# Patient Record
Sex: Male | Born: 1955 | Race: White | Hispanic: No | Smoking: Current every day smoker
Health system: Southern US, Community
[De-identification: ages and names within clinical notes are randomized; demographics above are authoritative.]

## PROBLEM LIST (undated history)

## (undated) DIAGNOSIS — E119 Type 2 diabetes mellitus without complications: Secondary | ICD-10-CM

## (undated) DIAGNOSIS — E7800 Pure hypercholesterolemia, unspecified: Secondary | ICD-10-CM

## (undated) DIAGNOSIS — F419 Anxiety disorder, unspecified: Secondary | ICD-10-CM

## (undated) DIAGNOSIS — I10 Essential (primary) hypertension: Secondary | ICD-10-CM

## (undated) DIAGNOSIS — M1990 Unspecified osteoarthritis, unspecified site: Secondary | ICD-10-CM

## (undated) DIAGNOSIS — I714 Abdominal aortic aneurysm, without rupture, unspecified: Secondary | ICD-10-CM

## (undated) DIAGNOSIS — N289 Disorder of kidney and ureter, unspecified: Secondary | ICD-10-CM

## (undated) DIAGNOSIS — J449 Chronic obstructive pulmonary disease, unspecified: Secondary | ICD-10-CM

## (undated) DIAGNOSIS — N200 Calculus of kidney: Secondary | ICD-10-CM

## (undated) DIAGNOSIS — Z8489 Family history of other specified conditions: Secondary | ICD-10-CM

## (undated) HISTORY — DX: Type 2 diabetes mellitus without complications: E11.9

## (undated) HISTORY — DX: Abdominal aortic aneurysm, without rupture, unspecified: I71.40

## (undated) HISTORY — PX: OTHER SURGICAL HISTORY: SHX169

---

## 2001-03-17 ENCOUNTER — Emergency Department (HOSPITAL_COMMUNITY): Admission: EM | Admit: 2001-03-17 | Discharge: 2001-03-17 | Payer: Self-pay

## 2001-08-16 ENCOUNTER — Emergency Department (HOSPITAL_COMMUNITY): Admission: EM | Admit: 2001-08-16 | Discharge: 2001-08-16 | Payer: Self-pay | Admitting: Emergency Medicine

## 2002-09-11 ENCOUNTER — Encounter: Payer: Self-pay | Admitting: Emergency Medicine

## 2002-09-11 ENCOUNTER — Emergency Department (HOSPITAL_COMMUNITY): Admission: EM | Admit: 2002-09-11 | Discharge: 2002-09-11 | Payer: Self-pay | Admitting: Emergency Medicine

## 2002-10-08 ENCOUNTER — Emergency Department (HOSPITAL_COMMUNITY): Admission: EM | Admit: 2002-10-08 | Discharge: 2002-10-08 | Payer: Self-pay | Admitting: Emergency Medicine

## 2002-10-29 ENCOUNTER — Emergency Department (HOSPITAL_COMMUNITY): Admission: EM | Admit: 2002-10-29 | Discharge: 2002-10-29 | Payer: Self-pay | Admitting: *Deleted

## 2002-10-29 ENCOUNTER — Encounter: Payer: Self-pay | Admitting: Emergency Medicine

## 2003-08-24 ENCOUNTER — Inpatient Hospital Stay (HOSPITAL_COMMUNITY): Admission: EM | Admit: 2003-08-24 | Discharge: 2003-09-01 | Payer: Self-pay | Admitting: Psychiatry

## 2004-03-05 ENCOUNTER — Emergency Department (HOSPITAL_COMMUNITY): Admission: EM | Admit: 2004-03-05 | Discharge: 2004-03-05 | Payer: Self-pay | Admitting: *Deleted

## 2004-08-31 ENCOUNTER — Emergency Department (HOSPITAL_COMMUNITY): Admission: EM | Admit: 2004-08-31 | Discharge: 2004-08-31 | Payer: Self-pay | Admitting: *Deleted

## 2005-01-08 ENCOUNTER — Emergency Department (HOSPITAL_COMMUNITY): Admission: EM | Admit: 2005-01-08 | Discharge: 2005-01-08 | Payer: Self-pay | Admitting: Emergency Medicine

## 2005-11-06 ENCOUNTER — Emergency Department (HOSPITAL_COMMUNITY): Admission: EM | Admit: 2005-11-06 | Discharge: 2005-11-07 | Payer: Self-pay | Admitting: Emergency Medicine

## 2006-09-22 ENCOUNTER — Emergency Department (HOSPITAL_COMMUNITY): Admission: EM | Admit: 2006-09-22 | Discharge: 2006-09-22 | Payer: Self-pay | Admitting: Emergency Medicine

## 2006-09-23 ENCOUNTER — Emergency Department (HOSPITAL_COMMUNITY): Admission: EM | Admit: 2006-09-23 | Discharge: 2006-09-23 | Payer: Self-pay | Admitting: Emergency Medicine

## 2006-10-04 ENCOUNTER — Emergency Department (HOSPITAL_COMMUNITY): Admission: EM | Admit: 2006-10-04 | Discharge: 2006-10-04 | Payer: Self-pay | Admitting: Emergency Medicine

## 2006-12-04 ENCOUNTER — Emergency Department (HOSPITAL_COMMUNITY): Admission: EM | Admit: 2006-12-04 | Discharge: 2006-12-04 | Payer: Self-pay | Admitting: Emergency Medicine

## 2007-07-10 ENCOUNTER — Emergency Department (HOSPITAL_COMMUNITY): Admission: EM | Admit: 2007-07-10 | Discharge: 2007-07-10 | Payer: Self-pay | Admitting: Emergency Medicine

## 2007-10-09 ENCOUNTER — Ambulatory Visit: Payer: Self-pay | Admitting: Cardiology

## 2007-10-09 ENCOUNTER — Inpatient Hospital Stay (HOSPITAL_COMMUNITY): Admission: EM | Admit: 2007-10-09 | Discharge: 2007-10-11 | Payer: Self-pay | Admitting: Family Medicine

## 2007-10-10 ENCOUNTER — Encounter (INDEPENDENT_AMBULATORY_CARE_PROVIDER_SITE_OTHER): Payer: Self-pay | Admitting: Emergency Medicine

## 2007-10-21 ENCOUNTER — Ambulatory Visit: Payer: Self-pay | Admitting: Family Medicine

## 2007-12-09 ENCOUNTER — Ambulatory Visit: Payer: Self-pay | Admitting: Internal Medicine

## 2007-12-09 ENCOUNTER — Ambulatory Visit: Payer: Self-pay | Admitting: Family Medicine

## 2007-12-09 LAB — CONVERTED CEMR LAB
AST: 13 units/L (ref 0–37)
Alkaline Phosphatase: 51 units/L (ref 39–117)
BUN: 15 mg/dL (ref 6–23)
Basophils Relative: 1 % (ref 0–1)
Creatinine, Ser: 0.89 mg/dL (ref 0.40–1.50)
Eosinophils Absolute: 0.5 10*3/uL (ref 0.0–0.7)
Eosinophils Relative: 5 % (ref 0–5)
HCT: 46.2 % (ref 39.0–52.0)
HDL: 41 mg/dL (ref >39)
Hemoglobin: 15.3 g/dL (ref 13.0–17.0)
LDL Cholesterol: 110 mg/dL — ABNORMAL HIGH (ref 0–99)
MCHC: 33.1 g/dL (ref 30.0–36.0)
MCV: 92 fL (ref 78.0–100.0)
Monocytes Absolute: 0.9 10*3/uL (ref 0.1–1.0)
Monocytes Relative: 11 % (ref 3–12)
RBC: 5.02 M/uL (ref 4.22–5.81)
TSH: 1.682 microintl units/mL (ref 0.350–4.50)
Total CHOL/HDL Ratio: 4.6
Triglycerides: 189 mg/dL — ABNORMAL HIGH (ref <150)

## 2008-04-13 ENCOUNTER — Emergency Department (HOSPITAL_COMMUNITY): Admission: EM | Admit: 2008-04-13 | Discharge: 2008-04-13 | Payer: Self-pay | Admitting: Emergency Medicine

## 2008-08-07 ENCOUNTER — Emergency Department (HOSPITAL_COMMUNITY): Admission: EM | Admit: 2008-08-07 | Discharge: 2008-08-07 | Payer: Self-pay | Admitting: Emergency Medicine

## 2008-08-08 ENCOUNTER — Emergency Department (HOSPITAL_COMMUNITY): Admission: EM | Admit: 2008-08-08 | Discharge: 2008-08-08 | Payer: Self-pay | Admitting: Emergency Medicine

## 2009-12-16 ENCOUNTER — Emergency Department (HOSPITAL_COMMUNITY): Admission: EM | Admit: 2009-12-16 | Discharge: 2009-12-16 | Payer: Self-pay | Admitting: Emergency Medicine

## 2009-12-20 ENCOUNTER — Emergency Department (HOSPITAL_COMMUNITY): Admission: EM | Admit: 2009-12-20 | Discharge: 2009-12-20 | Payer: Self-pay | Admitting: Emergency Medicine

## 2009-12-28 ENCOUNTER — Emergency Department (HOSPITAL_COMMUNITY): Admission: EM | Admit: 2009-12-28 | Discharge: 2009-12-28 | Payer: Self-pay | Admitting: Emergency Medicine

## 2010-07-22 LAB — POCT I-STAT, CHEM 8
BUN: 11 mg/dL (ref 6–23)
Calcium, Ion: 1.19 mmol/L (ref 1.12–1.32)
Chloride: 105 mEq/L (ref 96–112)
Creatinine, Ser: 0.8 mg/dL (ref 0.4–1.5)
Sodium: 137 mEq/L (ref 135–145)
TCO2: 26 mmol/L (ref 0–100)

## 2010-08-27 ENCOUNTER — Emergency Department (HOSPITAL_COMMUNITY)
Admission: EM | Admit: 2010-08-27 | Discharge: 2010-08-27 | Disposition: A | Discharge status: Home / Self Care | Payer: Self-pay | Attending: Emergency Medicine | Admitting: Emergency Medicine

## 2010-08-27 ENCOUNTER — Emergency Department (HOSPITAL_COMMUNITY): Payer: Self-pay

## 2010-08-27 DIAGNOSIS — N201 Calculus of ureter: Secondary | ICD-10-CM | POA: Insufficient documentation

## 2010-08-27 DIAGNOSIS — R109 Unspecified abdominal pain: Secondary | ICD-10-CM | POA: Insufficient documentation

## 2010-08-27 DIAGNOSIS — I10 Essential (primary) hypertension: Secondary | ICD-10-CM | POA: Insufficient documentation

## 2010-08-27 DIAGNOSIS — E7800 Pure hypercholesterolemia, unspecified: Secondary | ICD-10-CM | POA: Insufficient documentation

## 2010-08-27 DIAGNOSIS — J449 Chronic obstructive pulmonary disease, unspecified: Secondary | ICD-10-CM | POA: Insufficient documentation

## 2010-08-27 DIAGNOSIS — J4489 Other specified chronic obstructive pulmonary disease: Secondary | ICD-10-CM | POA: Insufficient documentation

## 2010-08-27 DIAGNOSIS — Z87442 Personal history of urinary calculi: Secondary | ICD-10-CM | POA: Insufficient documentation

## 2010-08-27 LAB — URINALYSIS, ROUTINE W REFLEX MICROSCOPIC
Glucose, UA: NEGATIVE mg/dL
Leukocytes, UA: NEGATIVE
Protein, ur: NEGATIVE mg/dL
pH: 5.5 (ref 5.0–8.0)

## 2010-08-27 LAB — URINE MICROSCOPIC-ADD ON

## 2010-08-30 ENCOUNTER — Emergency Department (HOSPITAL_COMMUNITY): Payer: Self-pay

## 2010-08-30 ENCOUNTER — Observation Stay (HOSPITAL_COMMUNITY)
Admission: EM | Admit: 2010-08-30 | Discharge: 2010-08-31 | Disposition: A | Discharge status: Home / Self Care | Payer: Self-pay | Attending: Urology | Admitting: Urology

## 2010-08-30 DIAGNOSIS — N201 Calculus of ureter: Principal | ICD-10-CM | POA: Insufficient documentation

## 2010-08-30 DIAGNOSIS — K5900 Constipation, unspecified: Secondary | ICD-10-CM | POA: Insufficient documentation

## 2010-08-30 DIAGNOSIS — I517 Cardiomegaly: Secondary | ICD-10-CM | POA: Insufficient documentation

## 2010-08-30 DIAGNOSIS — R110 Nausea: Secondary | ICD-10-CM | POA: Insufficient documentation

## 2010-08-30 DIAGNOSIS — E7800 Pure hypercholesterolemia, unspecified: Secondary | ICD-10-CM | POA: Insufficient documentation

## 2010-08-30 DIAGNOSIS — N1330 Unspecified hydronephrosis: Secondary | ICD-10-CM | POA: Insufficient documentation

## 2010-08-30 DIAGNOSIS — Z79899 Other long term (current) drug therapy: Secondary | ICD-10-CM | POA: Insufficient documentation

## 2010-08-30 DIAGNOSIS — R109 Unspecified abdominal pain: Secondary | ICD-10-CM | POA: Insufficient documentation

## 2010-08-30 DIAGNOSIS — N135 Crossing vessel and stricture of ureter without hydronephrosis: Secondary | ICD-10-CM | POA: Insufficient documentation

## 2010-08-30 DIAGNOSIS — I10 Essential (primary) hypertension: Secondary | ICD-10-CM | POA: Insufficient documentation

## 2010-08-30 LAB — DIFFERENTIAL
Basophils Absolute: 0.1 10*3/uL (ref 0.0–0.1)
Basophils Relative: 0 % (ref 0–1)
Lymphocytes Relative: 27 % (ref 12–46)
Monocytes Absolute: 2 10*3/uL — ABNORMAL HIGH (ref 0.1–1.0)
Neutro Abs: 7.8 10*3/uL — ABNORMAL HIGH (ref 1.7–7.7)
Neutrophils Relative %: 57 % (ref 43–77)

## 2010-08-30 LAB — SURGICAL PCR SCREEN: MRSA, PCR: NEGATIVE

## 2010-08-30 LAB — GLUCOSE, CAPILLARY: Glucose-Capillary: 96 mg/dL (ref 70–99)

## 2010-08-30 LAB — BASIC METABOLIC PANEL
CO2: 24 mEq/L (ref 19–32)
Calcium: 9.6 mg/dL (ref 8.4–10.5)
Chloride: 98 mEq/L (ref 96–112)
GFR calc Af Amer: 57 mL/min — ABNORMAL LOW (ref >60)
Glucose, Bld: 104 mg/dL — ABNORMAL HIGH (ref 70–99)
Potassium: 4 mEq/L (ref 3.5–5.1)
Sodium: 134 mEq/L — ABNORMAL LOW (ref 135–145)

## 2010-08-30 LAB — CBC
HCT: 42.7 % (ref 39.0–52.0)
Hemoglobin: 14.9 g/dL (ref 13.0–17.0)
RBC: 4.85 MIL/uL (ref 4.22–5.81)
WBC: 13.9 10*3/uL — ABNORMAL HIGH (ref 4.0–10.5)

## 2010-08-31 LAB — BASIC METABOLIC PANEL
BUN: 22 mg/dL (ref 6–23)
CO2: 27 mEq/L (ref 19–32)
Calcium: 9 mg/dL (ref 8.4–10.5)
Chloride: 104 mEq/L (ref 96–112)
Creatinine, Ser: 1.58 mg/dL — ABNORMAL HIGH (ref 0.4–1.5)
GFR calc Af Amer: 55 mL/min — ABNORMAL LOW (ref >60)
GFR calc non Af Amer: 46 mL/min — ABNORMAL LOW (ref >60)
Glucose, Bld: 110 mg/dL — ABNORMAL HIGH (ref 70–99)
Potassium: 4.6 mEq/L (ref 3.5–5.1)
Sodium: 138 mEq/L (ref 135–145)

## 2010-08-31 LAB — HEMOGLOBIN AND HEMATOCRIT, BLOOD
HCT: 40.1 % (ref 39.0–52.0)
Hemoglobin: 13.7 g/dL (ref 13.0–17.0)

## 2010-09-06 NOTE — Op Note (Signed)
NAME:  Dennis Dominguez, Dennis Dominguez             ACCOUNT NO.:  [id number]  MEDICAL RECORD NO.:  [id number]           PATIENT TYPE:  O  LOCATION:  1401                         FACILITY:  [facility]  PHYSICIAN:  Bertram Millard. Greysen Swanton, M.D.DATE OF BIRTH:  08/08/1955  DATE OF PROCEDURE:  08/30/2010 DATE OF DISCHARGE:                              OPERATIVE REPORT   PREOPERATIVE DIAGNOSIS:  Proximal left ureteral stone, 8 mm in size.  POSTOPERATIVE DIAGNOSES:  Proximal left ureteral stone, 8 mm in size.  PRINCIPAL PROCEDURES:  Cysto, left retrograde ureteropyelogram, attempted left ureteroscopy, double-J stent placed (6 x 26 contour without string).  SURGEON:  Bertram Millard. Elois Averitt, M.D.  ANESTHESIA:  General.  COMPLICATIONS:  None.  BRIEF HISTORY:  A 55 year old male who presented to the emergency room with recurring pain in his left flank.  CT of his abdomen revealed a proximal left ureteral stone, 8 mm in size with mild hydro.  As thepatient has recurrent pain, it was recommended that he undergo treatment for this.  He was admitted to the hospital for pain management, he subsequently presents at this time for his attempted stone extraction. Risks and complications have been discussed with the patient.  DESCRIPTION OF PROCEDURE:  The patient was identified in the holding area and received preoperative IV Cipro on the floor.  He was taken to the operating room where general anesthetic was administered.  He was placed in the dorsal lithotomy position.  Genitalia and perineum were prepped and draped.  Time-out was then performed.  22-French panendoscope was then passed through the urethra, which was normal.  Prostate was not obstructive.  Bladder was inspected circumferentially.  No tumors, trabeculations, or foreign bodies were seen.  The left ureter was cannulated with a 6-French open-ended catheter and retrograde performed.  Retrograde revealed a normal ureter except for a filling defect in  the proximal ureter at the level of L3 or L4.  There was mild hydronephrosis proximal to this.  A guidewire was then placed by this.  I attempted to pass ureteroscope after the cystoscope was removed.  I was unable to enter the ureter.  I then tried dilating the ureter with inner core of an access sheath, 12-French in size.  I was unable to get the access sheath past the ureteral orifice.  I tried multiple times and using fluoroscopic guidance, it was evident that I would not be able to dilate the ureter.  At this point, I then stop the attempts at dilating and then placed a 6-French x 26 cm contour stent.  I thought at this point it would be worthwhile either performing lithotripsy down the road or performing repeat ureteroscopy once the ureter has been dilated.  Good curls were seen proximally and distally on the stent when the guidewire was removed.  The bladder was drained.  The procedure terminated.  The patient tolerated procedure well.  He was awakened and taken to PACU in stable condition.     Bertram Millard. Retta Diones, M.D.     SMD/MEDQ  D:  08/30/2010  T:  08/31/2010  Job:  [id number]  Electronically Signed by Marcine Matar M.D. on 09/06/2010 12:00:34  PM

## 2010-09-06 NOTE — Discharge Summary (Signed)
  NAME:  Dennis Dominguez, Dennis Dominguez             ACCOUNT NO.:  [id number]  MEDICAL RECORD NO.:  [id number]           PATIENT TYPE:  O  LOCATION:  1401                         FACILITY:  [facility]  PHYSICIAN:  Bertram Millard. Treasure Ingrum, M.D.DATE OF BIRTH:  [birth year]  DATE OF ADMISSION:  08/30/2010 DATE OF DISCHARGE:  08/31/2010                              DISCHARGE SUMMARY   PRINCIPAL PROCEDURE:  August 30, 2010, cysto, attempted left ureteroscopy, double-J stent placement on the left.  OTHER DIAGNOSES:  Hypercholesterolemia, possible history of hypertension.  BRIEF HISTORY:  This is a 55 year old male who was admitted through the emergency room due to significant recurrent pain from a kidney stone on the left.  He has an 8-mm proximal left ureteral stone.  He was unable to be treated as an outpatient with pain medicine.  He was admitted for pain management and possible stone management.  For the admission data, please see the dictated admission note.  He did have an EKG which showed normal sinus rhythm.  Chest x-ray revealed stable mild cardiomegaly with stable chronic bronchitis and/or asthma.  HOSPITAL COURSE:  The patient was admitted to my service.  He, on the day of admission, underwent cystoscopy, left retrograde ureteropyelogram, attempted left ureteroscopy.  He had a significant narrowing of the left distal ureter which precluded passage of a scope or ureteral dilatation.  I then placed a 6-French double-J stent.  It was felt that the patient will need followup ureteroscopy after the stent passively dilates his ureter.  He did well postoperatively and was discharged on postoperative day #1.  At that time, he was discharged on Vicodin 1 p.o. q.4 h p.r.n. pain, Ditropan 1 p.o. q.8 h p.r.n. urinary frequency, and Cipro 250 mg 1 p.o. b.i.d. for 3 days.  It was recommended that he call my office within the next week for followup and to schedule his procedure.     Bertram Millard. Retta Diones,  M.D.     SMD/MEDQ  D:  08/31/2010  T:  08/31/2010  Job:  [id number]  Electronically Signed by Marcine Matar M.D. on 09/06/2010 12:00:40 PM

## 2010-09-06 NOTE — Consult Note (Signed)
NAME:  Dennis Dominguez, Dennis Dominguez             ACCOUNT NO.:  [id number]  MEDICAL RECORD NO.:  [id number]           PATIENT TYPE:  O  LOCATION:  1401                         FACILITY:  [facility]  PHYSICIAN:  Bertram Millard. Azariah Bonura, M.D.DATE OF BIRTH:  12-Aug-1955  DATE OF CONSULTATION:  08/30/2010 DATE OF DISCHARGE:                                CONSULTATION   REASON FOR CONSULTATION:  Left flank pain and left abdominal pain secondary to obstructing left mid ureteral 8-mm calculus.  HISTORY OF PRESENT ILLNESS:  This is a 55 year old gentleman who began having pain approximately 4 days ago.  His pain was unrelenting therefore he presented to the Emergency Room for pain control.  He was discharged home with pain medications at that time.  He was diagnosed with an obstructing 8-mm left mid ureteral calculus.  His pain waxed and waned over the course of the next 4 days.  This pain began increasing in intensity this a.m. without control with his narcotic pain medications. It was also accompanied with nausea and fever and chills.  He denies any vomiting or diarrhea.  He does complain of occasional urinary frequency and occasional nocturia.  He denies any complaints of dysuria or hematuria.  He denies any complaints of chest pain or shortness of breath.  He does complain of constipation secondary to his narcotic pain medications.  PAST MEDICAL HISTORY: 1. Hypercholesterolemia. 2. Hypertension. 3. Nephrolithiasis.  PAST SURGICAL HISTORY:  There is none.  MEDICATIONS: 1. Zocor 80 mg once daily. 2. Percocet 5/325 as needed for pain.  ALLERGIES:  PENICILLIN with reaction of swelling and hives.  FAMILY HISTORY:  He denies any family history of kidney cancer, bladder cancer or prostate cancer.  He does have a family history of nephrolithiasis with his father and hypercholesterolemia with his father.  SOCIAL HISTORY:  He lives alone in [place].  He denies any alcohol use.  He does use tobacco and  occasional cannabis.  His last use of cannabis was approximately 3 days ago.  REVIEW OF SYSTEMS:  As stated per HPI.  Positive complaints of left flank pain and left abdominal pain, occasional fever and chills, nausea and urinary frequency as well as constipation.  He denies any vomiting, diarrhea, chest pain or shortness of breath.  He denies any dysuria or hematuria.  PHYSICAL EXAMINATION:  VITAL SIGNS:  Temperature 98.2, pulse 90, respirations 70, blood pressure 119/84. CONSTITUTIONAL:  He is a well-developed, well-nourished obese white male, in no acute distress although somewhat anxious. HEENT:  Normocephalic, atraumatic.  Oropharynx is clear. CARDIOVASCULAR:  Regular rate and rhythm. LUNGS:  Clear to auscultation. ABDOMEN:  Mild left lower and upper quadrant tenderness with left CVA tenderness.  Positive bowel sounds x4. EXTREMITIES:  Nontender.  No atrophy NEUROLOGIC:  His remote and recent memory is intact. SKIN:  Warm, dry and intact.  RADIOLOGY:  A CT of abdomen and pelvis from August 30, 2010 showed: 1. 8-mm obstructing calculus in mid left ureter at the L4-L5 disk     space. 2. Mild left hydroureteronephrosis. 3. Mild perinephric stranding.  LABORATORY DATA:  Sodium is 134, potassium 4.0, chloride 98, CO2 of 24, BUN  13, creatinine 1.54, glucose is 104.  WBC is 13.9, hemoglobin is 14.9, hematocrit 42.7, platelets 383,000.  Urinalysis from August 27, 2010, specific gravity is 1.015, pH 5.5, blood small amount.  The rest of the dipstick negative and urine micro from August 27, 2010 shows WBCs of 0-2.  IMPRESSION AND PLAN: 1. Hydroureteronephrosis secondary to obstructing left mid ureteral 8     mm calculus. 2. Mild perinephric stranding.  I will plan to admit patient for     surgical procedure this afternoon for Dr. Marcine Matar     consisting of cystoscopy, left retrograde pyelography with     placement of left double-J stent.  We will keep him overnight for      intravenous hydration and antibiotic therapy.  Will possibly be     discharged home in a.m. with good outcome of procedure today.  We     will at that time have him follow up for definitive stone treatment     on an outpatient basis.     Delia Chimes, NP   ______________________________ Bertram Millard. Joncarlo Friberg, M.D.    MA/MEDQ  D:  08/30/2010  T:  08/30/2010  Job:  [id number]  Electronically Signed by Delia Chimes NP on 09/01/2010 11:12:21 AM Electronically Signed by Marcine Matar M.D. on 09/06/2010 11:59:42 AM

## 2010-09-12 ENCOUNTER — Other Ambulatory Visit: Payer: Self-pay | Admitting: Urology

## 2010-09-12 ENCOUNTER — Encounter (HOSPITAL_COMMUNITY): Payer: Self-pay | Attending: Urology

## 2010-09-12 DIAGNOSIS — K219 Gastro-esophageal reflux disease without esophagitis: Secondary | ICD-10-CM | POA: Insufficient documentation

## 2010-09-12 DIAGNOSIS — I10 Essential (primary) hypertension: Secondary | ICD-10-CM | POA: Insufficient documentation

## 2010-09-12 DIAGNOSIS — G4733 Obstructive sleep apnea (adult) (pediatric): Secondary | ICD-10-CM | POA: Insufficient documentation

## 2010-09-12 DIAGNOSIS — N201 Calculus of ureter: Secondary | ICD-10-CM | POA: Insufficient documentation

## 2010-09-12 DIAGNOSIS — Z01812 Encounter for preprocedural laboratory examination: Secondary | ICD-10-CM | POA: Insufficient documentation

## 2010-09-12 DIAGNOSIS — Z79899 Other long term (current) drug therapy: Secondary | ICD-10-CM | POA: Insufficient documentation

## 2010-09-12 LAB — CBC
Hemoglobin: 14.6 g/dL (ref 13.0–17.0)
MCH: 30.6 pg (ref 26.0–34.0)
MCHC: 34.8 g/dL (ref 30.0–36.0)
Platelets: 468 10*3/uL — ABNORMAL HIGH (ref 150–400)
RBC: 4.77 MIL/uL (ref 4.22–5.81)

## 2010-09-12 LAB — BASIC METABOLIC PANEL
BUN: 15 mg/dL (ref 6–23)
CO2: 23 mEq/L (ref 19–32)
GFR calc Af Amer: >60 mL/min (ref >60)
GFR calc non Af Amer: >60 mL/min (ref >60)
Glucose, Bld: 84 mg/dL (ref 70–99)
Sodium: 135 mEq/L (ref 135–145)

## 2010-09-16 ENCOUNTER — Ambulatory Visit (HOSPITAL_COMMUNITY)
Admission: RE | Admit: 2010-09-16 | Discharge: 2010-09-17 | Disposition: A | Discharge status: Home / Self Care | Payer: Self-pay | Source: Ambulatory Visit | Attending: Urology | Admitting: Urology

## 2010-09-16 DIAGNOSIS — I10 Essential (primary) hypertension: Secondary | ICD-10-CM | POA: Insufficient documentation

## 2010-09-16 DIAGNOSIS — G4733 Obstructive sleep apnea (adult) (pediatric): Secondary | ICD-10-CM | POA: Insufficient documentation

## 2010-09-16 DIAGNOSIS — N201 Calculus of ureter: Secondary | ICD-10-CM | POA: Insufficient documentation

## 2010-09-16 DIAGNOSIS — Z79899 Other long term (current) drug therapy: Secondary | ICD-10-CM | POA: Insufficient documentation

## 2010-09-16 DIAGNOSIS — F17200 Nicotine dependence, unspecified, uncomplicated: Secondary | ICD-10-CM | POA: Insufficient documentation

## 2010-09-16 DIAGNOSIS — E7800 Pure hypercholesterolemia, unspecified: Secondary | ICD-10-CM | POA: Insufficient documentation

## 2010-09-16 DIAGNOSIS — K219 Gastro-esophageal reflux disease without esophagitis: Secondary | ICD-10-CM | POA: Insufficient documentation

## 2010-09-20 NOTE — Discharge Summary (Signed)
NAME:  Dennis Dominguez, Dennis Dominguez NO.:  [id number]   MEDICAL RECORD NO.:  [id number]          PATIENT TYPE:  INP   LOCATION:  4704                         FACILITY:  [facility]   PHYSICIAN:  Michaelyn Barter, M.D. DATE OF BIRTH:  1955-07-22   DATE OF ADMISSION:  10/09/2007  DATE OF DISCHARGE:  10/11/2007                               DISCHARGE SUMMARY   FINAL DIAGNOSES:  1. Chest pain.  2. Hyperlipidemia.  3. Hypertension.   CONSULTATIONS:  Cardiology with [place].   PROCEDURES:  1. Two-D echocardiogram completed October 11, 2007.  2. Two view chest x-ray completed October 09, 2007.   HISTORY OF PRESENT ILLNESS:  Mr. Bury is a 55 year old gentleman who  indicated that over the last 3 months he had been having heaviness over  the left side of his chest.  He also complained of some centrally  located sharp chest pain.  He indicated that when he has the heavy  sensation over the left side of his chest it is occasionally accompanied  by left forearm discomfort.  He also has some occasional shortness of  breath.  Over the past 3 months his chest pressure has been feeling as  though it is progressing.  He has become progressively more short of  breath with ambulation particularly when walking up stairs or an  incline.   PAST MEDICAL HISTORY:  For past medical history please see that dictated  by Dr. Michaelyn Barter.   HOSPITAL COURSE:  1. Chest pain.  A chest x-ray was completed on June 3.  It revealed      mild cardiomegaly.  Chronic lung changes, and nrdro active disease.      The patient's EKG revealed sinus tachycardia with only questionable      changes in V1.  A repeat EKG completed June 3rd showed only      questionable findings.  Because of the patient's history on      presentation [facility] was consulted.  Dr. Charlies Constable      saw the patient.  A 2-D echo was completed on October 10, 2007, the      overall left ventricular systolic function was normal.  Left   ventricular EF was estimated to range between 55 and 60%.  No      diagnostic evidence of left ventricular regional wall motion      abnormalities were seen.  [facility] evaluated the patient      and their final impression was that no further cardiac workup      needed to take place.  The patient has not complained of any chest      pain or shortness of breath throughout his hospital course.  2. Hyperlipidemia.  A fasting lipid profile was completed on June 4.      The patient's cholesterol was noted to be 174.  His triglycerides      were 322.  His LDL was 86.  Zocor was started.  3. Homelessness.  Social work has been consulted with regards to this.      The patient has indicated that he does have a  facility or shelter      to return to once he is discharged from the hospital.  The decision      has been made to discharge the patient from the hospital.   DISCHARGE MEDICATIONS:  1. His medications at the time of discharge will consist of aspirin 81      mg daily.  2. Zocor 20 mg daily.  3. Albuterol inhaler 2 puffs q.6 h p.r.n.  4. Ambien 5 mg p.o. daily at bedtime p.r.n.      Michaelyn Barter, M.D.  Electronically Signed     OR/MEDQ  D:  10/11/2007  T:  10/11/2007  Job:  [id number]

## 2010-09-20 NOTE — H&P (Signed)
NAME:  Dennis Dominguez NO.:  [id number]   MEDICAL RECORD NO.:  [id number]          PATIENT TYPE:  INP   LOCATION:  1832                         FACILITY:  [facility]   PHYSICIAN:  Michaelyn Barter, M.D. DATE OF BIRTH:  March 04, 1956   DATE OF ADMISSION:  10/09/2007  DATE OF DISCHARGE:                              HISTORY & PHYSICAL   The patient's primary care doctor is unassigned.   CHIEF COMPLAINT:  Chest pain.   HISTORY OF PRESENT ILLNESS:  Mr. Dennis Dominguez is a 55 year old homeless  gentleman who indicates that for at least 3 months he has been having  heaviness over the left side of his chest.  He also complains of some  centrally located sharp chest pain.  He indicated that when he has the  heavy sensation over the left side of his chest occasionally it is  accompanied by left forearm discomfort.  He also has some occasional  shortness of breath.  He states that over the past 3 months his chest  pressure has been feeling as though it is getting worse.  He also  indicates that he has become progressively more short of breath with  ambulation, particularly when walking up stairs or up an incline over  the past 3 months.  There are no triggers.  Again he complains in  addition to having left chest pressure centrally located sharp pains  which he states have reached up to a 6/10 in intensity.  He denies there  being any triggers for the pressure or the pain.  There are no  alleviating factors identified.  The  pressure and the pain can occur  off and on every day.  There has been no diaphoresis, but he has had  some nausea.  No fevers or chills.   PAST MEDICAL HISTORY:  1. Depression.  2. History of suicidal ideation.  3. An admission into [facility] April 2005.   SURGICAL HISTORY:  None.   ALLERGIES:  PENICILLIN CAUSED THE PATIENT TO HAVE GENERALIZED SWELLING  AS WELL AS SHORTNESS OF BREATH.   HOME MEDICATIONS:  None.   SOCIAL HISTORY:  1. Cigarettes.   The patient started smoking at the age of 74.  He      smoked up to 2 packs cigarettes per day.  He indicates that over      the past year he has been able to decrease the number of cigarettes      smoked to 1 pack per day.  2. Alcohol.  The patient admits to having a history of alcohol abuse      that occurred greater than 2 years ago.  3. Crack cocaine.  The patient openly admits to having used crack      cocaine in the past.  He states that the last time he used crack      cocaine was back in 1999.  4. Acid.  The patient openly admits to using acid back in the 1970s.  5. Marijuana.  The patient states that he has occasionally smoked      marijuana.  He also openly admits to being homeless  currently.   FAMILY HISTORY:  Father had angina and renal failure.  Mother had a CVA,  heart disease.  Paternal grandfather had an MI.   REVIEW OF SYSTEMS:  As per HPI.   PHYSICAL EXAMINATION:  GENERAL:  The patient is awake.  He is  cooperative.  He is in no obvious respiratory distress.  VITAL SIGNS:  Temperature 97.7, blood pressure 150/93, heart rate 80,  respirations 24, O2 sat 97%.  HEENT:  Normocephalic, atraumatic.  Anicteric.  Extraocular movements  are intact.  Oral mucosa is pink.  No thrush, no exudates.  NECK:  No JVD, no lymphadenopathy, no thyromegaly.  Supple.  CARDIAC:  S1 and S2 are present.  Regular rate and rhythm.  No murmurs,  gallops or rubs auscultated.  No parasternal heave.  PMI is difficult to  appreciate.  ABDOMEN:  Soft, nontender, nondistended.  No masses palpated.  Bowel  sounds are present although somewhat hypoactive.  EXTREMITIES:  Trace bilateral leg edema.  NEUROLOGIC:  The patient is alert and oriented x3.  MUSCULOSKELETAL:  There is 5/5 upper and lower extremity strength.   LABORATORIES:  Cardiac markers, CK-MB, POC 1.2, troponin I, POC less  than 0.05.  White blood cell count was 9.2, hemoglobin 14.5, hematocrit  42.4, platelet count 329.  Sodium 138,  potassium 4.4, chloride 105,  glucose 94, BUN 14, creatinine 0.9.  A chest x-ray shows mild  cardiomegaly, chronic lung changes.  No active disease.  EKG was  completed.  It revealed normal sinus rhythm.  No pathologic Q waves, no  ST segment abnormalities.   ASSESSMENT/PLAN:  1. Chest pain.  The etiology of this is cardiac versus noncardiac.      Will admit the patient into the hospital in order to rule him out      for myocardial infarction.  Will cycle his troponin I and CK-MB x3      q.8 h apart.  Will provide the patient with p.r.n. morphine,      oxygen, nitroglycerin and an aspirin.  In light of mild      cardiomegaly being present on chest x-ray, we will order a 2-D      echocardiogram to assess his overall EF.  2. Nicotine addiction.  We will provide the patient with a nicotine      patch.  3. Homelessness.  We will ask social work to help with regards to      discharge planning.  4. Gastrointestinal prophylaxis.  We will provide Protonix.  5. Deep venous thrombosis prophylaxis.  We will provide Lovenox.      Michaelyn Barter, M.D.  Electronically Signed     OR/MEDQ  D:  10/09/2007  T:  10/09/2007  Job:  [id number]

## 2010-09-20 NOTE — Consult Note (Signed)
NAME:  Dennis Dominguez, Dennis Dominguez             ACCOUNT NO.:  [id number]   MEDICAL RECORD NO.:  [id number]          PATIENT TYPE:  INP   LOCATION:  4704                         FACILITY:  [facility]   PHYSICIAN:  Everardo Beals. Juanda Chance, MD, FACCDATE OF BIRTH:  03/09/1956   DATE OF CONSULTATION:  DATE OF DISCHARGE:                                 CONSULTATION   SUMMARY OF HISTORY:  Mr. Begue is a 55 year old white male who was  admitted through [facility] emergency room by [facility] secondary  to chest discomfort and hypertension.  [facility] nurse stating that he needed medications for his  heart and breathing problems; however, he was referred to the emergency  room for evaluation for his blood pressure.  His blood pressure at the  clinic where he was at was unknown; however, at the time of admission to  the emergency room his blood pressure was 150/93.  We were asked to see  the patient secondary to chest discomfort.   Mr. Brander describes a 81-month history of intermittent chest  heaviness.  He states that the last first time it has been zero has been  today.  Otherwise, he had some type of chest heaviness for the preceding  3 months.  He states when it is light it is a 4 on a scale of 0-10.  With it is a little heavier, it is 6-7 on a 0-10.  He cannot elaborate  on specific alleviating aggravating factors except for the information  that it has been there for 3 months.  He also describes a sharp shooting  pain from the right side of his chest to the left sided that just last  seconds.  He states that this might occur 10-15 times a week and occurs  at any time, rest, exertion, and nocturnally.  He cannot describe any  alleviating or aggravating factors.  He also admits to at least a year  and half of dyspnea on exertion and feels that this been worse over the  last 2-3 months.  He also describes at least a 20-pound weight gain over  the last couple of  months.   PAST MEDICAL HISTORY:  Allergy to PENICILLIN.   Medications prior to admission include aspirin 325 daily and Benadryl  p.r.n.   [facility]  secondary to depression with EtOH and passive suicidal ideation.  The  patient does describe a stress Myoview in the 90s by a cardiologist in  [place] and was told that it was okay.  He denies any surgeries.  He  denies any diagnoses of diabetes, myocardial infarction, CVA,  hypertension, COPD, bleeding dyscrasias, thyroid dysfunction, or kidney  problems; however, he has not been to a regular physician in quite a  while.   SOCIAL HISTORY:  The patient resides in [place]; however, he is  homeless and occasionally he will stay with friends during rainstorms.  He does not have any children.  He used to be employed in [employer], but has not worked for  1-1/2 years.  He has been divorced  since 1984 and does not have any children.  He continues to smoke and is  down from 2 packs a day to 1 pack per day and has been smoking for 35  years.  He states that he has not had any alcohol in 1 year.  He has not  used any cocaine since 99 or acid since the 70s.  It is not clear how  often he uses marijuana.  He denies any specific diet.  In fact, he  states that he eats at [employer] quite at bit.  He denies exercise.   Family history is notable for the death of his mother at the age of 41  with a history of CVA and hypertension.  His father committed suicide at  the age of 73 and had an alcohol problem.  He has 1 brother who resides  in [place] of unknown health.   REVIEW OF SYSTEMS:  In addition to the above is notable for occasional  headache, nasal congestion, and reading glasses.  He states that he only  has 4 teeth left.  He has chronic orthopnea and PND and wheezing.  He  states that he does snore.  He has never been evaluated for sleep apnea.  He also describes  nocturia of generalized weakness and fatigue,  depression, and he states a long time ago he had some bright red blood  on the tissue paper secondary to hemorrhage and describes constipation.  All other systems are unremarkable.   PHYSICAL EXAM:  GENERAL:  Well-nourished, well-developed pleasant obese  white male in no apparent distress.  VITAL SIGNS:  Admission weight was 121.8 kg.  Temperature is 97.4, blood  pressure is now 133/93, pulse 71, respirations 17, and 97% sat on room  air.  Telemetry shows normal sinus rhythm without ectopy.  HEENT:  Unremarkable except for poor dentition.  NECK:  Supple without thyromegaly, adenopathy, JVD, or carotid bruits.  CHEST:  Symmetrical excursion.  Lung sounds were diminished, but I did  not appreciate any rhonchi, rales, or wheezing.  He does have prolonged  expiration with pseudo-wheeze.  HEART:  PMI is not displaced.  Regular rate and rhythm with normal S1  and S2.  I do not appreciate any murmurs, rubs, clicks, or gallops.  All  pulses are symmetrical and intact without abdominal bruits.  SKIN:  Integument appears to be intact.  ABDOMEN:  Obese umbilical hernia which is easily reducible.  Bowel  sounds present without organomegaly, masses, or tenderness.  EXTREMITIES:  Negative cyanosis, clubbing, or edema.  MUSCULOSKELETAL AND NEURO:  Unremarkable.   Chest x-ray shows mild cardiomegaly, chronic lung changes, no active  disease.  An EKG in the ER shows normal sinus rhythm, nonspecific ST-T  wave changes.  No essential change from June 2007.  H&H is 14.5 and  42.4.  Normal indices, platelets 329,000, and WBCs 9.2.  An iSTAT showed  a sodium of 138, potassium 4.4, BUN 14, creatinine 0.9, glucose 94.  CK-  MBs relative indexes have been within normal limits x3.  His total  cholesterol is 174, triglycerides 322, HDL 24, and LDL 86.   IMPRESSION:  1. Prolonged and atypical chest discomfort.  He has 2 different types      of chest discomfort  neither which found by history cardiac.      Enzymes and EKGs have been negative for acute ischemia.  2. Untreated hypertension.  3. Tobacco use.  4. 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  5. Multiple social issues.  6. Hyperlipidemia.   DISPOSITION:  Dr. Juanda Chance reviewed the patient's history, spoke with and  examined the patient, and agrees with the above.  Currently, an  echocardiogram and urine drug screen are pending.  Dr. Juanda Chance agrees  that his discomfort is very atypical and not likely cardiac in etiology.  If his echocardiogram is unremarkable, we would not pursue further  workup; however, it shows wall motion abnormalities or a decreased EF.  We will consider further evaluation and we will re-evaluate.  Given his  multiple social issues, treatment for his hypertension and  hyperlipidemia will need to be closely followed he right.  Tobacco  cessation was also advised.      Joellyn Rued, PA-C      Bruce R. Juanda Chance, MD, [facility]  Electronically Signed    EW/MEDQ  D:  10/10/2007  T:  10/11/2007  Job:  [id number]   cc:   [facility]

## 2010-09-23 NOTE — Discharge Summary (Signed)
NAME:  Dennis Dominguez, Dennis Dominguez                       ACCOUNT NO.:  [id number]   MEDICAL RECORD NO.:  [id number]                   PATIENT TYPE:  IPS   LOCATION:  0505                                 [facility]ACILITY[facility]  [facility]   PHYSICIAN:  Jeanice Lim, M.D.              DATE OF BIRTH:  Jul 07, 1955   DATE OF ADMISSION:  08/24/2003  DATE OF DISCHARGE:  09/01/2003                                 DISCHARGE SUMMARY   IDENTIFYING DATA:  This is a 55 year old divorced Caucasian male,  voluntarily admitted with history of depression and alcohol abuse, drinking  heavily the last 2 weeks, reporting what's the point?  Now describing  passive suicidal ideation, sleeping too much.  Girlfriend had left him after  8 months.  Long history of alcohol dependency.  First [facility] admission.   ADMISSION MEDICATIONS:  None.   ALLERGIES:  PENICILLIN.   PHYSICAL EXAMINATION:  Within normal limits, neurologically nonfocal.   ROUTINE ADMISSION LABS:  Within normal limits.   MENTAL STATUS EXAM:  Alert middle-aged male, cooperative, fair eye contact.  Speech clear, mood depressed, affect teary eyed, thought process goal  directed, thought content negative for dangerous ideation or psychotic  symptoms.  Judgment and insight were fair to poor.   ADMISSION DIAGNOSES:   AXIS I:  1. Depressive disorder not otherwise specified.  2. Rule out alcohol-induced mood disorder.  3. Alcohol dependence.   AXIS II:  Deferred.   AXIS III:  Back pain and nicotine dependence.   AXIS IV:  Moderate stressors, including housing problems, other psychosocial  issues.   AXIS V:  35/60.   HOSPITAL COURSE:  The patient was admitted and ordered routine p.r.n.  medications, underwent further monitoring, and was encouraged to participate  in individual, group and milieu therapy.  The patient wanted a 28 day  program, was explained the options including the cost of [facility].  The  patient wanted to go outside of  [place], complained of panic attacks  initially and significant withdrawal symptoms as well as fleeting suicidal  ideation which gradually resolved as he was stabilized on medications.  He  was detoxed without complications.  Seroquel was optimized to restore sleep  and his mood was treated.  The patient reported improving, responding to  clinical interventions.  Condition at discharge was significantly improved.  He was much less anxious.  No acute withdrawal symptoms.  Mood was mostly  euthymic and affect brighter and the patient reported motivation to remain  sober and compliant with the aftercare plan.  He was given medication  education.   DISCHARGE MEDICATIONS:  1. Cymbalta 30 mg q.a.m.  2. Claritin 10 mg q.a.m.  3. Trazodone 100 mg 2 q.h.s. p.r.n. insomnia.  4. Neurontin 300 mg q.i.d.  5. Seroquel 100 mg q.i.d.   DISPOSITION:  The patient is to follow up with [facility].   DISCHARGE DIAGNOSES:   AXIS  I:  1. Depressive disorder not otherwise specified.  2. Rule out alcohol-induced mood disorder.  3. Alcohol dependence.   AXIS II:  Deferred.   AXIS III:  Back pain and nicotine dependence.   AXIS IV:  Moderate stressors, including housing problems, other psychosocial  issues.   AXIS V:  Global assessment of function on discharge was 55-60.                                               Jeanice Lim, M.D.    JEM/MEDQ  D:  09/23/2003  T:  09/24/2003  Job:  [id number]

## 2010-09-23 NOTE — H&P (Signed)
NAME:  Dennis Dominguez, Dennis Dominguez                       ACCOUNT NO.:  [id number]   MEDICAL RECORD NO.:  [id number]                   PATIENT TYPE:  IPS   LOCATION:  0505                                 [facility]ILITY[facility]  [facility]   PHYSICIAN:  Geoffery Lyons, M.D.                   DATE OF BIRTH:  1955-06-13   DATE OF ADMISSION:  08/24/2003  DATE OF DISCHARGE:                         PSYCHIATRIC ADMISSION ASSESSMENT   IDENTIFYING INFORMATION:  This is a 56 year old divorced white male  voluntarily admitted on August 24, 2003.   HISTORY OF PRESENT ILLNESS:  The patient presents with a history of  depression and alcohol abuse.  Has been drinking heavily for the last two  weeks and feeling very depressed, thinking what's the point.  He has been  sleeping too much.  He has been drinking while he gets up and then he goes  back to sleep.  He states he is almost 55 years old and what have I done in  my life.  He states his girlfriend left him after eight months and feels he  can no longer trust anybody.  He denies any suicidal or homicidal ideation  or psychosis and reports a long history of alcohol use.   PAST PSYCHIATRIC HISTORY:  First admission to [facility].  Is  sponsored by [facility] for four days.  His longest  history of sobriety has been one year, that was in 1984.  States he has been  detoxed a few times and has no current outpatient treatment.   SOCIAL HISTORY:  This is a 55 year old divorced white male, divorced since  62.  No children.  He considers himself old.  He is not working and states  his girlfriend of eight months took off with someone else.   FAMILY HISTORY:  States his father also was a drinker.   ALCOHOL/DRUG HISTORY:  The patient smokes.  He has been drinking 6-7 drinks  of vodka every day, 12 beers daily, drinks in the morning.  Denies any  current drug use.   PRIMARY CARE PHYSICIAN:  None.   MEDICAL PROBLEMS:  Back problems.   MEDICATIONS:   None.   ALLERGIES:  PENICILLIN.   PHYSICAL EXAMINATION:  GENERAL:  This is a middle-aged male, anxious,  somewhat unkempt.  NECK:  Negative lymphadenopathy.  LUNGS:  He has some expiratory wheezing and rhonchi that clears with cough.  HEART:  Regular rate and rhythm.  ABDOMEN:  Soft, nontender.  NEUROLOGIC:  Nonfocal.  No tremors were noted.  VITAL SIGNS:  Stable.  Temperature 97.3, heart rate 81, respirations 22,  blood pressure 114/72.   LABORATORY DATA:  CBC within normal limits.  SGPT 50.  TSH 2.156.  Urinalysis was negative.   MENTAL STATUS EXAM:  Alert, middle-aged male.  Cooperative.  Fair eye  contact.  Speech is clear.  The patient feels depressed and hopeless.  Affect is teary-eyed.  Thought processes  are coherent.  There is no evidence  of psychosis.  Cognitive function intact.  Memory is fair.  Judgment is  fair.  Insight is fair.  Poor impulse control.   DIAGNOSES:   AXIS I:  1. Depressive disorder not otherwise specified.  2. Alcohol dependence.   AXIS II:  Deferred.   AXIS III:  1. Back pain.  2. Tobacco abuse.   AXIS IV:  Problems with housing, primary support group, other psychosocial  problems.   AXIS V:  Current 35; past year 62.   PLAN:  Admission for alcohol dependence and passive suicidal thoughts.  Contract for safety.  Stabilize mood and thinking.  Will detox safely.  Will  encourage fluids.  Will initiate an antidepressant.  Risks and benefits were  discussed.  Tobacco cessation was discussed.  Relapse prevention to be done  while patient is here.  The patient is to attend all groups.  [place] is  to look at potential rehab and housing situation.   TENTATIVE LENGTH OF STAY:  Four days.     Landry Corporal, N.P.                       Geoffery Lyons, M.D.    JO/MEDQ  D:  08/26/2003  T:  08/26/2003  Job:  [id number]

## 2010-09-28 ENCOUNTER — Emergency Department (HOSPITAL_COMMUNITY)
Admission: EM | Admit: 2010-09-28 | Discharge: 2010-09-28 | Disposition: A | Discharge status: Home / Self Care | Payer: Self-pay | Attending: Emergency Medicine | Admitting: Emergency Medicine

## 2010-09-28 ENCOUNTER — Emergency Department (HOSPITAL_COMMUNITY): Payer: Self-pay

## 2010-09-28 DIAGNOSIS — E7800 Pure hypercholesterolemia, unspecified: Secondary | ICD-10-CM | POA: Insufficient documentation

## 2010-09-28 DIAGNOSIS — N200 Calculus of kidney: Secondary | ICD-10-CM | POA: Insufficient documentation

## 2010-09-28 DIAGNOSIS — M549 Dorsalgia, unspecified: Secondary | ICD-10-CM | POA: Insufficient documentation

## 2010-09-28 DIAGNOSIS — I10 Essential (primary) hypertension: Secondary | ICD-10-CM | POA: Insufficient documentation

## 2010-09-28 DIAGNOSIS — R109 Unspecified abdominal pain: Secondary | ICD-10-CM | POA: Insufficient documentation

## 2010-09-28 LAB — URINALYSIS, ROUTINE W REFLEX MICROSCOPIC
Glucose, UA: NEGATIVE mg/dL
Hgb urine dipstick: NEGATIVE
Protein, ur: NEGATIVE mg/dL
Specific Gravity, Urine: 1.023 (ref 1.005–1.030)
pH: 5.5 (ref 5.0–8.0)

## 2010-10-05 NOTE — Op Note (Signed)
NAME:  Dennis Dominguez, Dennis Dominguez NO.:  [id number]  MEDICAL RECORD NO.:  [id number]           PATIENT TYPE:  O  LOCATION:  DAYL                         FAC[facility]  [facility]  PHYSICIAN:  Bertram Millard. Duc Crocket, M.D.DATE OF BIRTH:  1956-04-23  DATE OF PROCEDURE:  09/16/2010 DATE OF DISCHARGE:                              OPERATIVE REPORT   PREOPERATIVE DIAGNOSIS:  8-mm left mid ureteral stone.  POSTOPERATIVE DIAGNOSIS:  8-mm left mid ureteral stone.  PRINCIPAL PROCEDURE:  Cystoscopy, left double-J stent extraction, left ureteroscopy with holmium laser and extraction of left ureteral stone, double-J stent placement (24 cm x 6-French with string).  SURGEON:  Bertram Millard. Desi Carby, M.D.  ANESTHESIA:  General with LMA.  COMPLICATIONS:  None.  SPECIMENS:  None.  BRIEF HISTORY:  55 year old male who I attempted stone extraction 2 to 3 weeks ago on an urgent basis due to pain.  Unfortunately, I was unable to access the left ureter, and the stent was placed.  The patient presents at this time for repeat procedure, stent removal, ureteroscopy and holmium laser and extraction of stone.  Risks and complications have been discussed with this patient who understands these and desires to proceed.  DESCRIPTION OF PROCEDURE:  The patient was identified in the holding area, the surgical site was marked properly, he received preoperative IV antibiotics and was taken to the operating room where general anesthesia was administered.  He was placed in the dorsal lithotomy position. Genitalia and perineum were prepped and draped.  Time-out was then performed.  Procedure then commenced.  22-French panendoscope was advanced directly through his urethra, and the left ureteral stent was identified and removed.  I then placed a guidewire up through the ureter with a good curl seen in the renal pelvis fluoroscopically.  With the help of a navigating guidewire, I was able to place 6 short  ureteroscope, rigid in nature, up to the stone.  The stone was identified, and backstop was placed proximal to the stone to prevent migration of the renal pelvis. At this point, I used a 365 micron laser fiber to fragment the stone and multiple fragments which were then removed with the basket and placed in the bladder.  No further stones were seen.  I then placed, using a cystoscope, a 24 cm x 6-French contour catheter.  String was left on the end.  Good curls were seen proximally and distally following removal of the guidewire.  I left the stones in the bladder for the patient to void these at a later time.  The patient tolerated the procedure well.  He was awakened and taken to PACU in stable condition.  I will leave him in the hospital overnight due to the lack of transportation.  He will be discharged in the morning on 5 days of Bactrim DS 1 p.o. b.i.d. as well as Percocet.  He will be followed up in my office.  I left instructions for the patient to remove his stent by pulling on the string in approximately 3 days.Bertram Millard. Toshiko Kemler, M.D.     SMD/MEDQ  D:  09/16/2010  T:  09/16/2010  Job:  [id number]  Electronically Signed by Marcine Matar M.D. on 10/05/2010 12:50:15 PM

## 2011-01-30 LAB — URINALYSIS, ROUTINE W REFLEX MICROSCOPIC
Glucose, UA: NEGATIVE
Ketones, ur: NEGATIVE
Protein, ur: NEGATIVE

## 2011-01-30 LAB — DIFFERENTIAL
Lymphocytes Relative: 38
Lymphs Abs: 3.6
Monocytes Absolute: 1.2 — ABNORMAL HIGH
Monocytes Relative: 12
Neutro Abs: 4.3

## 2011-01-30 LAB — CBC
HCT: 44.6
MCV: 87
Platelets: 369
RDW: 13.8

## 2011-01-30 LAB — COMPREHENSIVE METABOLIC PANEL
Albumin: 4
BUN: 10
Calcium: 9.6
Creatinine, Ser: 0.9
Potassium: 4
Total Protein: 7.7

## 2011-01-30 LAB — OCCULT BLOOD X 1 CARD TO LAB, STOOL: Fecal Occult Bld: NEGATIVE

## 2011-02-02 LAB — CBC
HCT: 42.4
Hemoglobin: 14.5
MCHC: 34.2
RDW: 13.5

## 2011-02-02 LAB — RAPID URINE DRUG SCREEN, HOSP PERFORMED
Amphetamines: NOT DETECTED
Barbiturates: NOT DETECTED
Benzodiazepines: NOT DETECTED
Cocaine: NOT DETECTED
Opiates: NOT DETECTED

## 2011-02-02 LAB — POCT I-STAT, CHEM 8
BUN: 14
Calcium, Ion: 1.18
Chloride: 105
Potassium: 4.4

## 2011-02-02 LAB — CK TOTAL AND CKMB (NOT AT ARMC)
CK, MB: 1.1
Relative Index: INVALID

## 2011-02-02 LAB — DIFFERENTIAL
Basophils Absolute: 0
Basophils Relative: 0
Eosinophils Relative: 5
Lymphocytes Relative: 37
Monocytes Absolute: 1.3 — ABNORMAL HIGH
Monocytes Relative: 14 — ABNORMAL HIGH

## 2011-02-02 LAB — LIPID PANEL
Cholesterol: 174
HDL: 24 — ABNORMAL LOW
LDL Cholesterol: 86
Triglycerides: 322 — ABNORMAL HIGH

## 2011-02-02 LAB — CARDIAC PANEL(CRET KIN+CKTOT+MB+TROPI)
CK, MB: 1
CK, MB: 1.2
CK, MB: 1.2
Total CK: 57
Troponin I: 0.02
Troponin I: 0.03

## 2011-02-02 LAB — TROPONIN I: Troponin I: 0.02

## 2011-02-02 LAB — POCT CARDIAC MARKERS: Troponin i, poc: <0.05

## 2011-06-05 ENCOUNTER — Emergency Department (HOSPITAL_COMMUNITY)
Admission: EM | Admit: 2011-06-05 | Discharge: 2011-06-05 | Disposition: A | Discharge status: Home / Self Care | Payer: Self-pay | Attending: Emergency Medicine | Admitting: Emergency Medicine

## 2011-06-05 ENCOUNTER — Encounter (HOSPITAL_COMMUNITY): Payer: Self-pay | Admitting: *Deleted

## 2011-06-05 DIAGNOSIS — L02419 Cutaneous abscess of limb, unspecified: Secondary | ICD-10-CM

## 2011-06-05 DIAGNOSIS — IMO0002 Reserved for concepts with insufficient information to code with codable children: Secondary | ICD-10-CM | POA: Insufficient documentation

## 2011-06-05 DIAGNOSIS — F17200 Nicotine dependence, unspecified, uncomplicated: Secondary | ICD-10-CM | POA: Insufficient documentation

## 2011-06-05 MED ORDER — HYDROCODONE-ACETAMINOPHEN 5-325 MG PO TABS: 2.0000 | ORAL_TABLET | ORAL | Status: AC | PRN

## 2011-06-05 MED ORDER — LIDOCAINE-EPINEPHRINE 2 %-1:100000 IJ SOLN
20.0000 mL | Freq: Once | INTRAMUSCULAR | Status: AC
  Administered 2011-06-05: 20 mL via INTRADERMAL

## 2011-06-05 NOTE — ED Provider Notes (Signed)
Medical screening examination/treatment/procedure(s) were performed by non-physician practitioner and as supervising physician I was immediately available for consultation/collaboration.  Doug Sou, MD 06/05/11 1406

## 2011-06-05 NOTE — ED Notes (Signed)
Pt states he started to have an knot under his right arm.pt states it is pain to extend his arm. Knot noted to be redden but not draining

## 2011-06-05 NOTE — ED Provider Notes (Signed)
History     CSN: [id number]  Arrival date & time 06/05/11  1025   First MD Initiated Contact with Patient 06/05/11 1049      No chief complaint on file.   (Consider location/radiation/quality/duration/timing/severity/associated sxs/prior treatment) HPI   56 year old gentleman presented to the ED with chief complaints of abscess. Patient states he noticed a knot to his right axillary region. The knot has increased in size the past week. Area is red and tender to the touch. He denies any recent trauma, insect bite, or similar knot elsewhere. He denies fever, neck pain, chest pain, shortness of breath, arm pain, numbness or weakness. Patient does recall having a similar abscess 10 years ago that requires I&D. Past Medical History  Diagnosis Date  . Asthma     History reviewed. No pertinent past surgical history.  No family history on file.  History  Substance Use Topics  . Smoking status: Current Everyday Smoker  . Smokeless tobacco: Not on file  . Alcohol Use: No      Review of Systems  All other systems reviewed and are negative.    Allergies  Penicillins  Home Medications  No current outpatient prescriptions on file.  BP 124/92  Pulse 99  Temp(Src) 98.3 F (36.8 C) (Oral)  Resp 20  Ht 5\' 9"  (1.753 m)  Wt 252 lb (114.306 kg)  BMI 37.21 kg/m2  SpO2 96%  Physical Exam  Nursing note and vitals reviewed. Constitutional:       Awake, alert, nontoxic appearance  HENT:  Head: Atraumatic.  Eyes: Right eye exhibits no discharge. Left eye exhibits no discharge.  Neck: Neck supple.  Pulmonary/Chest: Effort normal. He exhibits no tenderness.  Abdominal: There is no tenderness. There is no rebound.  Musculoskeletal: He exhibits no tenderness.       Baseline ROM, no obvious new focal weakness  Neurological:       Mental status and motor strength appears baseline for patient and situation  Skin: No rash noted.     Psychiatric: He has a normal mood and affect.      ED Course  Procedures (including critical care time)  Labs Reviewed - No data to display No results found.   No diagnosis found.  INCISION AND DRAINAGE Performed by: Fayrene Helper Consent: Verbal consent obtained. Risks and benefits: risks, benefits and alternatives were discussed Type: abscess  Body area: R axillary region  Anesthesia: local infiltration  Local anesthetic: lidocaine 2% w epinephrine  Anesthetic total: 5 ml  Complexity: complex Blunt dissection to break up loculations  Drainage: purulent  Drainage amount: large  Packing material: 1/4 in iodoform gauze  Patient tolerance: Patient tolerated the procedure well with no immediate complications.    MDM  Abscess with successful I&D.  Smoking cessation discussed.  Care instruction given.  No need for abx at this time as cellulitis was not impressive.  Pt voice understanding.          Fayrene Helper, PA-C 06/05/11 1123

## 2011-11-05 IMAGING — CT CT ABD-PELV W/O CM
1 series · 16 of 32 positions shown, 20 images · non-contrast
Comparison: 08/30/2010

CLINICAL DATA: Left flank pain with history of kidney stones.

CT ABDOMEN AND PELVIS WITHOUT CONTRAST
TECHNIQUE: Multidetector CT imaging of the abdomen and pelvis was
performed following the standard protocol without intravenous
contrast.

[Series 4: lung windows · axial · 0.85mm/px · z∈[+773,+1193]mm · 16 of 94 slices shown, 20 images]
[im 7/94  soft-tissue]
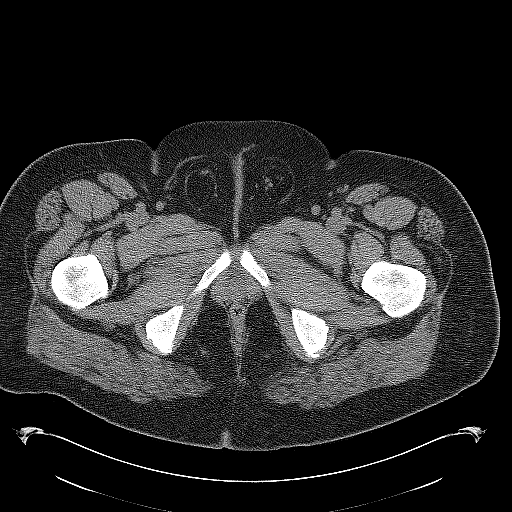
[im 7/94  bone]
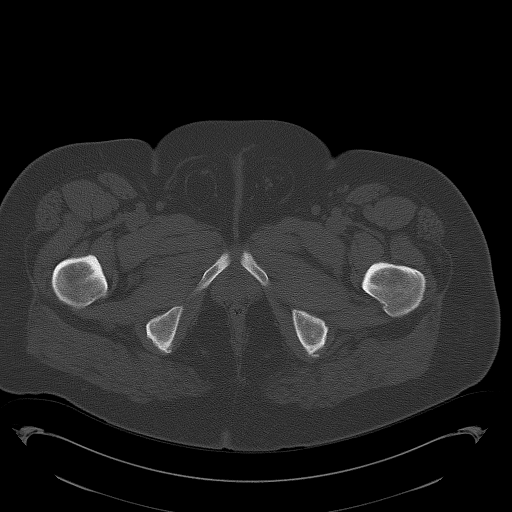
[im 13/94  soft-tissue]
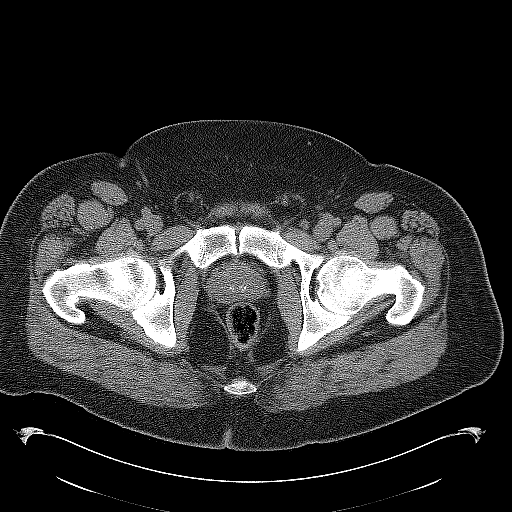
[im 19/94  soft-tissue]
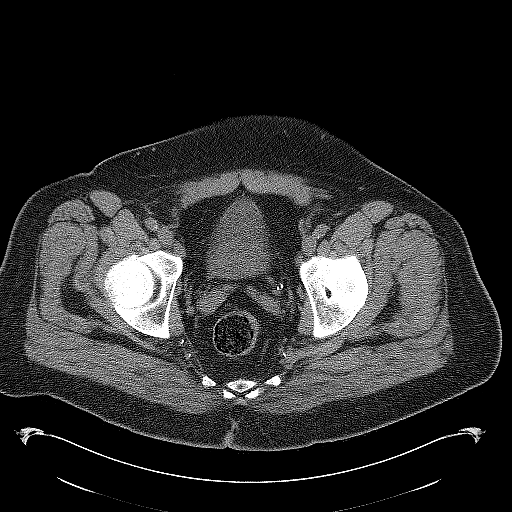
[im 25/94  soft-tissue]
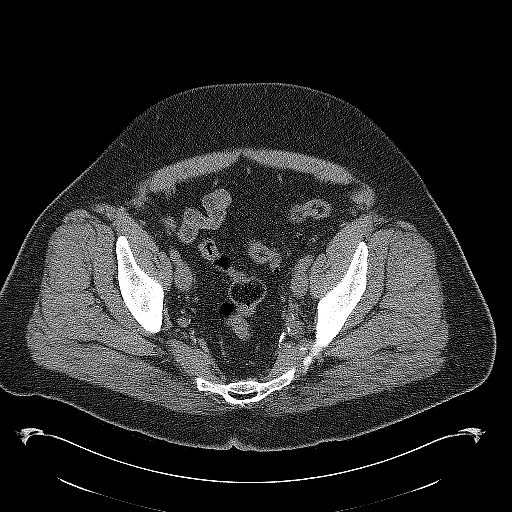
[im 31/94  soft-tissue]
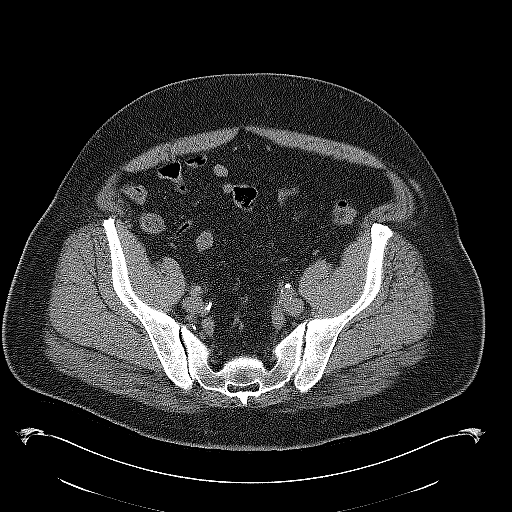
[im 37/94  soft-tissue]
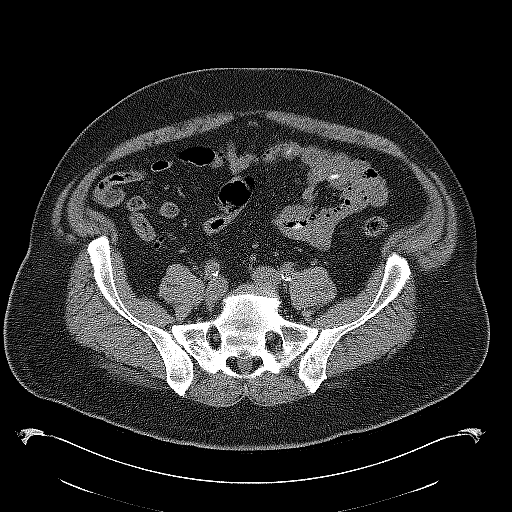
[im 43/94  soft-tissue]
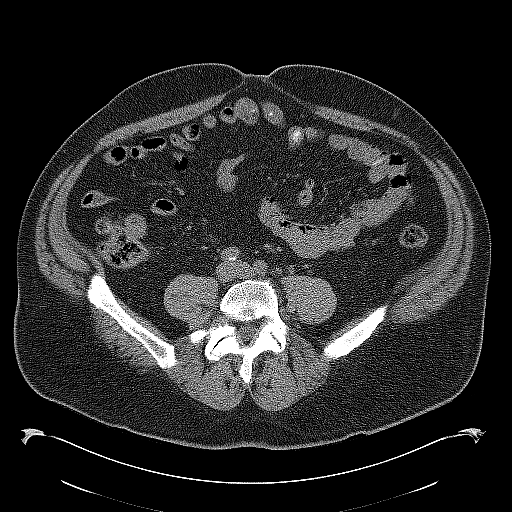
[im 52/94  soft-tissue]
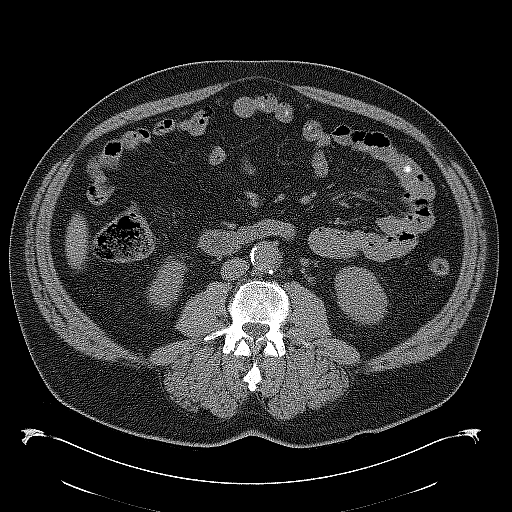
[im 58/94  soft-tissue]
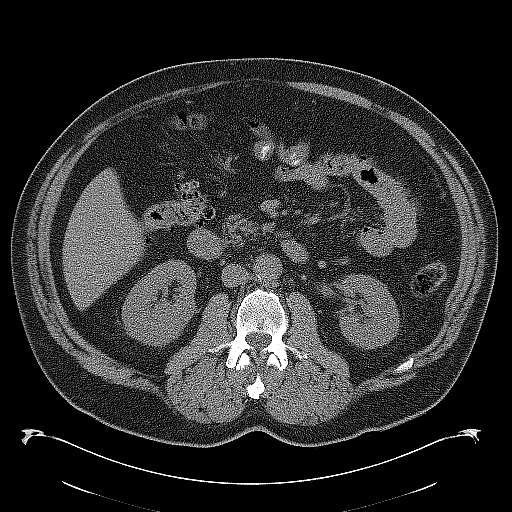
[im 58/94  bone]
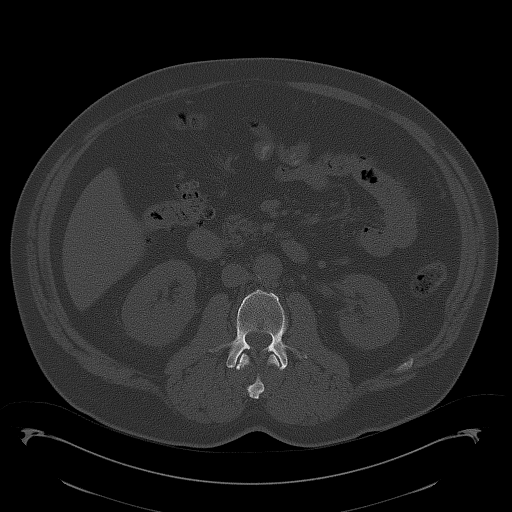
[im 64/94  soft-tissue]
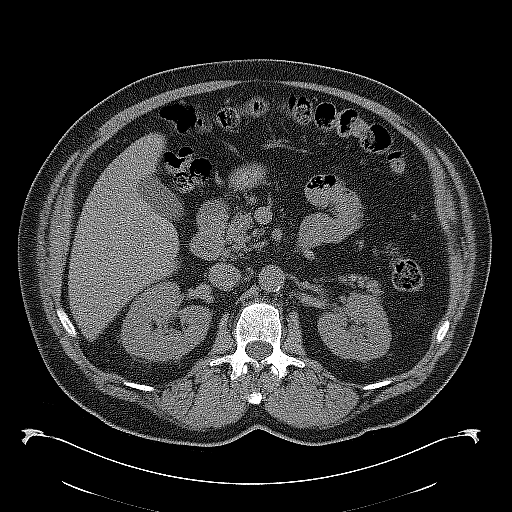
[im 70/94  soft-tissue]
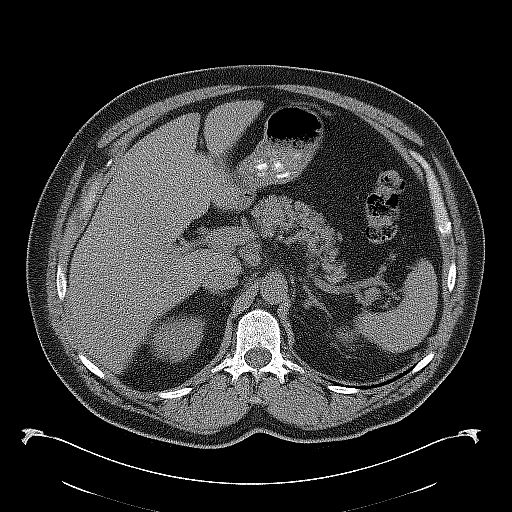
[im 76/94  soft-tissue]
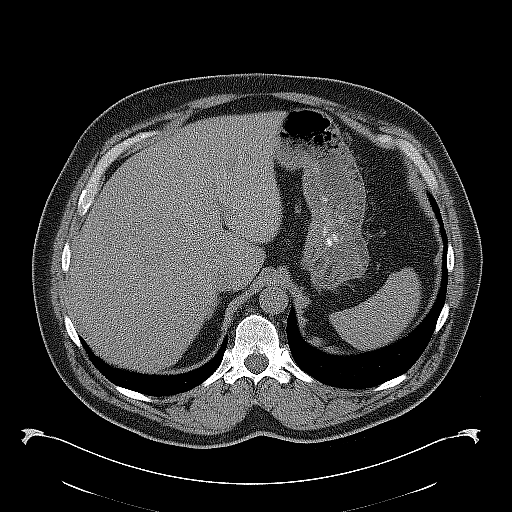
[im 82/94  soft-tissue]
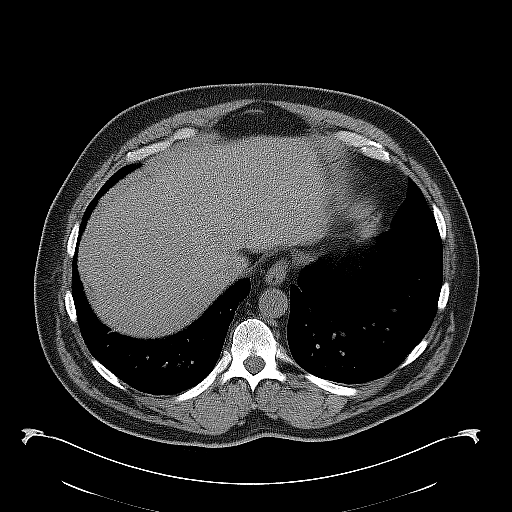
[im 82/94  lung]
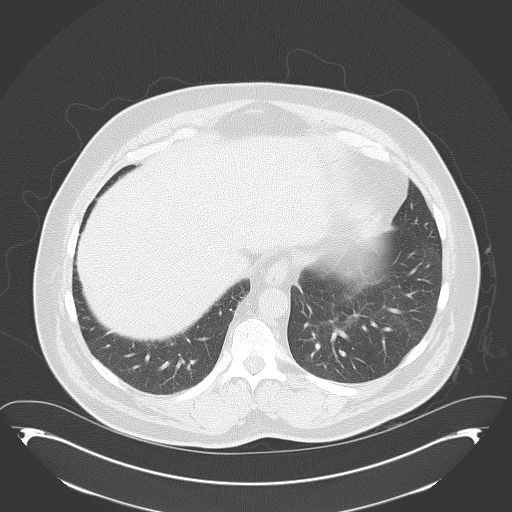
[im 85/94  lung]
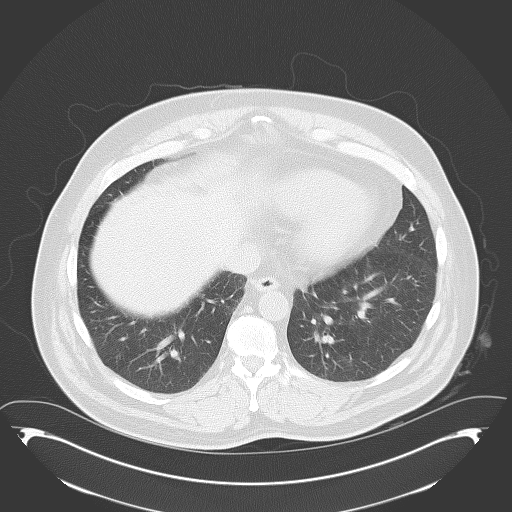
[im 88/94  soft-tissue]
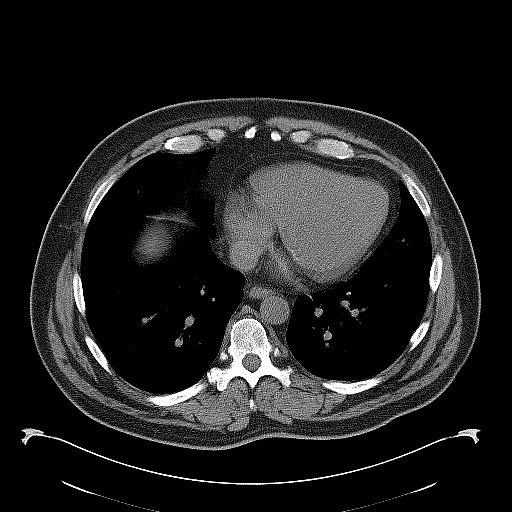
[im 88/94  lung]
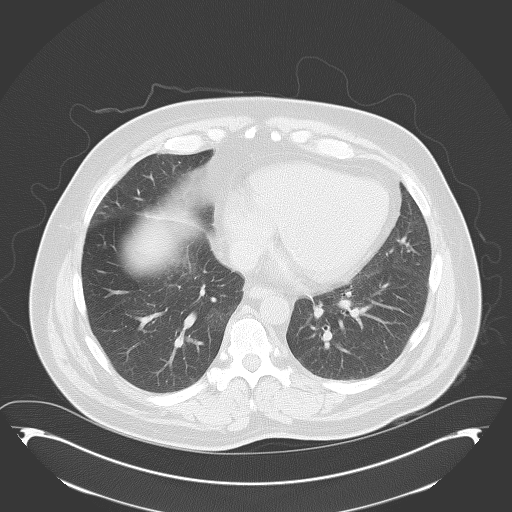
[im 91/94  lung]
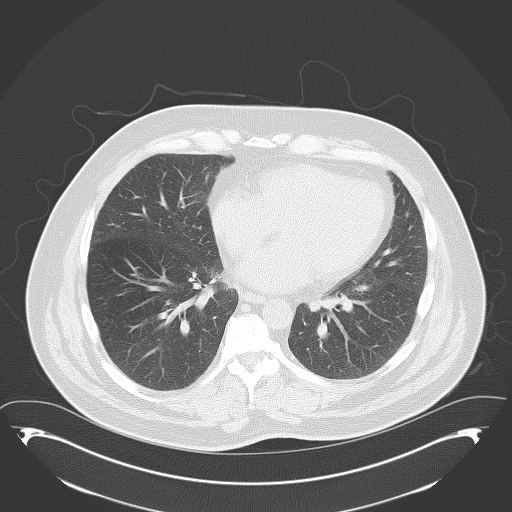

[16 of 32 positions shown; findings below may reference images not displayed]

FINDINGS: Lung bases show scattered small pulmonary nodules which
are unchanged from 07/10/2007, and are therefore considered benign.
Heart size normal.  No pericardial or pleural effusion.

Liver, gallbladder and adrenal glands are unremarkable.  Stones are
seen in both kidneys.  Ureters are decompressed bilaterally.  There
may be very mild left periureteric stranding.  Spleen, pancreas,
stomach, bowel and bladder are unremarkable.  No pathologically
enlarged lymph nodes.  Atherosclerotic calcification of the
arterial vasculature.  Retroaortic left renal vein.  No worrisome
lytic or sclerotic lesions.
IMPRESSION: Bilateral nephrolithiasis.  Ureters are decompressed.  Mild left
periureteric stranding can be seen with recent stone passage.

## 2012-04-29 ENCOUNTER — Emergency Department (HOSPITAL_COMMUNITY)
Admission: EM | Admit: 2012-04-29 | Discharge: 2012-04-29 | Disposition: A | Discharge status: Home / Self Care | Payer: Self-pay | Attending: Emergency Medicine | Admitting: Emergency Medicine

## 2012-04-29 ENCOUNTER — Encounter (HOSPITAL_COMMUNITY): Payer: Self-pay | Admitting: *Deleted

## 2012-04-29 ENCOUNTER — Emergency Department (HOSPITAL_COMMUNITY): Payer: Self-pay

## 2012-04-29 DIAGNOSIS — M719 Bursopathy, unspecified: Secondary | ICD-10-CM | POA: Insufficient documentation

## 2012-04-29 DIAGNOSIS — M259 Joint disorder, unspecified: Secondary | ICD-10-CM | POA: Insufficient documentation

## 2012-04-29 DIAGNOSIS — I10 Essential (primary) hypertension: Secondary | ICD-10-CM | POA: Insufficient documentation

## 2012-04-29 DIAGNOSIS — E7800 Pure hypercholesterolemia, unspecified: Secondary | ICD-10-CM | POA: Insufficient documentation

## 2012-04-29 DIAGNOSIS — G8929 Other chronic pain: Secondary | ICD-10-CM | POA: Insufficient documentation

## 2012-04-29 DIAGNOSIS — M67919 Unspecified disorder of synovium and tendon, unspecified shoulder: Secondary | ICD-10-CM | POA: Insufficient documentation

## 2012-04-29 DIAGNOSIS — Z79899 Other long term (current) drug therapy: Secondary | ICD-10-CM | POA: Insufficient documentation

## 2012-04-29 DIAGNOSIS — J45909 Unspecified asthma, uncomplicated: Secondary | ICD-10-CM | POA: Insufficient documentation

## 2012-04-29 DIAGNOSIS — F17200 Nicotine dependence, unspecified, uncomplicated: Secondary | ICD-10-CM | POA: Insufficient documentation

## 2012-04-29 HISTORY — DX: Pure hypercholesterolemia, unspecified: E78.00

## 2012-04-29 HISTORY — DX: Essential (primary) hypertension: I10

## 2012-04-29 MED ORDER — OXYCODONE-ACETAMINOPHEN 5-325 MG PO TABS
1.0000 | ORAL_TABLET | Freq: Once | ORAL | Status: AC
  Administered 2012-04-29: 1 via ORAL
  Filled 2012-04-29: qty 1

## 2012-04-29 MED ORDER — IBUPROFEN 800 MG PO TABS
800.0000 mg | ORAL_TABLET | Freq: Once | ORAL | Status: DC
  Filled 2012-04-29: qty 1

## 2012-04-29 MED ORDER — OXYCODONE-ACETAMINOPHEN 5-325 MG PO TABS: 2.0000 | ORAL_TABLET | ORAL | Status: DC | PRN

## 2012-04-29 MED ORDER — MELOXICAM 15 MG PO TABS: 15.0000 mg | ORAL_TABLET | Freq: Every day | ORAL | Status: DC

## 2012-04-29 NOTE — ED Provider Notes (Signed)
History     CSN: [id number]  Arrival date & time 04/29/12  1046   First MD Initiated Contact with Patient 04/29/12 1224      Chief Complaint  Patient presents with  . Shoulder Pain  . Arm Pain    (Consider location/radiation/quality/duration/timing/severity/associated sxs/prior treatment) Patient is a 56 y.o. male presenting with shoulder pain. The history is provided by the patient. No language interpreter was used.  Shoulder Pain This is a chronic problem. The current episode started 1 to 4 weeks ago. The problem occurs daily. The problem has been gradually worsening. Associated symptoms include arthralgias. Pertinent negatives include no fever, joint swelling, nausea, neck pain, vomiting or weakness. The symptoms are aggravated by bending. Treatments tried: goodys and aleve. The treatment provided moderate relief.  56 yo male c/ochronic L shoulder pain x 2 weeks worsening x 2 days.  Painful with passive ROM.  Able to touch R shoulder.  Unable to put L hand behind his back.  Pain is 8/10 presently.  No swelling, erythema or weakness to shoulder or extremity.    Past Medical History  Diagnosis Date  . Asthma   . High cholesterol   . Hypertension     History reviewed. No pertinent past surgical history.  History reviewed. No pertinent family history.  History  Substance Use Topics  . Smoking status: Current Every Day Smoker    Types: Cigarettes  . Smokeless tobacco: Not on file  . Alcohol Use: No      Review of Systems  Constitutional: Negative.  Negative for fever.  HENT: Negative.  Negative for neck pain.   Eyes: Negative.   Respiratory: Negative.   Cardiovascular: Negative.   Gastrointestinal: Negative.  Negative for nausea and vomiting.  Musculoskeletal: Positive for arthralgias. Negative for joint swelling.       L shoulder pain  Neurological: Negative.  Negative for weakness.  Psychiatric/Behavioral: Negative.   All other systems reviewed and are  negative.    Allergies  Penicillins  Home Medications   Current Outpatient Rx  Name  Route  Sig  Dispense  Refill  . ALBUTEROL SULFATE HFA 108 (90 BASE) MCG/ACT IN AERS   Inhalation   Inhale 2 puffs into the lungs every 6 (six) hours as needed. For shortness of breath and wheezing         . OMEGA-3 FATTY ACIDS 1000 MG PO CAPS   Oral   Take 1 g by mouth every morning.         Marland Kitchen LISINOPRIL 20 MG PO TABS   Oral   Take 20 mg by mouth every morning.         Marland Kitchen RANITIDINE HCL 75 MG PO TABS   Oral   Take 150 mg by mouth 2 (two) times daily as needed. For indigestion         . SIMVASTATIN 80 MG PO TABS   Oral   Take 80 mg by mouth at bedtime.           BP 131/88  Pulse 86  Temp 98.2 F (36.8 C) (Oral)  Resp 20  SpO2 98%  Physical Exam  Nursing note and vitals reviewed. Constitutional: He is oriented to person, place, and time. He appears well-developed and well-nourished.  HENT:  Head: Normocephalic.  Eyes: Conjunctivae normal and EOM are normal. Pupils are equal, round, and reactive to light.  Neck: Normal range of motion. Neck supple.  Cardiovascular: Normal rate.   Pulmonary/Chest: Effort normal.  Abdominal: Soft.  Musculoskeletal: Normal range of motion. He exhibits tenderness. He exhibits no edema.       L shoulder tenderness to the bursa.  Limited ROM due to pain.  No swelling or erythema. Skin cool to touch.  + CMS below the L shoulder.   Neurological: He is alert and oriented to person, place, and time. No cranial nerve deficit.  Skin: Skin is warm and dry.  Psychiatric: He has a normal mood and affect.    ED Course  Procedures (including critical care time)  Labs Reviewed - No data to display No results found.   No diagnosis found.    MDM  Chronic L shoulder pain with degenerative changes on x-ray to the cervical spine facet joints and AC joint.  Follow up with orthopedics.  rx for Mobic or ibuprofen for pain/ice.  Patient did not want a  sling.  A few percocet for pain.  Patient is ready for discharge.         Remi Haggard, NP 04/30/12 [phone]

## 2012-04-29 NOTE — ED Notes (Signed)
Pt c/o left shoulder and arm pain x's 2 weeks. Also reports some chest discomfort. Reports sharp shooting pain radiating to left chest with movement of arm.

## 2012-04-30 NOTE — ED Provider Notes (Signed)
Medical screening examination/treatment/procedure(s) were performed by non-physician practitioner and as supervising physician I was immediately available for consultation/collaboration.   Benny Lennert, MD 04/30/12 1414

## 2012-07-03 ENCOUNTER — Encounter (HOSPITAL_COMMUNITY): Payer: Self-pay | Admitting: Emergency Medicine

## 2012-07-03 ENCOUNTER — Emergency Department (HOSPITAL_COMMUNITY): Payer: Non-veteran care

## 2012-07-03 ENCOUNTER — Emergency Department (HOSPITAL_COMMUNITY)
Admission: EM | Admit: 2012-07-03 | Discharge: 2012-07-03 | Disposition: A | Discharge status: Home / Self Care | Payer: Non-veteran care | Attending: Emergency Medicine | Admitting: Emergency Medicine

## 2012-07-03 ENCOUNTER — Other Ambulatory Visit (HOSPITAL_COMMUNITY): Payer: Self-pay

## 2012-07-03 DIAGNOSIS — F17200 Nicotine dependence, unspecified, uncomplicated: Secondary | ICD-10-CM | POA: Insufficient documentation

## 2012-07-03 DIAGNOSIS — K5792 Diverticulitis of intestine, part unspecified, without perforation or abscess without bleeding: Secondary | ICD-10-CM

## 2012-07-03 DIAGNOSIS — K5732 Diverticulitis of large intestine without perforation or abscess without bleeding: Secondary | ICD-10-CM | POA: Insufficient documentation

## 2012-07-03 DIAGNOSIS — J449 Chronic obstructive pulmonary disease, unspecified: Secondary | ICD-10-CM | POA: Insufficient documentation

## 2012-07-03 DIAGNOSIS — E785 Hyperlipidemia, unspecified: Secondary | ICD-10-CM | POA: Insufficient documentation

## 2012-07-03 DIAGNOSIS — I10 Essential (primary) hypertension: Secondary | ICD-10-CM | POA: Insufficient documentation

## 2012-07-03 DIAGNOSIS — Z79899 Other long term (current) drug therapy: Secondary | ICD-10-CM | POA: Insufficient documentation

## 2012-07-03 DIAGNOSIS — J4489 Other specified chronic obstructive pulmonary disease: Secondary | ICD-10-CM | POA: Insufficient documentation

## 2012-07-03 HISTORY — DX: Chronic obstructive pulmonary disease, unspecified: J44.9

## 2012-07-03 LAB — URINALYSIS, ROUTINE W REFLEX MICROSCOPIC
Glucose, UA: NEGATIVE mg/dL
Leukocytes, UA: NEGATIVE
Nitrite: NEGATIVE
Specific Gravity, Urine: 1.02 (ref 1.005–1.030)
pH: 5.5 (ref 5.0–8.0)

## 2012-07-03 LAB — CBC WITH DIFFERENTIAL/PLATELET
Basophils Absolute: 0 10*3/uL (ref 0.0–0.1)
Eosinophils Absolute: 0.4 10*3/uL (ref 0.0–0.7)
Lymphs Abs: 4.3 10*3/uL — ABNORMAL HIGH (ref 0.7–4.0)
MCHC: 28.2 g/dL — ABNORMAL LOW (ref 30.0–36.0)
MCV: 110.2 fL — ABNORMAL HIGH (ref 78.0–100.0)
Monocytes Relative: 11 % (ref 3–12)
Platelets: 386 10*3/uL (ref 150–400)
RDW: 13.5 % (ref 11.5–15.5)
WBC: 19.4 10*3/uL — ABNORMAL HIGH (ref 4.0–10.5)

## 2012-07-03 LAB — COMPREHENSIVE METABOLIC PANEL
AST: 20 U/L (ref 0–37)
Albumin: 4.1 g/dL (ref 3.5–5.2)
BUN: 13 mg/dL (ref 6–23)
Calcium: 10.8 mg/dL — ABNORMAL HIGH (ref 8.4–10.5)
Chloride: 99 mEq/L (ref 96–112)
Creatinine, Ser: 0.86 mg/dL (ref 0.50–1.35)
Total Bilirubin: 0.5 mg/dL (ref 0.3–1.2)
Total Protein: 8.5 g/dL — ABNORMAL HIGH (ref 6.0–8.3)

## 2012-07-03 LAB — LIPASE, BLOOD: Lipase: 79 U/L — ABNORMAL HIGH (ref 11–59)

## 2012-07-03 MED ORDER — TRAMADOL HCL 50 MG PO TABS: 50.0000 mg | ORAL_TABLET | Freq: Four times a day (QID) | ORAL | Status: DC | PRN

## 2012-07-03 MED ORDER — PROMETHAZINE HCL 25 MG PO TABS: 25.0000 mg | ORAL_TABLET | Freq: Four times a day (QID) | ORAL | Status: DC | PRN

## 2012-07-03 MED ORDER — ONDANSETRON HCL 4 MG/2ML IJ SOLN
4.0000 mg | Freq: Once | INTRAMUSCULAR | Status: AC
  Administered 2012-07-03: 4 mg via INTRAVENOUS
  Filled 2012-07-03: qty 2

## 2012-07-03 MED ORDER — METRONIDAZOLE 500 MG PO TABS: ORAL_TABLET | ORAL | Status: DC

## 2012-07-03 MED ORDER — METRONIDAZOLE IN NACL 5-0.79 MG/ML-% IV SOLN
500.0000 mg | Freq: Once | INTRAVENOUS | Status: AC
  Administered 2012-07-03: 500 mg via INTRAVENOUS
  Filled 2012-07-03: qty 100

## 2012-07-03 MED ORDER — HYDROMORPHONE HCL PF 1 MG/ML IJ SOLN
0.5000 mg | Freq: Once | INTRAMUSCULAR | Status: AC
  Administered 2012-07-03: 0.5 mg via INTRAVENOUS
  Filled 2012-07-03: qty 1

## 2012-07-03 MED ORDER — HYDROMORPHONE HCL PF 1 MG/ML IJ SOLN
1.0000 mg | Freq: Once | INTRAMUSCULAR | Status: AC
  Administered 2012-07-03: 1 mg via INTRAVENOUS
  Filled 2012-07-03: qty 1

## 2012-07-03 MED ORDER — CIPROFLOXACIN HCL 500 MG PO TABS: 500.0000 mg | ORAL_TABLET | Freq: Two times a day (BID) | ORAL | Status: DC

## 2012-07-03 MED ORDER — SODIUM CHLORIDE 0.9 % IV BOLUS (SEPSIS)
500.0000 mL | Freq: Once | INTRAVENOUS | Status: AC
  Administered 2012-07-03: 500 mL via INTRAVENOUS

## 2012-07-03 MED ORDER — IOHEXOL 300 MG/ML  SOLN
100.0000 mL | Freq: Once | INTRAMUSCULAR | Status: AC | PRN
  Administered 2012-07-03: 100 mL via INTRAVENOUS

## 2012-07-03 MED ORDER — CIPROFLOXACIN IN D5W 400 MG/200ML IV SOLN
400.0000 mg | Freq: Once | INTRAVENOUS | Status: AC
  Administered 2012-07-03: 400 mg via INTRAVENOUS
  Filled 2012-07-03: qty 200

## 2012-07-03 MED ORDER — IOHEXOL 300 MG/ML  SOLN
50.0000 mL | Freq: Once | INTRAMUSCULAR | Status: AC | PRN
  Administered 2012-07-03: 50 mL via ORAL

## 2012-07-03 NOTE — ED Provider Notes (Signed)
History     CSN: [id number]  Arrival date & time 07/03/12  4098   First MD Initiated Contact with Patient 07/03/12 [phone]      Chief Complaint  Patient presents with  . Abdominal Pain    (Consider location/radiation/quality/duration/timing/severity/associated sxs/prior treatment) Patient is a 57 y.o. male presenting with abdominal pain. The history is provided by the patient (the pt complains of right side abd pain). No language interpreter was used.  Abdominal Pain Pain location:  RLQ Pain quality: aching and dull   Pain radiates to:  Does not radiate Pain severity:  Moderate Onset quality:  Gradual Timing:  Constant Associated symptoms: no chest pain, no cough, no diarrhea, no fatigue and no hematuria     Past Medical History  Diagnosis Date  . Asthma   . High cholesterol   . Hypertension   . COPD (chronic obstructive pulmonary disease)     History reviewed. No pertinent past surgical history.  History reviewed. No pertinent family history.  History  Substance Use Topics  . Smoking status: Current Every Day Smoker    Types: Cigarettes  . Smokeless tobacco: Not on file  . Alcohol Use: No      Review of Systems  Constitutional: Negative for fatigue.  HENT: Negative for congestion, sinus pressure and ear discharge.   Eyes: Negative for discharge.  Respiratory: Negative for cough.   Cardiovascular: Negative for chest pain.  Gastrointestinal: Positive for abdominal pain. Negative for diarrhea.  Genitourinary: Negative for frequency and hematuria.  Musculoskeletal: Negative for back pain.  Skin: Negative for rash.  Neurological: Negative for seizures and headaches.  Psychiatric/Behavioral: Negative for hallucinations.    Allergies  Penicillins  Home Medications   Current Outpatient Rx  Name  Route  Sig  Dispense  Refill  . albuterol (PROVENTIL HFA;VENTOLIN HFA) 108 (90 BASE) MCG/ACT inhaler   Inhalation   Inhale 2 puffs into the lungs every 6 (six)  hours as needed. For shortness of breath and wheezing         . fish oil-omega-3 fatty acids 1000 MG capsule   Oral   Take 1 g by mouth every morning.         Marland Kitchen HYDROcodone-acetaminophen (NORCO/VICODIN) 5-325 MG per tablet   Oral   Take 1 tablet by mouth every 6 (six) hours as needed for pain.         Marland Kitchen lisinopril (PRINIVIL,ZESTRIL) 20 MG tablet   Oral   Take 10-20 mg by mouth every morning.          Marland Kitchen LORazepam (ATIVAN) 1 MG tablet   Oral   Take 1 mg by mouth every 8 (eight) hours as needed for anxiety.         . ranitidine (ZANTAC) 75 MG tablet   Oral   Take 150 mg by mouth 2 (two) times daily as needed. For indigestion         . simvastatin (ZOCOR) 80 MG tablet   Oral   Take 80 mg by mouth at bedtime.         . ciprofloxacin (CIPRO) 500 MG tablet   Oral   Take 1 tablet (500 mg total) by mouth 2 (two) times daily.   20 tablet   0   . metroNIDAZOLE (FLAGYL) 500 MG tablet      Take one 4 times a day   40 tablet   0   . promethazine (PHENERGAN) 25 MG tablet   Oral   Take 1 tablet (25  mg total) by mouth every 6 (six) hours as needed for nausea.   15 tablet   0   . traMADol (ULTRAM) 50 MG tablet   Oral   Take 1 tablet (50 mg total) by mouth every 6 (six) hours as needed for pain.   30 tablet   0     BP 142/91  Pulse 108  Temp(Src) 97.7 F (36.5 C) (Oral)  Resp 16  SpO2 97%  Physical Exam  Constitutional: He is oriented to person, place, and time. He appears well-developed.  HENT:  Head: Normocephalic and atraumatic.  Eyes: Conjunctivae and EOM are normal. No scleral icterus.  Neck: Neck supple. No thyromegaly present.  Cardiovascular: Normal rate and regular rhythm.  Exam reveals no gallop and no friction rub.   No murmur heard. Pulmonary/Chest: No stridor. He has no wheezes. He has no rales. He exhibits no tenderness.  Abdominal: He exhibits no distension. There is tenderness. There is no rebound.  Tender rlq  Musculoskeletal: Normal  range of motion. He exhibits no edema.  Lymphadenopathy:    He has no cervical adenopathy.  Neurological: He is oriented to person, place, and time. Coordination normal.  Skin: No rash noted. No erythema.  Psychiatric: He has a normal mood and affect. His behavior is normal.    ED Course  Procedures (including critical care time)  Labs Reviewed  CBC WITH DIFFERENTIAL - Abnormal; Notable for the following:    WBC 19.4 (*)    HCT 57.5 (*)    MCV 110.2 (*)    MCHC 28.2 (*)    Neutro Abs 12.6 (*)    Lymphs Abs 4.3 (*)    Monocytes Absolute 2.1 (*)    All other components within normal limits  COMPREHENSIVE METABOLIC PANEL - Abnormal; Notable for the following:    Sodium 133 (*)    Glucose, Bld 122 (*)    Calcium 10.8 (*)    Total Protein 8.5 (*)    All other components within normal limits  LIPASE, BLOOD - Abnormal; Notable for the following:    Lipase 79 (*)    All other components within normal limits  URINALYSIS, ROUTINE W REFLEX MICROSCOPIC   Ct Abdomen Pelvis W Contrast  07/03/2012  *RADIOLOGY REPORT*  Clinical Data: Generalized abdominal pain for 3 days. Constipation.  Right lower quadrant pain.  CT ABDOMEN AND PELVIS WITH CONTRAST  Technique:  Multidetector CT imaging of the abdomen and pelvis was performed following the standard protocol during bolus administration of intravenous contrast.  Contrast: OMNIPAQUE IOHEXOL 300 MG/ML  SOLN  Comparison: 09/28/2010 and previous, as distant as 07/10/2007  Findings: Pulmonary nodules at the right lung base have been stable since 2090 and are therefore benign.  The largest demonstrated is a 6 mm nodule centrally in the right lower lobe on image #1.  No pleural or pericardial fluid.  There is some fatty change of the liver, geographically more pronounced in the right lobe than the left. No focal lesion otherwise.  No calcified gallstones.  The spleen is normal.  The pancreas is normal.  The adrenal glands are normal.  The right kidney  contains a 5 mm stone in the lower pole, and adjacent 3 mm stone in the lower pole, and 3 mm stone in the midportion.  No cyst, mass or hydronephrosis.  The left kidney contains a 2 mm stone in the upper pole and in the midportion.  No focal or acute finding otherwise on this side.  There  is atherosclerosis of the aorta with aneurysmal dilatation of the infrarenal aorta to a maximal diameter of 3.4 cm.  On the previous exam, maximal diameter was 2.8 cm.  No sign of retroperitoneal bleeding.  The IVC is normal.  No retroperitoneal mass or adenopathy.  There is diverticulosis of the colon.  The appendix is normal. There is inflammatory change in the region of the ascending colon. The pattern is most consistent with right colon diverticulitis.  No evidence of abscess.  No definable underlying mass lesion.  No free fluid in the pelvis.  Bladder, prostate gland and seminal vesicles are unremarkable.  There is ordinary degenerative change of the lower lumbar spine.  IMPRESSION: Acute diverticulitis of the right colon.  Pericolic inflammatory change without frank abscess.  Normal appearing appendix.  No positive evidence of mass lesion.  Bilateral renal calculi without evidence of obstruction or passing stone.  Atherosclerosis of the aorta.  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  The.  Fatty change of the liver, angiographically more pronounced in the right lobe.   Original Report Authenticated By: Paulina Fusi, M.D.      1. Diverticulitis       MDM  [facility].  He did not want to be admitted        Benny Lennert, MD 07/03/12 1252

## 2012-07-03 NOTE — ED Notes (Signed)
Patient transported to CT 

## 2012-07-03 NOTE — ED Notes (Signed)
Pt c/o generalized abd pain x 3 days.  States he has been constipated and has been drinking prune juice.  States last normal BM was yesterday morning.  States that the pain has moved to RLQ yesterday evening.  Denies NVD.

## 2013-07-09 ENCOUNTER — Encounter (HOSPITAL_COMMUNITY): Payer: Self-pay | Admitting: Emergency Medicine

## 2013-07-09 ENCOUNTER — Emergency Department (HOSPITAL_COMMUNITY)
Admission: EM | Admit: 2013-07-09 | Discharge: 2013-07-09 | Disposition: A | Discharge status: Home / Self Care | Payer: Non-veteran care | Attending: Emergency Medicine | Admitting: Emergency Medicine

## 2013-07-09 DIAGNOSIS — E7800 Pure hypercholesterolemia, unspecified: Secondary | ICD-10-CM | POA: Insufficient documentation

## 2013-07-09 DIAGNOSIS — I10 Essential (primary) hypertension: Secondary | ICD-10-CM | POA: Insufficient documentation

## 2013-07-09 DIAGNOSIS — Z7982 Long term (current) use of aspirin: Secondary | ICD-10-CM | POA: Insufficient documentation

## 2013-07-09 DIAGNOSIS — F17200 Nicotine dependence, unspecified, uncomplicated: Secondary | ICD-10-CM | POA: Insufficient documentation

## 2013-07-09 DIAGNOSIS — J4489 Other specified chronic obstructive pulmonary disease: Secondary | ICD-10-CM | POA: Insufficient documentation

## 2013-07-09 DIAGNOSIS — Z880 Allergy status to penicillin: Secondary | ICD-10-CM | POA: Insufficient documentation

## 2013-07-09 DIAGNOSIS — Z79899 Other long term (current) drug therapy: Secondary | ICD-10-CM | POA: Insufficient documentation

## 2013-07-09 DIAGNOSIS — J449 Chronic obstructive pulmonary disease, unspecified: Secondary | ICD-10-CM | POA: Insufficient documentation

## 2013-07-09 DIAGNOSIS — L732 Hidradenitis suppurativa: Secondary | ICD-10-CM | POA: Insufficient documentation

## 2013-07-09 DIAGNOSIS — J45909 Unspecified asthma, uncomplicated: Secondary | ICD-10-CM | POA: Insufficient documentation

## 2013-07-09 MED ORDER — HYDROCODONE-ACETAMINOPHEN 5-325 MG PO TABS: 2.0000 | ORAL_TABLET | ORAL | Status: DC | PRN

## 2013-07-09 MED ORDER — SULFAMETHOXAZOLE-TRIMETHOPRIM 800-160 MG PO TABS: 1.0000 | ORAL_TABLET | Freq: Two times a day (BID) | ORAL | Status: AC

## 2013-07-09 MED ORDER — HYDROCODONE-ACETAMINOPHEN 5-325 MG PO TABS
2.0000 | ORAL_TABLET | Freq: Once | ORAL | Status: AC
  Administered 2013-07-09: 2 via ORAL
  Filled 2013-07-09: qty 2

## 2013-07-09 NOTE — ED Notes (Signed)
Tresa EndoKelly, NP at bedside draining abscess.

## 2013-07-09 NOTE — ED Provider Notes (Signed)
CSN: [id number]     Arrival date & time 07/09/13  1058 History   This chart was scribed for Irish EldersKelly Kaniyah Lisby by Ladona Ridgelaylor Day, ED scribe. This patient was seen in room WTR7/WTR7 and the patient's care was started at 1058.  Chief Complaint  Patient presents with  . Abscess   The history is provided by the patient. No language interpreter was used.   HPI Comments: Dennis Passyhomas M Dominguez is a 58 y.o. male who presents to the Emergency Department complaining of a constant, gradually worsened painful/swollen abscess to his right axilla, onset 5 days ago. He denies chills/fever. He has tried taking ibuprofen and aleve for pain w/no relief. He has not tried any warm compress. He denies any recent illnesses.   He is allergic to PCN.    Past Medical History  Diagnosis Date  . Asthma   . High cholesterol   . Hypertension   . COPD (chronic obstructive pulmonary disease)    History reviewed. No pertinent past surgical history. No family history on file. History  Substance Use Topics  . Smoking status: Current Every Day Smoker    Types: Cigarettes  . Smokeless tobacco: Not on file  . Alcohol Use: No    Review of Systems  Constitutional: Negative for fever and chills.  Respiratory: Negative for cough and shortness of breath.   Cardiovascular: Negative for chest pain.  Gastrointestinal: Negative for abdominal pain.  Musculoskeletal: Negative for back pain.  Skin:       Abscess right axilla  All other systems reviewed and are negative.   Allergies  Penicillins  Home Medications   Current Outpatient Rx  Name  Route  Sig  Dispense  Refill  . albuterol (PROVENTIL HFA;VENTOLIN HFA) 108 (90 BASE) MCG/ACT inhaler   Inhalation   Inhale 2 puffs into the lungs every 6 (six) hours as needed. For shortness of breath and wheezing         . aspirin EC 81 MG tablet   Oral   Take 162 mg by mouth daily.         . fish oil-omega-3 fatty acids 1000 MG capsule   Oral   Take 1 g by mouth every  morning.         Marland Kitchen. ibuprofen (ADVIL,MOTRIN) 200 MG tablet   Oral   Take 800 mg by mouth every 6 (six) hours as needed (pain).         Marland Kitchen. lisinopril (PRINIVIL,ZESTRIL) 20 MG tablet   Oral   Take 20 mg by mouth every morning.          . ranitidine (ZANTAC) 150 MG tablet   Oral   Take 150 mg by mouth 2 (two) times daily.         . simvastatin (ZOCOR) 80 MG tablet   Oral   Take 80 mg by mouth at bedtime.         Marland Kitchen. HYDROcodone-acetaminophen (NORCO/VICODIN) 5-325 MG per tablet   Oral   Take 2 tablets by mouth every 4 (four) hours as needed.   10 tablet   0    Triage Vitals: BP 109/68  Pulse 94  Temp(Src) 98.2 F (36.8 C) (Oral)  Resp 16  SpO2 97%  Physical Exam  Nursing note and vitals reviewed. Constitutional: He is oriented to person, place, and time. He appears well-developed and well-nourished. No distress.  HENT:  Head: Normocephalic and atraumatic.  Eyes: Conjunctivae are normal. Right eye exhibits no discharge. Left eye exhibits no discharge.  Neck: Normal range of motion.  Cardiovascular: Normal rate.   Pulmonary/Chest: Effort normal. No respiratory distress.  Musculoskeletal: Normal range of motion. He exhibits no edema.  Neurological: He is alert and oriented to person, place, and time.  Skin: Skin is warm and dry. There is erythema.  3 cm abscess in right axilla. Erythematous and mildly edematous. No red streaking, pain is localized.   Psychiatric: He has a normal mood and affect. Thought content normal.   ED Course  Irrigation and debridement Date/Time: 07/22/2013 12:09 AM Performed by: Irish Elders Authorized by: Irish Elders Consent: Verbal consent obtained. Risks and benefits: risks, benefits and alternatives were discussed Consent given by: patient Patient understanding: patient states understanding of the procedure being performed Patient identity confirmed: verbally with patient and arm band Time out: Immediately prior to procedure a  "time out" was called to verify the correct patient, procedure, equipment, support staff and site/side marked as required. Preparation: Patient was prepped and draped in the usual sterile fashion. Local anesthesia used: yes Anesthesia: local infiltration Local anesthetic: lidocaine 2% without epinephrine Anesthetic total: 4 ml Patient sedated: no Patient tolerance: Patient tolerated the procedure well with no immediate complications. Comments: Moderate amount of drainage.   (including critical care time) DIAGNOSTIC STUDIES: Oxygen Saturation is 97% on room air, normal by my interpretation.    COORDINATION OF CARE: At 62 Discussed treatment plan with patient which includes I&D and pain medicine. Patient agrees.   Labs Review Labs Reviewed - No data to display Imaging Review No results found.   EKG Interpretation None      MDM   Final diagnoses:  Hidradenitis axillaris   I&D of abscess, right axilla. No complications. Discussed plan of care with patient and he agrees. Prescriptions for Bactrim DS and Norco given. Instructions to return for wound check in a couple of days if not doing much better. Return precautions given. Wound care discussed and patient verbalizes understanding.  I personally performed the services described in this documentation, which was scribed in my presence. The recorded information has been reviewed and is accurate.      Irish Elders, NP 07/22/13 0010

## 2013-07-09 NOTE — Discharge Instructions (Signed)
Hidradenitis Suppurativa, Sweat Gland Abscess Hidradenitis suppurativa is a long lasting (chronic), uncommon disease of the sweat glands. With this, boil-like lumps and scarring develop in the groin, some times under the arms (axillae), and under the breasts. It may also uncommonly occur behind the ears, in the crease of the buttocks, and around the genitals.  CAUSES  The cause is from a blocking of the sweat glands. They then become infected. It may cause drainage and odor. It is not contagious. So it cannot be given to someone else. It most often shows up in puberty (about 1410 to 60 years of age). But it may happen much later. It is similar to acne which is a disease of the sweat glands. This condition is slightly more common in African-Americans and women. SYMPTOMS   Hidradenitis usually starts as one or more red, tender, swellings in the groin or under the arms (axilla).  Over a period of hours to days the lesions get larger. They often open to the skin surface, draining clear to yellow-colored fluid.  The infected area heals with scarring. DIAGNOSIS  Your caregiver makes this diagnosis by looking at you. Sometimes cultures (growing germs on plates in the lab) may be taken. This is to see what germ (bacterium) is causing the infection.  TREATMENT   Topical germ killing medicine applied to the skin (antibiotics) are the treatment of choice. Antibiotics taken by mouth (systemic) are sometimes needed when the condition is getting worse or is severe.  Avoid tight-fitting clothing which traps moisture in.  Dirt does not cause hidradenitis and it is not caused by poor hygiene.  Involved areas should be cleaned daily using an antibacterial soap. Some patients find that the liquid form of Lever 2000, applied to the involved areas as a lotion after bathing, can help reduce the odor related to this condition.  Sometimes surgery is needed to drain infected areas or remove scarred tissue. Removal of  large amounts of tissue is used only in severe cases.  Birth control pills may be helpful.  Oral retinoids (vitamin A derivatives) for 6 to 12 months which are effective for acne may also help this condition.  Weight loss will improve but not cure hidradenitis. It is made worse by being overweight. But the condition is not caused by being overweight.  This condition is more common in people who have had acne.  It may become worse under stress. There is no medical cure for hidradenitis. It can be controlled, but not cured. The condition usually continues for years with periods of getting worse and getting better (remission). Document Released: 12/07/2003 Document Revised: 07/17/2011 Document Reviewed: 12/23/2007 [facility] Patient Information 2014 [employer], [place].   Take antibiotic as prescribed Return if redness or additional swelling Return if symptoms worsen Keep wound clean and dry Norco for moderate to severe pain May use warm compresses

## 2013-07-09 NOTE — ED Notes (Signed)
Pt has boil under right arm x 4 days; previous history of same; no drainage

## 2013-07-23 NOTE — ED Provider Notes (Signed)
Medical screening examination/treatment/procedure(s) were performed by non-physician practitioner and as supervising physician I was immediately available for consultation/collaboration.   EKG Interpretation None        David H Yao, MD 07/23/13 1624 

## 2014-05-28 ENCOUNTER — Emergency Department (HOSPITAL_COMMUNITY)
Admission: EM | Admit: 2014-05-28 | Discharge: 2014-05-28 | Disposition: A | Discharge status: Home / Self Care | Payer: Non-veteran care | Attending: Emergency Medicine | Admitting: Emergency Medicine

## 2014-05-28 ENCOUNTER — Encounter (HOSPITAL_COMMUNITY): Payer: Self-pay | Admitting: Emergency Medicine

## 2014-05-28 ENCOUNTER — Emergency Department (HOSPITAL_COMMUNITY): Payer: Non-veteran care

## 2014-05-28 DIAGNOSIS — Z7982 Long term (current) use of aspirin: Secondary | ICD-10-CM | POA: Insufficient documentation

## 2014-05-28 DIAGNOSIS — Z880 Allergy status to penicillin: Secondary | ICD-10-CM | POA: Insufficient documentation

## 2014-05-28 DIAGNOSIS — I10 Essential (primary) hypertension: Secondary | ICD-10-CM | POA: Diagnosis not present

## 2014-05-28 DIAGNOSIS — R0602 Shortness of breath: Secondary | ICD-10-CM

## 2014-05-28 DIAGNOSIS — J441 Chronic obstructive pulmonary disease with (acute) exacerbation: Secondary | ICD-10-CM | POA: Insufficient documentation

## 2014-05-28 DIAGNOSIS — R0981 Nasal congestion: Secondary | ICD-10-CM | POA: Diagnosis present

## 2014-05-28 DIAGNOSIS — Z720 Tobacco use: Secondary | ICD-10-CM | POA: Diagnosis not present

## 2014-05-28 DIAGNOSIS — Z79899 Other long term (current) drug therapy: Secondary | ICD-10-CM | POA: Insufficient documentation

## 2014-05-28 DIAGNOSIS — J40 Bronchitis, not specified as acute or chronic: Secondary | ICD-10-CM

## 2014-05-28 DIAGNOSIS — E780 Pure hypercholesterolemia: Secondary | ICD-10-CM | POA: Diagnosis not present

## 2014-05-28 LAB — CBC WITH DIFFERENTIAL/PLATELET
BASOS PCT: 1 % (ref 0–1)
Basophils Absolute: 0 10*3/uL (ref 0.0–0.1)
EOS ABS: 0.4 10*3/uL (ref 0.0–0.7)
Eosinophils Relative: 6 % — ABNORMAL HIGH (ref 0–5)
HEMATOCRIT: 44.3 % (ref 39.0–52.0)
Hemoglobin: 14.8 g/dL (ref 13.0–17.0)
LYMPHS ABS: 2.7 10*3/uL (ref 0.7–4.0)
LYMPHS PCT: 35 % (ref 12–46)
MCH: 30.1 pg (ref 26.0–34.0)
MCHC: 33.4 g/dL (ref 30.0–36.0)
MCV: 90.2 fL (ref 78.0–100.0)
Monocytes Absolute: 1 10*3/uL (ref 0.1–1.0)
Monocytes Relative: 12 % (ref 3–12)
NEUTROS ABS: 3.7 10*3/uL (ref 1.7–7.7)
Neutrophils Relative %: 46 % (ref 43–77)
Platelets: 325 10*3/uL (ref 150–400)
RBC: 4.91 MIL/uL (ref 4.22–5.81)
RDW: 13.6 % (ref 11.5–15.5)
WBC: 7.8 10*3/uL (ref 4.0–10.5)

## 2014-05-28 LAB — BASIC METABOLIC PANEL
Anion gap: 7 (ref 5–15)
BUN: 15 mg/dL (ref 6–23)
CO2: 23 mmol/L (ref 19–32)
Calcium: 9.3 mg/dL (ref 8.4–10.5)
Chloride: 104 mEq/L (ref 96–112)
Creatinine, Ser: 0.93 mg/dL (ref 0.50–1.35)
GFR calc Af Amer: >90 mL/min (ref >90)
GFR, EST NON AFRICAN AMERICAN: 89 mL/min — AB (ref >90)
GLUCOSE: 136 mg/dL — AB (ref 70–99)
Potassium: 4.5 mmol/L (ref 3.5–5.1)
Sodium: 134 mmol/L — ABNORMAL LOW (ref 135–145)

## 2014-05-28 MED ORDER — PREDNISONE 10 MG PO TABS: 20.0000 mg | ORAL_TABLET | Freq: Every day | ORAL | Status: DC

## 2014-05-28 MED ORDER — IPRATROPIUM BROMIDE 0.02 % IN SOLN
0.5000 mg | Freq: Once | RESPIRATORY_TRACT | Status: AC
  Administered 2014-05-28: 0.5 mg via RESPIRATORY_TRACT
  Filled 2014-05-28: qty 2.5

## 2014-05-28 MED ORDER — ALBUTEROL SULFATE (2.5 MG/3ML) 0.083% IN NEBU
5.0000 mg | INHALATION_SOLUTION | Freq: Once | RESPIRATORY_TRACT | Status: AC
  Administered 2014-05-28: 5 mg via RESPIRATORY_TRACT
  Filled 2014-05-28: qty 6

## 2014-05-28 MED ORDER — PREDNISONE 20 MG PO TABS
60.0000 mg | ORAL_TABLET | Freq: Once | ORAL | Status: AC
  Administered 2014-05-28: 60 mg via ORAL
  Filled 2014-05-28: qty 3

## 2014-05-28 MED ORDER — SULFAMETHOXAZOLE-TRIMETHOPRIM 800-160 MG PO TABS: 1.0000 | ORAL_TABLET | Freq: Two times a day (BID) | ORAL | Status: DC

## 2014-05-28 MED ORDER — ALBUTEROL SULFATE HFA 108 (90 BASE) MCG/ACT IN AERS: 2.0000 | INHALATION_SPRAY | RESPIRATORY_TRACT | Status: DC | PRN

## 2014-05-28 NOTE — ED Notes (Signed)
Patient transported to X-ray 

## 2014-05-28 NOTE — ED Notes (Signed)
Per pt, states cold symptoms, congestion, SOB on and on for 3 weeks-no relief with OTC meds

## 2014-05-28 NOTE — Discharge Instructions (Signed)
Follow up if not improving

## 2014-05-28 NOTE — ED Provider Notes (Signed)
CSN: [id number]     Arrival date & time 05/28/14  4098 History   First MD Initiated Contact with Patient 05/28/14 [phone]     Chief Complaint  Patient presents with  . Nasal Congestion     (Consider location/radiation/quality/duration/timing/severity/associated sxs/prior Treatment) Patient is a 59 y.o. male presenting with cough. The history is provided by the patient (pt complains of cough and wheezing).  Cough Cough characteristics:  Productive Sputum characteristics:  Green Severity:  Moderate Onset quality:  Gradual Timing:  Constant Progression:  Waxing and waning Chronicity:  Recurrent Context: animal exposure   Associated symptoms: wheezing   Associated symptoms: no chest pain, no eye discharge, no headaches and no rash     Past Medical History  Diagnosis Date  . Asthma   . High cholesterol   . Hypertension   . COPD (chronic obstructive pulmonary disease)    History reviewed. No pertinent past surgical history. No family history on file. History  Substance Use Topics  . Smoking status: Current Every Day Smoker    Types: Cigarettes  . Smokeless tobacco: Not on file  . Alcohol Use: No    Review of Systems  Constitutional: Negative for appetite change and fatigue.  HENT: Negative for congestion, ear discharge and sinus pressure.   Eyes: Negative for discharge.  Respiratory: Positive for cough and wheezing.   Cardiovascular: Negative for chest pain.  Gastrointestinal: Negative for abdominal pain and diarrhea.  Genitourinary: Negative for frequency and hematuria.  Musculoskeletal: Negative for back pain.  Skin: Negative for rash.  Neurological: Negative for seizures and headaches.  Psychiatric/Behavioral: Negative for hallucinations.      Allergies  Penicillins  Home Medications   Prior to Admission medications   Medication Sig Start Date End Date Taking? Authorizing Provider  albuterol (PROVENTIL HFA;VENTOLIN HFA) 108 (90 BASE) MCG/ACT inhaler Inhale 2  puffs into the lungs every 6 (six) hours as needed for wheezing or shortness of breath.    Yes Historical Provider, MD  aspirin EC 81 MG tablet Take 162 mg by mouth daily with breakfast.    Yes Historical Provider, MD  Chlorphen-Phenyleph-ASA 2-7.8-325 MG TBEF Take 2 tablets by mouth 3 (three) times daily as needed (for cold).   Yes Historical Provider, MD  DM-Doxylamine-Acetaminophen 15-6.25-325 MG/15ML LIQD Take 2 capsules by mouth daily as needed (for cold/sleep).   Yes Historical Provider, MD  fish oil-omega-3 fatty acids 1000 MG capsule Take 1 g by mouth daily with breakfast.    Yes Historical Provider, MD  ibuprofen (ADVIL,MOTRIN) 200 MG tablet Take 800 mg by mouth every 6 (six) hours as needed for moderate pain.    Yes Historical Provider, MD  lisinopril (PRINIVIL,ZESTRIL) 20 MG tablet Take 20 mg by mouth every morning.    Yes Historical Provider, MD  ranitidine (ZANTAC) 150 MG tablet Take 150 mg by mouth 2 (two) times daily.   Yes Historical Provider, MD  simvastatin (ZOCOR) 80 MG tablet Take 80 mg by mouth at bedtime.   Yes Historical Provider, MD  HYDROcodone-acetaminophen (NORCO/VICODIN) 5-325 MG per tablet Take 2 tablets by mouth every 4 (four) hours as needed. Patient not taking: Reported on 05/28/2014 07/09/13   Irish Elders, NP  predniSONE (DELTASONE) 10 MG tablet Take 2 tablets (20 mg total) by mouth daily. 05/28/14   Benny Lennert, MD  sulfamethoxazole-trimethoprim (SEPTRA DS) 800-160 MG per tablet Take 1 tablet by mouth 2 (two) times daily. 05/28/14   Benny Lennert, MD   BP 131/82 mmHg  Pulse 67  Temp(Src) 99.1 F (37.3 C) (Oral)  Resp 16  Ht 5\' 9"  (1.753 m)  Wt 244 lb (110.678 kg)  BMI 36.02 kg/m2  SpO2 96% Physical Exam  Constitutional: He is oriented to person, place, and time. He appears well-developed.  HENT:  Head: Normocephalic.  Eyes: Conjunctivae and EOM are normal. No scleral icterus.  Neck: Neck supple. No thyromegaly present.  Cardiovascular: Normal rate and  regular rhythm.  Exam reveals no gallop and no friction rub.   No murmur heard. Pulmonary/Chest: No stridor. He has wheezes. He has no rales. He exhibits no tenderness.  Abdominal: He exhibits no distension. There is no tenderness. There is no rebound.  Musculoskeletal: Normal range of motion. He exhibits no edema.  Lymphadenopathy:    He has no cervical adenopathy.  Neurological: He is oriented to person, place, and time. He exhibits normal muscle tone. Coordination normal.  Skin: No rash noted. No erythema.  Psychiatric: He has a normal mood and affect. His behavior is normal.    ED Course  Procedures (including critical care time) Labs Review Labs Reviewed  CBC WITH DIFFERENTIAL - Abnormal; Notable for the following:    Eosinophils Relative 6 (*)    All other components within normal limits  BASIC METABOLIC PANEL - Abnormal; Notable for the following:    Sodium 134 (*)    Glucose, Bld 136 (*)    GFR calc non Af Amer 89 (*)    All other components within normal limits    Imaging Review Dg Chest 2 View  05/28/2014   CLINICAL DATA:  Cough, cold for 2 days, congestion, history COPD, hypertension, smoker  EXAM: CHEST  2 VIEW  COMPARISON:  08/30/2010  FINDINGS: Minimal enlargement of cardiac silhouette.  Mediastinal contours and pulmonary vascularity normal.  No definite infiltrate, pleural effusion or pneumothorax.  Bones unremarkable.  IMPRESSION: No acute abnormalities.  Minimal enlargement of cardiac silhouette.   Electronically Signed   By: Ulyses SouthwardMark  Boles M.D.   On: 05/28/2014 10:24     EKG Interpretation None      MDM   Final diagnoses:  SOB (shortness of breath)  Bronchitis    bronchtis and wheezing tx with bactrim,  Prednisone,  albuterol    Benny LennertJoseph L Raven Furnas, MD 05/28/14 1305

## 2014-05-28 NOTE — ED Notes (Signed)
MD at bedside. EDP ZAMMIT 

## 2015-07-14 ENCOUNTER — Emergency Department (HOSPITAL_COMMUNITY)
Admission: EM | Admit: 2015-07-14 | Discharge: 2015-07-14 | Disposition: A | Discharge status: Home / Self Care | Payer: Non-veteran care | Attending: Emergency Medicine | Admitting: Emergency Medicine

## 2015-07-14 ENCOUNTER — Encounter (HOSPITAL_COMMUNITY): Payer: Self-pay

## 2015-07-14 DIAGNOSIS — Z792 Long term (current) use of antibiotics: Secondary | ICD-10-CM | POA: Diagnosis not present

## 2015-07-14 DIAGNOSIS — Z7952 Long term (current) use of systemic steroids: Secondary | ICD-10-CM | POA: Insufficient documentation

## 2015-07-14 DIAGNOSIS — E7800 Pure hypercholesterolemia, unspecified: Secondary | ICD-10-CM | POA: Diagnosis not present

## 2015-07-14 DIAGNOSIS — Z79899 Other long term (current) drug therapy: Secondary | ICD-10-CM | POA: Diagnosis not present

## 2015-07-14 DIAGNOSIS — F17210 Nicotine dependence, cigarettes, uncomplicated: Secondary | ICD-10-CM | POA: Diagnosis not present

## 2015-07-14 DIAGNOSIS — I10 Essential (primary) hypertension: Secondary | ICD-10-CM | POA: Diagnosis not present

## 2015-07-14 DIAGNOSIS — L988 Other specified disorders of the skin and subcutaneous tissue: Secondary | ICD-10-CM | POA: Diagnosis present

## 2015-07-14 DIAGNOSIS — Z880 Allergy status to penicillin: Secondary | ICD-10-CM | POA: Diagnosis not present

## 2015-07-14 DIAGNOSIS — J449 Chronic obstructive pulmonary disease, unspecified: Secondary | ICD-10-CM | POA: Insufficient documentation

## 2015-07-14 DIAGNOSIS — L02415 Cutaneous abscess of right lower limb: Secondary | ICD-10-CM

## 2015-07-14 MED ORDER — SULFAMETHOXAZOLE-TRIMETHOPRIM 800-160 MG PO TABS: 1.0000 | ORAL_TABLET | Freq: Two times a day (BID) | ORAL | Status: DC

## 2015-07-14 NOTE — ED Provider Notes (Signed)
CSN: [id number]     Arrival date & time 07/14/15  4098 History   First MD Initiated Contact with Patient 07/14/15 [phone]     Chief Complaint  Patient presents with  . Recurrent Skin Infections     (Consider location/radiation/quality/duration/timing/severity/associated sxs/prior Treatment) HPI Comments: GUENTHER DUNSHEE is a 60 y.o. male with a PMHx of asthma, HTN, HLD, and COPD, who presents to the ED with complaints of abscess to his right inner thigh 4 days. Patient states that initially it was larger than it is today, but he has been soaking with warm Epsom salt soaks and the swelling has improved and yesterday it began spontaneously draining a yellowish drainage. He endorses some erythema and warmth to the area which has overall improved. He states that he has some minimal pain which she describes as 5/10 intermittent throbbing in the area of the abscess on his right thigh, nonradiating, worse with palpation, and improved with ibuprofen. He reports that overall his symptoms have improved but he came in today because he is concerned that he needs antibiotics in order to prevent any worsening infection. He has had prior abscesses in the past. He denies any known skin injury or insect bites, no immunosuppressive medications or conditions. He denies any red streaking, fevers, chills, chest pain, shortness breath, abdominal pain, nausea, vomiting, diarrhea, constipation, dysuria, hematuria, numbness, tingling, or focal weakness. His PCP is at the [facility] in [place].  Patient is a 60 y.o. male presenting with abscess. The history is provided by the patient and medical records. No language interpreter was used.  Abscess Location:  Leg Leg abscess location:  R upper leg Abscess quality: draining, painful, redness and warmth   Red streaking: no   Duration:  4 days Progression:  Improving Pain details:    Quality:  Throbbing   Severity:  Mild   Duration:  4 days   Timing:  Intermittent   Progression:   Improving Chronicity:  Recurrent Context: not diabetes, not immunosuppression, not insect bite/sting and not skin injury   Relieved by:  Warm compresses, warm water soaks and NSAIDs Worsened by:  Draining/squeezing Ineffective treatments:  None tried Associated symptoms: no fever, no nausea and no vomiting   Risk factors: prior abscess     Past Medical History  Diagnosis Date  . Asthma   . High cholesterol   . Hypertension   . COPD (chronic obstructive pulmonary disease) (HCC)    History reviewed. No pertinent past surgical history. History reviewed. No pertinent family history. Social History  Substance Use Topics  . Smoking status: Current Every Day Smoker    Types: Cigarettes  . Smokeless tobacco: None  . Alcohol Use: No    Review of Systems  Constitutional: Negative for fever and chills.  Respiratory: Negative for shortness of breath.   Cardiovascular: Negative for chest pain.  Gastrointestinal: Negative for nausea, vomiting, abdominal pain, diarrhea and constipation.  Genitourinary: Negative for dysuria and hematuria.  Musculoskeletal: Negative for myalgias and arthralgias.  Skin: Positive for color change and wound (abscess).  Allergic/Immunologic: Negative for immunocompromised state.  Neurological: Negative for weakness and numbness.  Psychiatric/Behavioral: Negative for confusion.   10 Systems reviewed and are negative for acute change except as noted in the HPI.    Allergies  Penicillins  Home Medications   Prior to Admission medications   Medication Sig Start Date End Date Taking? Authorizing Provider  albuterol (PROVENTIL HFA;VENTOLIN HFA) 108 (90 BASE) MCG/ACT inhaler Inhale 2 puffs into the lungs every 6 (  six) hours as needed for wheezing or shortness of breath.     Historical Provider, MD  aspirin EC 81 MG tablet Take 162 mg by mouth daily with breakfast.     Historical Provider, MD  Chlorphen-Phenyleph-ASA 2-7.8-325 MG TBEF Take 2 tablets by mouth 3  (three) times daily as needed (for cold).    Historical Provider, MD  DM-Doxylamine-Acetaminophen 15-6.25-325 MG/15ML LIQD Take 2 capsules by mouth daily as needed (for cold/sleep).    Historical Provider, MD  fish oil-omega-3 fatty acids 1000 MG capsule Take 1 g by mouth daily with breakfast.     Historical Provider, MD  HYDROcodone-acetaminophen (NORCO/VICODIN) 5-325 MG per tablet Take 2 tablets by mouth every 4 (four) hours as needed. Patient not taking: Reported on 05/28/2014 07/09/13   Irish Elders, NP  ibuprofen (ADVIL,MOTRIN) 200 MG tablet Take 800 mg by mouth every 6 (six) hours as needed for moderate pain.     Historical Provider, MD  lisinopril (PRINIVIL,ZESTRIL) 20 MG tablet Take 20 mg by mouth every morning.     Historical Provider, MD  predniSONE (DELTASONE) 10 MG tablet Take 2 tablets (20 mg total) by mouth daily. 05/28/14   Bethann Berkshire, MD  ranitidine (ZANTAC) 150 MG tablet Take 150 mg by mouth 2 (two) times daily.    Historical Provider, MD  simvastatin (ZOCOR) 80 MG tablet Take 80 mg by mouth at bedtime.    Historical Provider, MD  sulfamethoxazole-trimethoprim (SEPTRA DS) 800-160 MG per tablet Take 1 tablet by mouth 2 (two) times daily. 05/28/14   Bethann Berkshire, MD   BP 147/85 mmHg  Pulse 83  Temp(Src) 97.9 F (36.6 C) (Oral)  Resp 20  Ht  (1.753 m)  Wt 111.131 kg  BMI 36.16 kg/m2  SpO2 96% Physical Exam  Constitutional: He is oriented to person, place, and time. Vital signs are normal. He appears well-developed and well-nourished.  Non-toxic appearance. No distress.  Afebrile, nontoxic, NAD  HENT:  Head: Normocephalic and atraumatic.  Mouth/Throat: Mucous membranes are normal.  Eyes: Conjunctivae and EOM are normal. Right eye exhibits no discharge. Left eye exhibits no discharge.  Neck: Normal range of motion. Neck supple.  Cardiovascular: Normal rate and intact distal pulses.   Pulmonary/Chest: Effort normal. No respiratory distress.  Abdominal: Normal appearance.  He exhibits no distension.  Musculoskeletal: Normal range of motion.  Neurological: He is alert and oriented to person, place, and time. He has normal strength. No sensory deficit.  Skin: Skin is warm and dry. No rash noted. There is erythema.     R inner thigh with an abscess which has large opening in the center with some mucoid drainage at the center, with ~2cm of surrounding induration but no fluctuance, minimal erythema and warmth in the indurated area but no surrounding cellulitis.   Psychiatric: He has a normal mood and affect.  Nursing note and vitals reviewed.   ED Course  Procedures (including critical care time) Labs Review Labs Reviewed - No data to display  Imaging Review No results found. I have personally reviewed and evaluated these images and lab results as part of my medical decision-making.   EKG Interpretation None      MDM   Final diagnoses:  Abscess of leg, right    60 y.o. male here with R thigh abscess x4 days which spontaneously ruptured yesterday and has overall improved since onset but he was concerned that it could cause a worse infection if he didn't come get antibiotics. On exam, abscess has  large opening in the center with some mucoid drainage at the center, with ~2cm of surrounding induration but no fluctuance, minimal erythema and warmth in the indurated area. Does not appear to need I&D since it's draining spontaneously. Will start on abx to aid in the process. Discussed tylenol/motrin for pain. Pt does not have a PCP in the area because he sees [facility], who is difficult to get into on short notice, [facility] if he could have a PCP in this area and f/up closely with them for recheck of symptoms. Discussed continuing warm compresses. I explained the diagnosis and have given explicit precautions to return to the ER including for any other new or worsening symptoms. The patient understands and accepts the medical plan as it's been  dictated and I have answered their questions. Discharge instructions concerning home care and prescriptions have been given. The patient is STABLE and is discharged to home in good condition.    BP 147/85 mmHg  Pulse 83  Temp(Src) 97.9 F (36.6 C) (Oral)  Resp 20  Ht 5\' 9"  (1.753 m)  Wt 111.131 kg  BMI 36.16 kg/m2  SpO2 96%  Meds ordered this encounter  Medications  . sulfamethoxazole-trimethoprim (BACTRIM DS,SEPTRA DS) 800-160 MG tablet    Sig: Take 1 tablet by mouth 2 (two) times daily.    Dispense:  14 tablet    Refill:  0    Order Specific Question:  Supervising Provider    Answer:  Eber HongMILLER, BRIAN [[id number]]       Harleyquinn Gasser Camprubi-Soms, PA-C 07/14/15 [phone]  Gilda Creasehristopher J Pollina, MD 07/14/15 [phone]

## 2015-07-14 NOTE — ED Notes (Signed)
Pt complains of an abcess on his inner thigh for 4 days

## 2015-07-14 NOTE — ED Notes (Signed)
PA at bedside.

## 2015-07-14 NOTE — Discharge Instructions (Signed)
Keep wound clean and dry. Apply warm compresses to affected area throughout the day. Take antibiotic until it is finished. Take tylenol and motrin as needed for pain. Followup with [facility] in 5-7 days for wound recheck and to try to establish medical care in this area. Monitor area for signs of infection to include, but not limited to: increasing pain, spreading redness, drainage/pus, worsening swelling, or fevers. Return to emergency department for emergent changing or worsening symptoms.    Abscess An abscess (boil or furuncle) is an infected area on or under the skin. This area is filled with yellowish-white fluid (pus) and other material (debris). HOME CARE   Only take medicines as told by your doctor.  If you were given antibiotic medicine, take it as directed. Finish the medicine even if you start to feel better.  If gauze is used, follow your doctor's directions for changing the gauze.  To avoid spreading the infection:  Keep your abscess covered with a bandage.  Wash your hands well.  Do not share personal care items, towels, or whirlpools with others.  Avoid skin contact with others.  Keep your skin and clothes clean around the abscess.  Keep all doctor visits as told. GET HELP RIGHT AWAY IF:   You have more pain, puffiness (swelling), or redness in the wound site.  You have more fluid or blood coming from the wound site.  You have muscle aches, chills, or you feel sick.  You have a fever. MAKE SURE YOU:   Understand these instructions.  Will watch your condition.  Will get help right away if you are not doing well or get worse.   This information is not intended to replace advice given to you by your health care provider. Make sure you discuss any questions you have with your health care provider.   Document Released: 10/11/2007 Document Revised: 10/24/2011 Document Reviewed: 07/08/2011 Elsevier Interactive Patient Education [employer]2016 [employer]

## 2015-07-20 ENCOUNTER — Encounter (HOSPITAL_COMMUNITY): Payer: Self-pay

## 2015-07-20 ENCOUNTER — Emergency Department (HOSPITAL_COMMUNITY)
Admission: EM | Admit: 2015-07-20 | Discharge: 2015-07-20 | Disposition: A | Discharge status: Home / Self Care | Payer: Non-veteran care | Attending: Emergency Medicine | Admitting: Emergency Medicine

## 2015-07-20 DIAGNOSIS — Z880 Allergy status to penicillin: Secondary | ICD-10-CM | POA: Insufficient documentation

## 2015-07-20 DIAGNOSIS — F17210 Nicotine dependence, cigarettes, uncomplicated: Secondary | ICD-10-CM | POA: Insufficient documentation

## 2015-07-20 DIAGNOSIS — J45909 Unspecified asthma, uncomplicated: Secondary | ICD-10-CM | POA: Insufficient documentation

## 2015-07-20 DIAGNOSIS — Z7982 Long term (current) use of aspirin: Secondary | ICD-10-CM | POA: Insufficient documentation

## 2015-07-20 DIAGNOSIS — Z87442 Personal history of urinary calculi: Secondary | ICD-10-CM | POA: Insufficient documentation

## 2015-07-20 DIAGNOSIS — Z87448 Personal history of other diseases of urinary system: Secondary | ICD-10-CM | POA: Insufficient documentation

## 2015-07-20 DIAGNOSIS — Z792 Long term (current) use of antibiotics: Secondary | ICD-10-CM | POA: Insufficient documentation

## 2015-07-20 DIAGNOSIS — Z79899 Other long term (current) drug therapy: Secondary | ICD-10-CM | POA: Insufficient documentation

## 2015-07-20 DIAGNOSIS — I10 Essential (primary) hypertension: Secondary | ICD-10-CM | POA: Insufficient documentation

## 2015-07-20 DIAGNOSIS — J449 Chronic obstructive pulmonary disease, unspecified: Secondary | ICD-10-CM | POA: Insufficient documentation

## 2015-07-20 DIAGNOSIS — E7800 Pure hypercholesterolemia, unspecified: Secondary | ICD-10-CM | POA: Insufficient documentation

## 2015-07-20 DIAGNOSIS — T370X5A Adverse effect of sulfonamides, initial encounter: Secondary | ICD-10-CM | POA: Insufficient documentation

## 2015-07-20 DIAGNOSIS — R300 Dysuria: Secondary | ICD-10-CM | POA: Insufficient documentation

## 2015-07-20 DIAGNOSIS — T50905A Adverse effect of unspecified drugs, medicaments and biological substances, initial encounter: Secondary | ICD-10-CM

## 2015-07-20 DIAGNOSIS — L270 Generalized skin eruption due to drugs and medicaments taken internally: Secondary | ICD-10-CM | POA: Insufficient documentation

## 2015-07-20 HISTORY — DX: Disorder of kidney and ureter, unspecified: N28.9

## 2015-07-20 HISTORY — DX: Calculus of kidney: N20.0

## 2015-07-20 MED ORDER — LIDOCAINE VISCOUS 2 % MT SOLN
15.0000 mL | Freq: Once | OROMUCOSAL | Status: AC
  Administered 2015-07-20: 15 mL via OROMUCOSAL
  Filled 2015-07-20: qty 15

## 2015-07-20 NOTE — Discharge Instructions (Signed)
Drug Toxicity °Drug toxicity refers to harmful and unwanted (adverse) effects of a drug in your body. Drug toxicity often results from taking too much of a drug (overdose) by accident or on purpose. With some drugs, there is only a small difference between the dose that is needed to treat your condition and a dose that is harmful (narrow therapeutic range). However, any drug can be toxic at high doses, and even normal doses of certain drugs can be toxic for some people. These include over-the-counter (OTC) medicines. °Drug toxicity can happen suddenly when you first start taking a drug or when you suddenly take too much of a drug (acute toxicity). It can also happen as a result of taking a drug for a long period of time (chronic toxicity). The effects of drug toxicity can be mild, dangerous, or even deadly. °CAUSES °Many things can cause drug toxicity. Common causes of acute toxicity include a drug overdose or an allergic reaction to a drug. °Most drugs are broken down (metabolized) by your liver and eliminated (excreted) by your kidneys. Chronic drug toxicity can result from changes in the way that your body metabolizes a drug. This can happen, for example, if you weigh less than you did when you started taking a drug but you keep taking the same dose that you took at the heavier weight. °RISK FACTORS °You may have a higher risk for drug toxicity if you: °· Are under 18 years of age or over 65 years of age. °· Have liver disease, kidney disease, or another medical condition. °· Are taking more than one drug. °· Are pregnant. °· Are allergic to certain drugs. °· Have genes that cause you to be more affected by (susceptible to) certain drugs. °· Take a drug that has a narrow therapeutic range. °Certain types of drugs are more likely than others to cause toxicity. Many drugs have a narrow therapeutic range, including: °· Blood thinners. °· Heart medicines. °· Diabetes medicines. °· Medicines to prevent or stop  seizures. °· Theophylline for asthma. °· Lithium for bipolar disorder. °SYMPTOMS °Signs and symptoms of drug toxicity depend on the drug and the amount that was taken. They may start suddenly or develop gradually over time. °DIAGNOSIS °Drug toxicity may be diagnosed based on your symptoms. Some drugs have known side effects that suggest toxicity. It is important that you tell health care provider about all of the drugs that you are taking and whether you have ever had a reaction to a drug. °Your health care provider will do a physical exam. You may have tests to check for drug toxicity, including: °· Blood tests to measure the amount of the drug in your blood or to check for signs of kidney or liver damage. °· Urine tests. °· Other tests to check for organ damage. °TREATMENT °Treatment may include: °· Stopping the drug. °· Lowering the dose of the drug. °· Switching to a different drug. °You may also need treatment to stop or reverse the effects of the toxicity. These treatments depend on the drug that caused the toxicity, how severe the toxicity is, and which parts of your body are affected. °HOME CARE INSTRUCTIONS °· Take medicines only as directed by your health care provider. Always ask your health care provider to discuss the possible side effects of any new drug that you start taking. °· Keep a list of all of the drugs that you take, including over-the-counter medicines. Bring this list with you to all of your medical visits. °· Read   the drug inserts that come with your medicines.  Keep all follow-up visits as directed by your health care provider. This is important. SEEK MEDICAL CARE IF:  Your symptoms return.  You develop any new signs or symptoms when you are taking medicines.  You notice any signs that indicate that you are taking too much of your medicine, based on what your health care provider told you to watch for. SEEK IMMEDIATE MEDICAL CARE IF:  You have chest pain.  You have difficulty  breathing.  You have a loss of consciousness.   This information is not intended to replace advice given to you by your health care provider. Make sure you discuss any questions you have with your health care provider.   Document Released: 04/24/2005 Document Revised: 09/08/2014 Document Reviewed: 04/29/2014 Elsevier Interactive Patient Education 2016 [employer] Stevens-Johnson Syndrome Stevens-Johnson syndrome is a disorder of the mucous membranes and skin. This disorder causes these things to happen:  The mucous membranes become inflamed.  The top layer of skin dies and starts to shed. The more skin that dies, the more serious the disorder becomes.  The body loses fluids quickly.  The body loses its ability to keep germs out. This condition requires immediate treatment to prevent complications such as:  Too much fluid loss.  Blood infection.  Eye damage.  Skin damage and infection.  Vision loss, if the eyes are affected.  Damage to the lungs, heart, kidneys, or liver. CAUSES The most common cause of this condition is an allergic reaction to a medicine. Medicines that are known to cause this condition include:  Antibiotic medicines.  Antiseizure medicines.  Medicine that is used to treat gout.  Cocaine.  NSAIDs. This condition can also be caused by an infection. In some cases, the cause may not be known. RISK FACTORS This condition is more likely to develop in:  People who have a variation in the HLA gene. This variation may be passed down through families (inherited).  People who have a family history of Stevens-Johnson syndrome.  People who have cancer or are having cancer treatment.  People who have a weak body defense system (immune system).  People who have systemic lupus erythematosus.  People of [place], [place], or [place] descent. SYMPTOMS This condition often begins with several days of flu-like symptoms, such as:  Fever.  Sore  throat.  Fatigue.  Headache.  Muscle aches.  Dry cough.  Burning feeling in the eyes. Then, a painful red or purple rash may develop on the face, trunk, palms, or soles, and spread to other parts of the body. The rash creates blisters and open sores on the skin. If the mucous membranes are affected, the rash may be in:  The mouth.  The nose.  The eyes.  The genitals.  The digestive tract.  The urinary tract. Other signs and symptoms include:  Shedding of the skin or mucous membrane.  Swelling of the tongue and face.  Swelling and itching of the skin (hives).  Redness, sensitivity to light, and dryness in the eyes.  Pain in the mouth and throat.  Pain when passing urine.  Pain when swallowing. DIAGNOSIS This condition is diagnosed with a physical exam. Your health care provider may also do tests, such as:  A biopsy. This involves removing a sample of skin or eye tissue to be looked at under a microscope.  Blood tests  Imaging tests. If you are having any eye symptoms, you may need to be seen by an  eye specialist (ophthalmologist). TREATMENT This condition may be treated by:  Stopping medicines that you are currently taking.  Getting fluids and nourishment through an IV tube or through a tube that is passed through your nose and into your stomach (nasogastric tube).  Gently removing dead skin and putting a moist bandage (dressing) on those areas.  Applying eye drops or having eye surgery.  Using a mouthwash that numbs the mouth and throat to help with swallowing.  Medicines:  To help you relax (sedatives).  To control your pain.  To fight infection (antibiotics).  To stop skin swelling and itching. HOME CARE INSTRUCTIONS Medicines  Take over-the-counter and prescription medicines only as told by your health care provider.  If you were prescribed an antibiotic medicine, take it as told by your health care provider. Do not stop taking the  antibiotic even if you start to feel better.  If a medicine triggered your condition, talk with your health care provider before you take the medicine again. Do not take it if your health care provider tells you not to.  Do not start taking any new medicines before you ask your health care provider if they are safe for you. 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 If the condition was caused by a medicine, always tell your health care providers which medicine caused it.  Wear a medical bracelet or necklace that says that you had this condition and tells the cause of it.  Ask your health care provider if you should be tested for the HLA gene.  Keep all follow-up visits as told by your health care provider. This is important. SEEK MEDICAL CARE IF:  You have trouble managing complications of the condition. SEEK IMMEDIATE MEDICAL CARE IF:  You have flu-like symptoms after you have an infection or after you start a new medicine.  You develop symptoms on your skin or mucous membranes again.   This information is not intended to replace advice given to you by your health care provider. Make sure you discuss any questions you have with your health care provider.   Document Released: 01/05/2011 Document Revised: 01/13/2015 Document Reviewed: 07/15/2014 Elsevier Interactive Patient Education [employer].

## 2015-07-20 NOTE — ED Provider Notes (Signed)
CSN: [id number]     Arrival date & time 07/20/15  9562 History   First MD Initiated Contact with Patient 07/20/15 1354     Chief Complaint  Patient presents with  . Medication Reaction  . Dysuria     (Consider location/radiation/quality/duration/timing/severity/associated sxs/prior Treatment) HPI Patient was seen on 3/84 right groin abscess. Prescribed Bactrim which the patient states he's never taken before. States he took 5 doses and began having rash to his chest, back and left lower extremity. States the rash started as a fluid-filled sac which then crusted over. He also complains to soreness to the left side of his tongue. Denies any difficulty breathing, nausea or vomiting, facial swelling. Past Medical History  Diagnosis Date  . Asthma   . High cholesterol   . Hypertension   . COPD (chronic obstructive pulmonary disease) (HCC)   . Renal disorder   . Kidney stones    Past Surgical History  Procedure Laterality Date  . Lithrotripsy     Family History  Problem Relation Age of Onset  . Hypertension Mother   . Diabetes Mother    Social History  Substance Use Topics  . Smoking status: Current Every Day Smoker    Types: Cigarettes  . Smokeless tobacco: Never Used  . Alcohol Use: No    Review of Systems  Constitutional: Negative for fever and chills.  HENT: Negative for facial swelling.   Respiratory: Negative for shortness of breath and wheezing.   Cardiovascular: Negative for chest pain.  Gastrointestinal: Negative for nausea, vomiting, abdominal pain and diarrhea.  Musculoskeletal: Negative for myalgias, back pain, neck pain and neck stiffness.  Skin: Positive for rash. Negative for pallor and wound.  Neurological: Negative for dizziness, weakness, light-headedness, numbness and headaches.  All other systems reviewed and are negative.     Allergies  Penicillins  Home Medications   Prior to Admission medications   Medication Sig Start Date End Date Taking?  Authorizing Provider  albuterol (PROVENTIL HFA;VENTOLIN HFA) 108 (90 BASE) MCG/ACT inhaler Inhale 2 puffs into the lungs every 6 (six) hours as needed for wheezing or shortness of breath.    Yes Historical Provider, MD  aspirin EC 81 MG tablet Take 162 mg by mouth daily with breakfast.    Yes Historical Provider, MD  fish oil-omega-3 fatty acids 1000 MG capsule Take 1 g by mouth daily with breakfast.    Yes Historical Provider, MD  ibuprofen (ADVIL,MOTRIN) 200 MG tablet Take 800 mg by mouth every 6 (six) hours as needed for moderate pain.    Yes Historical Provider, MD  lisinopril (PRINIVIL,ZESTRIL) 20 MG tablet Take 20 mg by mouth every morning.    Yes Historical Provider, MD  ranitidine (ZANTAC) 150 MG tablet Take 150 mg by mouth 2 (two) times daily.   Yes Historical Provider, MD  simvastatin (ZOCOR) 80 MG tablet Take 80 mg by mouth at bedtime.   Yes Historical Provider, MD  sulfamethoxazole-trimethoprim (BACTRIM DS,SEPTRA DS) 800-160 MG tablet Take 1 tablet by mouth 2 (two) times daily. 07/14/15  Yes Mercedes Camprubi-Soms, PA-C  DM-Doxylamine-Acetaminophen 15-6.25-325 MG/15ML LIQD Take 2 capsules by mouth daily as needed (for cold/sleep).    Historical Provider, MD  HYDROcodone-acetaminophen (NORCO/VICODIN) 5-325 MG per tablet Take 2 tablets by mouth every 4 (four) hours as needed. Patient not taking: Reported on 05/28/2014 07/09/13   Irish Elders, NP  predniSONE (DELTASONE) 10 MG tablet Take 2 tablets (20 mg total) by mouth daily. Patient not taking: Reported on 07/20/2015 05/28/14  Bethann BerkshireJoseph Zammit, MD  sulfamethoxazole-trimethoprim (SEPTRA DS) 800-160 MG per tablet Take 1 tablet by mouth 2 (two) times daily. Patient not taking: Reported on 07/20/2015 05/28/14   Bethann BerkshireJoseph Zammit, MD   BP 109/79 mmHg  Pulse 80  Temp(Src) 97.9 F (36.6 C) (Oral)  Resp 20  SpO2 96% Physical Exam  Constitutional: He is oriented to person, place, and time. He appears well-developed and well-nourished. No distress.  HENT:   Head: Normocephalic and atraumatic.  Mouth/Throat: Oropharynx is clear and moist. No oropharyngeal exudate.  Patient with small ulceration to the lateral of the tongue on the left. no intraoral swelling  Eyes: EOM are normal. Pupils are equal, round, and reactive to light.  Neck: Normal range of motion. Neck supple.  No meningismus  Cardiovascular: Normal rate and regular rhythm.  Exam reveals no gallop and no friction rub.   No murmur heard. Pulmonary/Chest: Effort normal and breath sounds normal. No stridor. No respiratory distress. He has no wheezes. He has no rales. He exhibits no tenderness.  Abdominal: Soft. Bowel sounds are normal. He exhibits no distension and no mass. There is no tenderness. There is no rebound and no guarding.  Musculoskeletal: Normal range of motion. He exhibits no edema or tenderness.  Right groin  with no fluctuance, tenderness or erythema  Neurological: He is alert and oriented to person, place, and time.  5/5 motor in all extremities. Sensation is fully intact. Patient able to without difficulty.  Skin: Skin is warm and dry. Rash noted. No erythema.  Patient has round lesion to the lateral left  Calf and 2 to the left thoracic back. Well-demarcated with crust overlying. Lesions are erythematous. Tenderness to palpation. No definite evidence of infection.  Psychiatric: He has a normal mood and affect. His behavior is normal.  Nursing note and vitals reviewed.   ED Course  Procedures (including critical care time) Labs Review Labs Reviewed - No data to display  Imaging Review No results found. I have personally reviewed and evaluated these images and lab results as part of my medical decision-making.   EKG Interpretation None      MDM   Final diagnoses:  Medication reaction, initial encounter    Patient with likely reaction to Bactrim. Question Viviann SpareSteven Johnson's like rash with intraoral involvement. Patient is very well-appearing. States the rash  and symptoms have been improving. Vital signs are stable in the emergency department. He is stop taking the Bactrim. Have given return precautions and voiced understanding.    Loren Raceravid Latana Colin, MD 07/20/15 [phone]

## 2015-07-20 NOTE — ED Notes (Signed)
Patient reports that he was seen 07/14/15 for an abscess and was prescribed SMZ-TMP DS 880/150 mg. Patient reports that his tongue is sore, has a rash, and a headache since starting the antibiotic.

## 2016-05-13 ENCOUNTER — Emergency Department (HOSPITAL_COMMUNITY)
Admission: EM | Admit: 2016-05-13 | Discharge: 2016-05-13 | Disposition: A | Discharge status: Home / Self Care | Payer: Non-veteran care | Attending: Emergency Medicine | Admitting: Emergency Medicine

## 2016-05-13 ENCOUNTER — Encounter (HOSPITAL_COMMUNITY): Payer: Self-pay | Admitting: *Deleted

## 2016-05-13 DIAGNOSIS — J449 Chronic obstructive pulmonary disease, unspecified: Secondary | ICD-10-CM | POA: Diagnosis not present

## 2016-05-13 DIAGNOSIS — Z7982 Long term (current) use of aspirin: Secondary | ICD-10-CM | POA: Insufficient documentation

## 2016-05-13 DIAGNOSIS — J0110 Acute frontal sinusitis, unspecified: Secondary | ICD-10-CM | POA: Diagnosis not present

## 2016-05-13 DIAGNOSIS — Z79899 Other long term (current) drug therapy: Secondary | ICD-10-CM | POA: Insufficient documentation

## 2016-05-13 DIAGNOSIS — F17210 Nicotine dependence, cigarettes, uncomplicated: Secondary | ICD-10-CM | POA: Diagnosis not present

## 2016-05-13 DIAGNOSIS — I10 Essential (primary) hypertension: Secondary | ICD-10-CM | POA: Diagnosis not present

## 2016-05-13 DIAGNOSIS — J029 Acute pharyngitis, unspecified: Secondary | ICD-10-CM | POA: Diagnosis present

## 2016-05-13 MED ORDER — PREDNISONE 20 MG PO TABS: 60.0000 mg | ORAL_TABLET | Freq: Once | ORAL | Status: DC

## 2016-05-13 MED ORDER — KETOROLAC TROMETHAMINE 60 MG/2ML IM SOLN: 60.0000 mg | Freq: Once | INTRAMUSCULAR | Status: DC

## 2016-05-13 MED ORDER — PREDNISONE 10 MG (21) PO TBPK: ORAL_TABLET | ORAL | 0 refills | Status: DC

## 2016-05-13 MED ORDER — DOXYCYCLINE HYCLATE 100 MG PO CAPS: 100.0000 mg | ORAL_CAPSULE | Freq: Two times a day (BID) | ORAL | 0 refills | Status: DC

## 2016-05-13 NOTE — ED Triage Notes (Signed)
Called from front lobby  X 2 no answer.

## 2016-05-13 NOTE — Discharge Instructions (Addendum)
You have evidence of sinus infection. Sinus infections typically are caused by viruses and do not require antibiotics. However, due to the lack of reliable follow-up, antibiotics will be initiated today.   Please take all of your antibiotics until finished!   You may develop abdominal discomfort or diarrhea from the antibiotic.  You may help offset this with probiotics which you can buy or get in yogurt. Do not eat or take the probiotics until 2 hours after your antibiotic.   Please also initiate the sinus rinses, the directions for which are included in this packet. Ibuprofen or naproxen for pain. The maximum amount of ibuprofen you may take is 800 mg every 8 hours. The maximum amount of naproxen is 500 mg every 12 hours. Decongestants such as Mucinex or phenylephrine (Sudafed) may also be helpful.  Follow-up with a primary care provider should symptoms fail to resolve.

## 2016-05-13 NOTE — ED Notes (Signed)
Declined W/C at D/C and was escorted to lobby by RN. 

## 2016-05-13 NOTE — ED Triage Notes (Signed)
Pt states nasal congestion and sore throat since Wed.

## 2016-05-13 NOTE — ED Provider Notes (Signed)
[facility]-EMERGENCY DEPT Provider Note   CSN: [id number] Arrival date & time: 05/13/16  0753     History   Chief Complaint Chief Complaint  Patient presents with  . Sore Throat  . Nasal Congestion    HPI Dennis Dominguez is a 61 y.o. male.  HPI   Dennis Dominguez is a 61 y.o. male, with a history of Asthma, HTN, and COPD, presenting to the ED with Sinus congestion, postnasal drip, sore throat, and frontal headache for the past 3 days. Headache is moderate, bilateral, feels like a pressure, nonradiating. Patient's PCP is at the [facility] in [place] and he has difficulty getting there as he has no reliable transportation.  Denies dizziness, fever/chills, difficulty breathing or swallowing, or any other complaints.    Past Medical History:  Diagnosis Date  . Asthma   . COPD (chronic obstructive pulmonary disease) (HCC)   . High cholesterol   . Hypertension   . Kidney stones   . Renal disorder     There are no active problems to display for this patient.   Past Surgical History:  Procedure Laterality Date  . lithrotripsy         Home Medications    Prior to Admission medications   Medication Sig Start Date End Date Taking? Authorizing Provider  albuterol (PROVENTIL HFA;VENTOLIN HFA) 108 (90 BASE) MCG/ACT inhaler Inhale 2 puffs into the lungs every 6 (six) hours as needed for wheezing or shortness of breath.     Historical Provider, MD  aspirin EC 81 MG tablet Take 162 mg by mouth daily with breakfast.     Historical Provider, MD  DM-Doxylamine-Acetaminophen 15-6.25-325 MG/15ML LIQD Take 2 capsules by mouth daily as needed (for cold/sleep).    Historical Provider, MD  doxycycline (VIBRAMYCIN) 100 MG capsule Take 1 capsule (100 mg total) by mouth 2 (two) times daily. 05/13/16   Shawn C Joy, PA-C  fish oil-omega-3 fatty acids 1000 MG capsule Take 1 g by mouth daily with breakfast.     Historical Provider, MD  HYDROcodone-acetaminophen (NORCO/VICODIN) 5-325 MG per tablet Take 2  tablets by mouth every 4 (four) hours as needed. Patient not taking: Reported on 05/28/2014 07/09/13   Irish Elders, FNP  ibuprofen (ADVIL,MOTRIN) 200 MG tablet Take 800 mg by mouth every 6 (six) hours as needed for moderate pain.     Historical Provider, MD  lisinopril (PRINIVIL,ZESTRIL) 20 MG tablet Take 20 mg by mouth every morning.     Historical Provider, MD  predniSONE (STERAPRED UNI-PAK 21 TAB) 10 MG (21) TBPK tablet Take 6 tabs day 1, 5 tabs day 2, 4 tabs day 3, 3 tabs day 4, 2 tabs day 5, and 1 tab on day 6. 05/13/16   Shawn C Joy, PA-C  ranitidine (ZANTAC) 150 MG tablet Take 150 mg by mouth 2 (two) times daily.    Historical Provider, MD  simvastatin (ZOCOR) 80 MG tablet Take 80 mg by mouth at bedtime.    Historical Provider, MD    Family History Family History  Problem Relation Age of Onset  . Hypertension Mother   . Diabetes Mother     Social History Social History  Substance Use Topics  . Smoking status: Current Every Day Smoker    Packs/day: 2.00    Types: Cigarettes  . Smokeless tobacco: Never Used  . Alcohol use No     Allergies   Penicillins and Bactrim [sulfamethoxazole-trimethoprim]   Review of Systems Review of Systems  Constitutional: Negative for chills and  fever.  HENT: Positive for congestion, postnasal drip, sinus pain, sinus pressure and sore throat. Negative for facial swelling.   Respiratory: Negative for cough and shortness of breath.   All other systems reviewed and are negative.    Physical Exam Updated Vital Signs BP 154/95 (BP Location: Left Arm)   Pulse 86   Temp 97.8 F (36.6 C) (Oral)   Resp 22   Ht 5\' 9"  (1.753 m)   Wt 112.5 kg   SpO2 97%   BMI 36.62 kg/m   Physical Exam  Constitutional: He appears well-developed and well-nourished. No distress.  HENT:  Head: Normocephalic and atraumatic.  Mouth/Throat: Uvula is midline and oropharynx is clear and moist.  Tenderness over the bilateral frontal sinuses. Nasal congestion evident.    Eyes: Conjunctivae are normal.  Neck: Neck supple.  Cardiovascular: Normal rate, regular rhythm, normal heart sounds and intact distal pulses.   Pulmonary/Chest: Effort normal and breath sounds normal. No respiratory distress.  Abdominal: Soft. There is no tenderness. There is no guarding.  Musculoskeletal: He exhibits no edema.  Lymphadenopathy:    He has no cervical adenopathy.  Neurological: He is alert.  Skin: Skin is warm and dry. He is not diaphoretic.  Psychiatric: He has a normal mood and affect. His behavior is normal.  Nursing note and vitals reviewed.    ED Treatments / Results  Labs (all labs ordered are listed, but only abnormal results are displayed) Labs Reviewed - No data to display  EKG  EKG Interpretation None       Radiology No results found.  Procedures Procedures (including critical care time)  Medications Ordered in ED Medications  predniSONE (DELTASONE) tablet 60 mg (60 mg Oral Not Given 05/13/16 0943)  ketorolac (TORADOL) injection 60 mg (60 mg Intramuscular Not Given 05/13/16 0944)     Initial Impression / Assessment and Plan / ED Course  I have reviewed the triage vital signs and the nursing notes.  Pertinent labs & imaging results that were available during my care of the patient were reviewed by me and considered in my medical decision making (see chart for details).  Clinical Course     Patient presents with sinus congestion and pressure. Headache consistent with sinus headache. No signs of sepsis or superinfection. Antibiotic regimen initiated due to lack of good follow-up. Additional symptomatic care and return precautions discussed. Patient voiced understanding of all instructions and is comfortable with discharge.  Vitals:   05/13/16 0755 05/13/16 0756  BP:  154/95  Pulse:  86  Resp: 20 22  Temp:  97.8 F (36.6 C)  TempSrc: Oral Oral  SpO2:  97%  Weight:  112.5 kg  Height:  5\' 9"  (1.753 m)     Final Clinical Impressions(s)  / ED Diagnoses   Final diagnoses:  Acute non-recurrent frontal sinusitis    New Prescriptions Discharge Medication List as of 05/13/2016  8:54 AM    START taking these medications   Details  doxycycline (VIBRAMYCIN) 100 MG capsule Take 1 capsule (100 mg total) by mouth 2 (two) times daily., Starting Sat 05/13/2016, Print    predniSONE (STERAPRED UNI-PAK 21 TAB) 10 MG (21) TBPK tablet Take 6 tabs day 1, 5 tabs day 2, 4 tabs day 3, 3 tabs day 4, 2 tabs day 5, and 1 tab on day 6., Print         Anselm PancoastShawn C Joy, PA-C 05/13/16 1212    Nira ConnPedro Eduardo Cardama, MD 05/13/16 1711

## 2016-06-15 ENCOUNTER — Emergency Department (HOSPITAL_COMMUNITY): Payer: Non-veteran care

## 2016-06-15 ENCOUNTER — Emergency Department (HOSPITAL_COMMUNITY)
Admission: EM | Admit: 2016-06-15 | Discharge: 2016-06-15 | Disposition: A | Discharge status: Home / Self Care | Payer: Non-veteran care | Attending: Emergency Medicine | Admitting: Emergency Medicine

## 2016-06-15 ENCOUNTER — Encounter (HOSPITAL_COMMUNITY): Payer: Self-pay | Admitting: Emergency Medicine

## 2016-06-15 DIAGNOSIS — I10 Essential (primary) hypertension: Secondary | ICD-10-CM | POA: Diagnosis not present

## 2016-06-15 DIAGNOSIS — Z7982 Long term (current) use of aspirin: Secondary | ICD-10-CM | POA: Diagnosis not present

## 2016-06-15 DIAGNOSIS — Y999 Unspecified external cause status: Secondary | ICD-10-CM | POA: Diagnosis not present

## 2016-06-15 DIAGNOSIS — F17210 Nicotine dependence, cigarettes, uncomplicated: Secondary | ICD-10-CM | POA: Diagnosis not present

## 2016-06-15 DIAGNOSIS — Y929 Unspecified place or not applicable: Secondary | ICD-10-CM | POA: Diagnosis not present

## 2016-06-15 DIAGNOSIS — S86811A Strain of other muscle(s) and tendon(s) at lower leg level, right leg, initial encounter: Secondary | ICD-10-CM | POA: Diagnosis not present

## 2016-06-15 DIAGNOSIS — M25561 Pain in right knee: Secondary | ICD-10-CM

## 2016-06-15 DIAGNOSIS — Z79899 Other long term (current) drug therapy: Secondary | ICD-10-CM | POA: Diagnosis not present

## 2016-06-15 DIAGNOSIS — X501XXA Overexertion from prolonged static or awkward postures, initial encounter: Secondary | ICD-10-CM | POA: Insufficient documentation

## 2016-06-15 DIAGNOSIS — S86911A Strain of unspecified muscle(s) and tendon(s) at lower leg level, right leg, initial encounter: Secondary | ICD-10-CM

## 2016-06-15 DIAGNOSIS — S8991XA Unspecified injury of right lower leg, initial encounter: Secondary | ICD-10-CM | POA: Diagnosis present

## 2016-06-15 DIAGNOSIS — Y9389 Activity, other specified: Secondary | ICD-10-CM | POA: Diagnosis not present

## 2016-06-15 DIAGNOSIS — J449 Chronic obstructive pulmonary disease, unspecified: Secondary | ICD-10-CM | POA: Insufficient documentation

## 2016-06-15 MED ORDER — NAPROXEN 500 MG PO TABS: 500.0000 mg | ORAL_TABLET | Freq: Two times a day (BID) | ORAL | 0 refills | Status: DC

## 2016-06-15 MED ORDER — OXYCODONE-ACETAMINOPHEN 5-325 MG PO TABS
1.0000 | ORAL_TABLET | Freq: Once | ORAL | Status: AC
  Administered 2016-06-15: 1 via ORAL
  Filled 2016-06-15: qty 1

## 2016-06-15 NOTE — ED Provider Notes (Signed)
[facility]Y DEPT Provider Note   CSN: [id number] Arrival date & time: 06/15/16  0906  By signing my name below, I, Majel Homer, attest that this documentation has been prepared under the direction and in the presence of Audry Pili, PA-C. Electronically Signed: Majel Homer, Scribe. 06/15/2016. 11:30 AM.  History   Chief Complaint Chief Complaint  Patient presents with  . Knee Pain   The history is provided by the patient. No language interpreter was used.   HPI Comments: Dennis Dominguez is a 61 y.o. male with PMHx of HTN and renal disorder, who presents to the Emergency Department complaining of gradually worsening, right knee pain that began last night. Pt reports he was "taking his trash out" last night when he suddenly felt a "pop" in his right knee. He denies falling after this incident and believes he did not twist his knee. Pt reports associated joint swelling and states his pain is exacerbated with movement. He notes he took one dose of ibuprofen 800 mg yesterday without any relief of his pain. 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 Pt denies any fever.   Past Medical History:  Diagnosis Date  . Asthma   . COPD (chronic obstructive pulmonary disease) (HCC)   . High cholesterol   . Hypertension   . Kidney stones   . Renal disorder    There are no active problems to display for this patient.  Past Surgical History:  Procedure Laterality Date  . lithrotripsy      Home Medications    Prior to Admission medications   Medication Sig Start Date End Date Taking? Authorizing Provider  albuterol (PROVENTIL HFA;VENTOLIN HFA) 108 (90 BASE) MCG/ACT inhaler Inhale 2 puffs into the lungs every 6 (six) hours as needed for wheezing or shortness of breath.     Historical Provider, MD  aspirin EC 81 MG tablet Take 162 mg by mouth daily with breakfast.     Historical Provider, MD  DM-Doxylamine-Acetaminophen 15-6.25-325 MG/15ML LIQD Take  2 capsules by mouth daily as needed (for cold/sleep).    Historical Provider, MD  doxycycline (VIBRAMYCIN) 100 MG capsule Take 1 capsule (100 mg total) by mouth 2 (two) times daily. 05/13/16   Shawn C Joy, PA-C  fish oil-omega-3 fatty acids 1000 MG capsule Take 1 g by mouth daily with breakfast.     Historical Provider, MD  HYDROcodone-acetaminophen (NORCO/VICODIN) 5-325 MG per tablet Take 2 tablets by mouth every 4 (four) hours as needed. Patient not taking: Reported on 05/28/2014 07/09/13   Irish Elders, FNP  ibuprofen (ADVIL,MOTRIN) 200 MG tablet Take 800 mg by mouth every 6 (six) hours as needed for moderate pain.     Historical Provider, MD  lisinopril (PRINIVIL,ZESTRIL) 20 MG tablet Take 20 mg by mouth every morning.     Historical Provider, MD  predniSONE (STERAPRED UNI-PAK 21 TAB) 10 MG (21) TBPK tablet Take 6 tabs day 1, 5 tabs day 2, 4 tabs day 3, 3 tabs day 4, 2 tabs day 5, and 1 tab on day 6. 05/13/16   Shawn C Joy, PA-C  ranitidine (ZANTAC) 150 MG tablet Take 150 mg by mouth 2 (two) times daily.    Historical Provider, MD  simvastatin (ZOCOR) 80 MG tablet Take 80 mg by mouth at bedtime.    Historical Provider, MD    Family History Family History  Problem Relation Age of Onset  . Hypertension Mother   .  Diabetes Mother     Social History Social History  Substance Use Topics  . Smoking status: Current Every Day Smoker    Packs/day: 2.00    Types: Cigarettes  . Smokeless tobacco: Never Used  . Alcohol use No   Allergies   Penicillins and Bactrim [sulfamethoxazole-trimethoprim]  Review of Systems Review of Systems  Constitutional: Negative for fever.  Musculoskeletal: Positive for arthralgias and joint swelling.   Physical Exam Updated Vital Signs BP 132/86 (BP Location: Right Arm)   Pulse 86   Temp 97.8 F (36.6 C) (Oral)   Resp 20   SpO2 96%   Physical Exam  Constitutional: He is oriented to person, place, and time. Vital signs are normal. He appears well-developed  and well-nourished.  HENT:  Head: Normocephalic.  Right Ear: Hearing normal.  Left Ear: Hearing normal.  Eyes: Conjunctivae and EOM are normal. Pupils are equal, round, and reactive to light.  Neck: Normal range of motion.  Cardiovascular: Normal rate and regular rhythm.   Pulmonary/Chest: Effort normal.  Abdominal: He exhibits no distension.  Musculoskeletal: Normal range of motion.  Right Knee: Negative anterior/poster drawer bilaterally. Negative ballottement test. No varus or valgus laxity. No crepitus. No pain with flexion or extension. TTP of lateral aspect of knee.   Neurological: He is alert and oriented to person, place, and time.  Skin: Skin is warm and dry.  Psychiatric: He has a normal mood and affect. His speech is normal and behavior is normal. Thought content normal.  Nursing note and vitals reviewed.  ED Treatments / Results  DIAGNOSTIC STUDIES:  Oxygen Saturation is 96% on RA, normal by my interpretation.    COORDINATION OF CARE:  11:30 AM Discussed treatment plan with pt at bedside and pt agreed to plan.  Labs (all labs ordered are listed, but only abnormal results are displayed) Labs Reviewed - No data to display  EKG  EKG Interpretation None       Radiology Dg Knee Complete 4 Views Right  Result Date: 06/15/2016 CLINICAL DATA:  Yesterday right knee popped while walking. EXAM: RIGHT KNEE - COMPLETE 4+ VIEW COMPARISON:  None. FINDINGS: The right knee demonstrates no acute fracture or dislocation. There is a moderate joint effusion. There is no aggressive osseous lesion. The soft tissues are normal. IMPRESSION: 1.  No acute osseous injury of the right knee. 2. Moderate right knee joint effusion. Electronically Signed   By: Elige Ko   On: 06/15/2016 10:03    Procedures Procedures (including critical care time)  Medications Ordered in ED Medications - No data to display  Initial Impression / Assessment and Plan / ED Course  I have reviewed the  triage vital signs and the nursing notes.  Pertinent labs & imaging results that were available during my care of the patient were reviewed by me and considered in my medical decision making (see chart for details).  Final Clinical Impressions(s) / ED Diagnoses   {I have reviewed and evaluated the relevant imaging studies.  {I have reviewed the relevant previous healthcare records.  {I obtained HPI from historian.   ED Course:  Assessment: Patient X-Ray negative for obvious fracture or dislocation.  Pt advised to follow up with orthopedics. Patient given brace while in ED, conservative therapy recommended and discussed. Patient will be discharged home & is agreeable with above plan. Returns precautions discussed. Pt appears safe for discharge.  Disposition/Plan:  DC Home Additional Verbal discharge instructions given and discussed with patient.  Pt Instructed to f/u  with PCP in the next week for evaluation and treatment of symptoms. Return precautions given Pt acknowledges and agrees with plan   Final diagnoses:  Acute pain of right knee  Strain of right knee, initial encounter    New Prescriptions New Prescriptions   No medications on file       Audry Piliyler Macedonio Scallon, PA-C 06/15/16 1219    Lavera Guiseana Duo Liu, MD 06/15/16 [phone]

## 2016-06-15 NOTE — ED Triage Notes (Signed)
Pt sts right knee pain since feeling "pop" yesterday

## 2016-06-15 NOTE — Discharge Instructions (Signed)
Please read and follow all provided instructions.  Your diagnoses today include:  1. Acute pain of right knee   2. Strain of right knee, initial encounter     Tests performed today include: Vital signs. See below for your results today.   Medications prescribed:  Take as prescribed   Home care instructions:  Follow any educational materials contained in this packet.  Follow-up instructions: Please follow-up with your Orhtopedic provider for further evaluation of symptoms and treatment   Return instructions:  Please return to the Emergency Department if you do not get better, if you get worse, or new symptoms OR  - Fever (temperature greater than 101.69F)  - Bleeding that does not stop with holding pressure to the area    -Severe pain (please note that you may be more sore the day after your accident)  - Chest Pain  - Difficulty breathing  - Severe nausea or vomiting  - Inability to tolerate food and liquids  - Passing out  - Skin becoming red around your wounds  - Change in mental status (confusion or lethargy)  - New numbness or weakness    Please return if you have any other emergent concerns.  Additional Information:  Your vital signs today were: BP 132/86 (BP Location: Right Arm)    Pulse 86    Temp 97.8 F (36.6 C) (Oral)    Resp 20    SpO2 96%  If your blood pressure (BP) was elevated above 135/85 this visit, please have this repeated by your doctor within one month. ---------------

## 2016-07-15 ENCOUNTER — Emergency Department (HOSPITAL_COMMUNITY): Payer: Non-veteran care

## 2016-07-15 ENCOUNTER — Encounter (HOSPITAL_COMMUNITY): Payer: Self-pay | Admitting: Emergency Medicine

## 2016-07-15 ENCOUNTER — Emergency Department (HOSPITAL_COMMUNITY)
Admission: EM | Admit: 2016-07-15 | Discharge: 2016-07-15 | Disposition: A | Discharge status: Home / Self Care | Payer: Non-veteran care | Attending: Emergency Medicine | Admitting: Emergency Medicine

## 2016-07-15 DIAGNOSIS — J449 Chronic obstructive pulmonary disease, unspecified: Secondary | ICD-10-CM | POA: Diagnosis not present

## 2016-07-15 DIAGNOSIS — I10 Essential (primary) hypertension: Secondary | ICD-10-CM | POA: Diagnosis not present

## 2016-07-15 DIAGNOSIS — Z7982 Long term (current) use of aspirin: Secondary | ICD-10-CM | POA: Insufficient documentation

## 2016-07-15 DIAGNOSIS — F17210 Nicotine dependence, cigarettes, uncomplicated: Secondary | ICD-10-CM | POA: Insufficient documentation

## 2016-07-15 DIAGNOSIS — Z79899 Other long term (current) drug therapy: Secondary | ICD-10-CM | POA: Insufficient documentation

## 2016-07-15 DIAGNOSIS — G8929 Other chronic pain: Secondary | ICD-10-CM

## 2016-07-15 DIAGNOSIS — M25512 Pain in left shoulder: Secondary | ICD-10-CM | POA: Insufficient documentation

## 2016-07-15 LAB — CBC
HCT: 44.5 % (ref 39.0–52.0)
HEMOGLOBIN: 15.1 g/dL (ref 13.0–17.0)
MCH: 30.1 pg (ref 26.0–34.0)
MCHC: 33.9 g/dL (ref 30.0–36.0)
MCV: 88.6 fL (ref 78.0–100.0)
Platelets: 389 10*3/uL (ref 150–400)
RBC: 5.02 MIL/uL (ref 4.22–5.81)
RDW: 13.7 % (ref 11.5–15.5)
WBC: 13 10*3/uL — ABNORMAL HIGH (ref 4.0–10.5)

## 2016-07-15 LAB — BASIC METABOLIC PANEL
ANION GAP: 10 (ref 5–15)
BUN: 15 mg/dL (ref 6–20)
CHLORIDE: 103 mmol/L (ref 101–111)
CO2: 21 mmol/L — AB (ref 22–32)
Calcium: 9.1 mg/dL (ref 8.9–10.3)
Creatinine, Ser: 1.04 mg/dL (ref 0.61–1.24)
GFR calc non Af Amer: >60 mL/min (ref >60)
Glucose, Bld: 123 mg/dL — ABNORMAL HIGH (ref 65–99)
POTASSIUM: 4.4 mmol/L (ref 3.5–5.1)
Sodium: 134 mmol/L — ABNORMAL LOW (ref 135–145)

## 2016-07-15 LAB — I-STAT TROPONIN, ED: TROPONIN I, POC: 0 ng/mL (ref 0.00–0.08)

## 2016-07-15 MED ORDER — HYDROCODONE-ACETAMINOPHEN 5-325 MG PO TABS
2.0000 | ORAL_TABLET | Freq: Once | ORAL | Status: AC
  Administered 2016-07-15: 2 via ORAL
  Filled 2016-07-15: qty 2

## 2016-07-15 NOTE — ED Provider Notes (Signed)
[facility] Provider Note   CSN: [id number] Arrival date & time: 07/15/16  1815     History   Chief Complaint Chief Complaint  Patient presents with  . Chest Pain    HPI Dennis Dominguez is a 61 y.o. male.  Patient presents to the emergency department with chief complaint of left shoulder pain. He states that he has had the pain for the past several months. He states that today he had some shooting pain from his left shoulder to his left upper chest. He denies any associated shortness breath. There are no modifying factors. He denies any fevers, chills, or cough. Denies any known heart problems. There are no other associated symptoms.   The history is provided by the patient. No language interpreter was used.    Past Medical History:  Diagnosis Date  . Asthma   . COPD (chronic obstructive pulmonary disease) (HCC)   . High cholesterol   . Hypertension   . Kidney stones   . Renal disorder     There are no active problems to display for this patient.   Past Surgical History:  Procedure Laterality Date  . lithrotripsy         Home Medications    Prior to Admission medications   Medication Sig Start Date End Date Taking? Authorizing Provider  albuterol (PROVENTIL HFA;VENTOLIN HFA) 108 (90 BASE) MCG/ACT inhaler Inhale 2 puffs into the lungs every 6 (six) hours as needed for wheezing or shortness of breath.     Historical Provider, MD  aspirin EC 81 MG tablet Take 162 mg by mouth daily with breakfast.     Historical Provider, MD  DM-Doxylamine-Acetaminophen 15-6.25-325 MG/15ML LIQD Take 2 capsules by mouth daily as needed (for cold/sleep).    Historical Provider, MD  doxycycline (VIBRAMYCIN) 100 MG capsule Take 1 capsule (100 mg total) by mouth 2 (two) times daily. 05/13/16   Shawn C Joy, PA-C  fish oil-omega-3 fatty acids 1000 MG capsule Take 1 g by mouth daily with breakfast.     Historical Provider, MD  HYDROcodone-acetaminophen (NORCO/VICODIN) 5-325 MG per  tablet Take 2 tablets by mouth every 4 (four) hours as needed. Patient not taking: Reported on 05/28/2014 07/09/13   Irish Elders, FNP  ibuprofen (ADVIL,MOTRIN) 200 MG tablet Take 800 mg by mouth every 6 (six) hours as needed for moderate pain.     Historical Provider, MD  lisinopril (PRINIVIL,ZESTRIL) 20 MG tablet Take 20 mg by mouth every morning.     Historical Provider, MD  naproxen (NAPROSYN) 500 MG tablet Take 1 tablet (500 mg total) by mouth 2 (two) times daily. 06/15/16   Audry Pili, PA-C  predniSONE (STERAPRED UNI-PAK 21 TAB) 10 MG (21) TBPK tablet Take 6 tabs day 1, 5 tabs day 2, 4 tabs day 3, 3 tabs day 4, 2 tabs day 5, and 1 tab on day 6. 05/13/16   Shawn C Joy, PA-C  ranitidine (ZANTAC) 150 MG tablet Take 150 mg by mouth 2 (two) times daily.    Historical Provider, MD  simvastatin (ZOCOR) 80 MG tablet Take 80 mg by mouth at bedtime.    Historical Provider, MD    Family History Family History  Problem Relation Age of Onset  . Hypertension Mother   . Diabetes Mother     Social History Social History  Substance Use Topics  . Smoking status: Current Every Day Smoker    Packs/day: 2.00    Types: Cigarettes  . Smokeless tobacco: Never Used  .  Alcohol use No     Allergies   Penicillins and Bactrim [sulfamethoxazole-trimethoprim]   Review of Systems Review of Systems  All other systems reviewed and are negative.    Physical Exam Updated Vital Signs BP 128/85   Pulse 68   Temp 98.1 F (36.7 C) (Oral)   Resp 17   SpO2 96%   Physical Exam  Constitutional: He is oriented to person, place, and time. He appears well-developed and well-nourished.  HENT:  Head: Normocephalic and atraumatic.  Eyes: Conjunctivae and EOM are normal. Pupils are equal, round, and reactive to light. Right eye exhibits no discharge. Left eye exhibits no discharge. No scleral icterus.  Neck: Normal range of motion. Neck supple. No JVD present.  Cardiovascular: Normal rate, regular rhythm and normal  heart sounds.  Exam reveals no gallop and no friction rub.   No murmur heard. Pulmonary/Chest: Effort normal and breath sounds normal. No respiratory distress. He has no wheezes. He has no rales. He exhibits no tenderness.  Abdominal: Soft. He exhibits no distension and no mass. There is no tenderness. There is no rebound and no guarding.  Musculoskeletal: Normal range of motion. He exhibits no edema or tenderness.  Left shoulder mildly tender to palpation, no bony abnormality or deformity, range of motion strength 5/5  Neurological: He is alert and oriented to person, place, and time.  Skin: Skin is warm and dry.  Psychiatric: He has a normal mood and affect. His behavior is normal. Judgment and thought content normal.  Nursing note and vitals reviewed.    ED Treatments / Results  Labs (all labs ordered are listed, but only abnormal results are displayed) Labs Reviewed  BASIC METABOLIC PANEL - Abnormal; Notable for the following:       Result Value   Sodium 134 (*)    CO2 21 (*)    Glucose, Bld 123 (*)    All other components within normal limits  CBC - Abnormal; Notable for the following:    WBC 13.0 (*)    All other components within normal limits  I-STAT TROPOININ, ED    EKG  EKG Interpretation  Date/Time:  Saturday July 15 2016 18:23:45 EST Ventricular Rate:  94 PR Interval:  136 QRS Duration: 94 QT Interval:  360 QTC Calculation: 450 R Axis:   54 Text Interpretation:  Normal sinus rhythm Normal ECG No significant change since last tracing Confirmed by LITTLE MD, RACHEL [phone]([id number]) on 07/15/2016 10:22:03 PM       Radiology Dg Chest 2 View  Result Date: 07/15/2016 CLINICAL DATA:  Left shoulder pain radiates into the chest. EXAM: CHEST  2 VIEW COMPARISON:  05/28/2014 FINDINGS: The lungs are clear wiithout focal pneumonia, edema, pneumothorax or pleural effusion. Interstitial markings are diffusely coarsened with chronic features. The cardiopericardial silhouette is  within normal limits for size. The visualized bony structures of the thorax are intact. IMPRESSION: Chronic interstitial coarsening.  No acute cardiopulmonary process. Electronically Signed   By: Kennith CenterEric  Mansell M.D.   On: 07/15/2016 19:19    Procedures Procedures (including critical care time)  Medications Ordered in ED Medications  HYDROcodone-acetaminophen (NORCO/VICODIN) 5-325 MG per tablet 2 tablet (2 tablets Oral Given 07/15/16 2147)     Initial Impression / Assessment and Plan / ED Course  I have reviewed the triage vital signs and the nursing notes.  Pertinent labs & imaging results that were available during my care of the patient were reviewed by me and considered in my medical decision making (see  chart for details).     Patient with left shoulder pain that radiates into his left chest. I have low suspicion for ACS in this patient. His laboratory workup, chest x-ray, and EKG are reassuring. I will give the patient some Vicodin in the ED for his shoulder pain and recommend orthopedic follow-up. She understands agrees the plan. He is stable and ready for discharge.  Final Clinical Impressions(s) / ED Diagnoses   Final diagnoses:  Chronic left shoulder pain    New Prescriptions New Prescriptions   No medications on file     Roxy Horseman, PA-C 07/15/16 2238    Laurence Spates, MD 07/16/16 [phone]

## 2016-07-15 NOTE — ED Triage Notes (Signed)
Pt states he has been having arm pain for several months. Today the pain started shooting across his chest. Denies N. Pt states he has some "hot flashes".

## 2016-10-12 ENCOUNTER — Emergency Department (HOSPITAL_COMMUNITY): Payer: Non-veteran care

## 2016-10-12 ENCOUNTER — Encounter (HOSPITAL_COMMUNITY): Payer: Self-pay | Admitting: Emergency Medicine

## 2016-10-12 ENCOUNTER — Emergency Department (HOSPITAL_COMMUNITY)
Admission: EM | Admit: 2016-10-12 | Discharge: 2016-10-12 | Disposition: A | Discharge status: Home / Self Care | Payer: Non-veteran care | Attending: Emergency Medicine | Admitting: Emergency Medicine

## 2016-10-12 DIAGNOSIS — F17210 Nicotine dependence, cigarettes, uncomplicated: Secondary | ICD-10-CM | POA: Insufficient documentation

## 2016-10-12 DIAGNOSIS — J441 Chronic obstructive pulmonary disease with (acute) exacerbation: Secondary | ICD-10-CM | POA: Insufficient documentation

## 2016-10-12 DIAGNOSIS — R05 Cough: Secondary | ICD-10-CM

## 2016-10-12 DIAGNOSIS — R059 Cough, unspecified: Secondary | ICD-10-CM

## 2016-10-12 DIAGNOSIS — Z79899 Other long term (current) drug therapy: Secondary | ICD-10-CM | POA: Insufficient documentation

## 2016-10-12 DIAGNOSIS — J45909 Unspecified asthma, uncomplicated: Secondary | ICD-10-CM | POA: Insufficient documentation

## 2016-10-12 DIAGNOSIS — I10 Essential (primary) hypertension: Secondary | ICD-10-CM | POA: Insufficient documentation

## 2016-10-12 DIAGNOSIS — Z7982 Long term (current) use of aspirin: Secondary | ICD-10-CM | POA: Insufficient documentation

## 2016-10-12 MED ORDER — IPRATROPIUM-ALBUTEROL 0.5-2.5 (3) MG/3ML IN SOLN
3.0000 mL | Freq: Once | RESPIRATORY_TRACT | Status: AC
  Administered 2016-10-12: 3 mL via RESPIRATORY_TRACT
  Filled 2016-10-12: qty 3

## 2016-10-12 MED ORDER — AZITHROMYCIN 250 MG PO TABS: 250.0000 mg | ORAL_TABLET | Freq: Every day | ORAL | 0 refills | Status: DC

## 2016-10-12 MED ORDER — PREDNISONE 50 MG PO TABS: 50.0000 mg | ORAL_TABLET | Freq: Every day | ORAL | 0 refills | Status: AC

## 2016-10-12 NOTE — ED Triage Notes (Signed)
Patient here from home with complaints of cough that started Sunday. Reports taking OTC cough medication with no relief. States that [facility] but unable to go today.

## 2016-10-12 NOTE — ED Provider Notes (Signed)
[facility]-EMERGENCY DEPT Provider Note   CSN: [id number] Arrival date & time: 10/12/16  8295     History   Chief Complaint Chief Complaint  Patient presents with  . Cough    HPI Dennis Dominguez is a 61 y.o. male with history of COPD, hypertension who presents with a cough, chest tightness, shortness of breath that has been present for 5 days. Patient reports a new brown sputum associated with this cough. He has been taking over-the-counter TheraFlu without significant relief. He has been using his albuterol inhaler at home without significant relief. His shortness of breath is worse on exertion. He denies any chest pain. He has had some mild associated throat irritation. He has had some chills, but no measured fever. He has also had a throbbing headache behind his eyes associated with the symptoms. He denies any abdominal pain, nausea, vomiting.  HPI  Past Medical History:  Diagnosis Date  . Asthma   . COPD (chronic obstructive pulmonary disease) (HCC)   . High cholesterol   . Hypertension   . Kidney stones   . Renal disorder     There are no active problems to display for this patient.   Past Surgical History:  Procedure Laterality Date  . lithrotripsy         Home Medications    Prior to Admission medications   Medication Sig Start Date End Date Taking? Authorizing Provider  albuterol (PROVENTIL HFA;VENTOLIN HFA) 108 (90 BASE) MCG/ACT inhaler Inhale 2 puffs into the lungs every 6 (six) hours as needed for wheezing or shortness of breath.    Yes [provider]  aspirin EC 81 MG tablet Take 162 mg by mouth daily with breakfast.    Yes [provider]  fish oil-omega-3 fatty acids 1000 MG capsule Take 1 g by mouth daily with breakfast.    Yes [provider]  ibuprofen (ADVIL,MOTRIN) 200 MG tablet Take 800 mg by mouth every 6 (six) hours as needed for moderate pain.    Yes [provider]  lisinopril (PRINIVIL,ZESTRIL) 20 MG tablet Take  20 mg by mouth every morning.    Yes [provider]  Phenylephrine-Pheniramine-DM [facility] COLD & COUGH PO) Take 1 capsule by mouth daily as needed (cold/flu symptoms).   Yes [provider]  ranitidine (ZANTAC) 150 MG tablet Take 150 mg by mouth 2 (two) times daily.   Yes [provider]  simvastatin (ZOCOR) 80 MG tablet Take 80 mg by mouth at bedtime.   Yes [provider]  azithromycin (ZITHROMAX) 250 MG tablet Take 1 tablet (250 mg total) by mouth daily. Take first 2 tablets together, then 1 every day until finished. 10/12/16   Nesbit Michon, Waylan Boga, PA-C  doxycycline (VIBRAMYCIN) 100 MG capsule Take 1 capsule (100 mg total) by mouth 2 (two) times daily. Patient not taking: Reported on 10/12/2016 05/13/16   Anselm Pancoast, PA-C  HYDROcodone-acetaminophen (NORCO/VICODIN) 5-325 MG per tablet Take 2 tablets by mouth every 4 (four) hours as needed. Patient not taking: Reported on 10/12/2016 07/09/13   Irish Elders, FNP  naproxen (NAPROSYN) 500 MG tablet Take 1 tablet (500 mg total) by mouth 2 (two) times daily. Patient not taking: Reported on 10/12/2016 06/15/16   Audry Pili, PA-C  predniSONE (DELTASONE) 50 MG tablet Take 1 tablet (50 mg total) by mouth daily. 10/12/16 10/17/16  Emi Holes, PA-C    Family History Family History  Problem Relation Age of Onset  . Hypertension Mother   . Diabetes  Mother     Social History Social History  Substance Use Topics  . Smoking status: Current Every Day Smoker    Packs/day: 2.00    Types: Cigarettes  . Smokeless tobacco: Never Used  . Alcohol use No     Allergies   Penicillins and Bactrim [sulfamethoxazole-trimethoprim]   Review of Systems Review of Systems  Constitutional: Positive for appetite change (decreased) and chills. Negative for fever.  HENT: Positive for sore throat. Negative for facial swelling.   Respiratory: Positive for cough, chest tightness and shortness of breath.   Cardiovascular: Negative for  chest pain.  Gastrointestinal: Negative for abdominal pain, nausea and vomiting.  Genitourinary: Negative for dysuria.  Musculoskeletal: Negative for back pain.  Skin: Negative for rash and wound.  Neurological: Negative for headaches.  Psychiatric/Behavioral: The patient is not nervous/anxious.      Physical Exam Updated Vital Signs BP (!) 136/98 (BP Location: Right Arm)   Pulse 82   Temp 98 F (36.7 C) (Oral)   Resp 20   Ht 5\' 9"  (1.753 m)   Wt 110.7 kg (244 lb)   SpO2 97%   BMI 36.03 kg/m   Physical Exam  Constitutional: He appears well-developed and well-nourished. No distress.  HENT:  Head: Normocephalic and atraumatic.  Left Ear: Tympanic membrane normal.  Mouth/Throat: No trismus in the jaw. Posterior oropharyngeal erythema (mild) present. No oropharyngeal exudate, posterior oropharyngeal edema or tonsillar abscesses.  Cerumen obstructing R TM  Eyes: Conjunctivae are normal. Pupils are equal, round, and reactive to light. Right eye exhibits no discharge. Left eye exhibits no discharge. No scleral icterus.  Neck: Normal range of motion. Neck supple. No thyromegaly present.  Cardiovascular: Normal rate, regular rhythm, normal heart sounds and intact distal pulses.  Exam reveals no gallop and no friction rub.   No murmur heard. Pulmonary/Chest: Effort normal. No stridor. No respiratory distress. He has wheezes (bilateral inspiratory and expiratory). He has rhonchi. He has no rales.  Abdominal: Soft. Bowel sounds are normal. He exhibits no distension. There is no tenderness. There is no rebound and no guarding.  Musculoskeletal: He exhibits no edema.  Lymphadenopathy:    He has no cervical adenopathy.  Neurological: He is alert. Coordination normal.  Skin: Skin is warm and dry. No rash noted. He is not diaphoretic. No pallor.  Psychiatric: He has a normal mood and affect.  Nursing note and vitals reviewed.    ED Treatments / Results  Labs (all labs ordered are  listed, but only abnormal results are displayed) Labs Reviewed - No data to display  EKG  EKG Interpretation None       Radiology Dg Chest 2 View  Result Date: 10/12/2016 CLINICAL DATA:  Cough and congestion EXAM: CHEST  2 VIEW COMPARISON:  07/15/2016 FINDINGS: Cardiac shadow is within normal limits. Mild interstitial changes are noted. No focal infiltrate or sizable effusion is seen. No bony abnormality is noted. IMPRESSION: No active cardiopulmonary disease. Electronically Signed   By: Alcide Clever M.D.   On: 10/12/2016 08:08    Procedures Procedures (including critical care time)  Medications Ordered in ED Medications  ipratropium-albuterol (DUONEB) 0.5-2.5 (3) MG/3ML nebulizer solution 3 mL (3 mLs Nebulization Given 10/12/16 0759)  ipratropium-albuterol (DUONEB) 0.5-2.5 (3) MG/3ML nebulizer solution 3 mL (3 mLs Nebulization Given 10/12/16 0912)     Initial Impression / Assessment and Plan / ED Course  I have reviewed the triage vital signs and the nursing notes.  Pertinent labs & imaging results that were available  during my care of the patient were reviewed by me and considered in my medical decision making (see chart for details).     Patient given DuoNeb nebulizer treatment in the ED. Lung sounds not much improved, but patient states he does feel a little better. Will order repeat DuoNeb and reassess.   On reassessment after the second DuoNeb treatment, patient reports he is breathing back to baseline. Lung exam much improved. Wheezes no longer present. Lungs are clear.  Patient with probable COPD exacerbation. CXR shows no active cardiopulmonary disease. Will treat with azithromycin and 5 day prednisone burst, considering sputum change. [facility] return precautions discussed. Patient understands and agrees with plan. Patient vitals stable throughout ED course and discharged in satisfactory condition.  Final Clinical Impressions(s) / ED  Diagnoses   Final diagnoses:  Cough  COPD exacerbation (HCC)    New Prescriptions New Prescriptions   AZITHROMYCIN (ZITHROMAX) 250 MG TABLET    Take 1 tablet (250 mg total) by mouth daily. Take first 2 tablets together, then 1 every day until finished.   PREDNISONE (DELTASONE) 50 MG TABLET    Take 1 tablet (50 mg total) by mouth daily.     Emi HolesLaw, Taeler Winning M, PA-C 10/12/16 [id number]    Doug SouJacubowitz, Sam, MD 10/12/16 1537

## 2016-10-12 NOTE — Discharge Instructions (Signed)
Medications: Azithromycin, prednisone  Treatment: Take azithromycin as prescribed for 5 days. Take prednisone as prescribed for 5 days. Use your albuterol inhaler every 4-6 hours as needed for shortness of breath, wheezing, or chest tightness. You can continue taking you over-the-counter cough medication as prescribed.  Follow-up: Please follow-up with your primary care provider for further evaluation and treatment of your COPD. Please return to emergency department if you develop any new or worsening symptoms.

## 2016-10-25 ENCOUNTER — Encounter (HOSPITAL_COMMUNITY): Payer: Self-pay

## 2016-10-25 ENCOUNTER — Emergency Department (HOSPITAL_COMMUNITY)
Admission: EM | Admit: 2016-10-25 | Discharge: 2016-10-25 | Disposition: A | Discharge status: Home / Self Care | Payer: Non-veteran care | Attending: Emergency Medicine | Admitting: Emergency Medicine

## 2016-10-25 ENCOUNTER — Emergency Department (HOSPITAL_COMMUNITY): Payer: Non-veteran care

## 2016-10-25 DIAGNOSIS — Z79899 Other long term (current) drug therapy: Secondary | ICD-10-CM | POA: Insufficient documentation

## 2016-10-25 DIAGNOSIS — Z7982 Long term (current) use of aspirin: Secondary | ICD-10-CM | POA: Insufficient documentation

## 2016-10-25 DIAGNOSIS — F17210 Nicotine dependence, cigarettes, uncomplicated: Secondary | ICD-10-CM | POA: Diagnosis not present

## 2016-10-25 DIAGNOSIS — I10 Essential (primary) hypertension: Secondary | ICD-10-CM | POA: Diagnosis not present

## 2016-10-25 DIAGNOSIS — J441 Chronic obstructive pulmonary disease with (acute) exacerbation: Secondary | ICD-10-CM | POA: Insufficient documentation

## 2016-10-25 DIAGNOSIS — R0789 Other chest pain: Secondary | ICD-10-CM | POA: Diagnosis present

## 2016-10-25 LAB — BASIC METABOLIC PANEL
Anion gap: 9 (ref 5–15)
BUN: 15 mg/dL (ref 6–20)
CHLORIDE: 101 mmol/L (ref 101–111)
CO2: 23 mmol/L (ref 22–32)
CREATININE: 1.32 mg/dL — AB (ref 0.61–1.24)
Calcium: 9.2 mg/dL (ref 8.9–10.3)
GFR calc Af Amer: >60 mL/min (ref >60)
GFR calc non Af Amer: 56 mL/min — ABNORMAL LOW (ref >60)
Glucose, Bld: 103 mg/dL — ABNORMAL HIGH (ref 65–99)
Potassium: 3.9 mmol/L (ref 3.5–5.1)
Sodium: 133 mmol/L — ABNORMAL LOW (ref 135–145)

## 2016-10-25 LAB — CBC
HEMATOCRIT: 43.2 % (ref 39.0–52.0)
HEMOGLOBIN: 15.1 g/dL (ref 13.0–17.0)
MCH: 30.6 pg (ref 26.0–34.0)
MCHC: 35 g/dL (ref 30.0–36.0)
MCV: 87.4 fL (ref 78.0–100.0)
Platelets: 369 10*3/uL (ref 150–400)
RBC: 4.94 MIL/uL (ref 4.22–5.81)
RDW: 13.8 % (ref 11.5–15.5)
WBC: 14.4 10*3/uL — ABNORMAL HIGH (ref 4.0–10.5)

## 2016-10-25 LAB — POCT I-STAT TROPONIN I: TROPONIN I, POC: 0 ng/mL (ref 0.00–0.08)

## 2016-10-25 MED ORDER — FLUTICASONE PROPIONATE 50 MCG/ACT NA SUSP: 1.0000 | Freq: Every day | NASAL | 0 refills | Status: DC

## 2016-10-25 MED ORDER — LEVOFLOXACIN 500 MG PO TABS: 500.0000 mg | ORAL_TABLET | Freq: Every day | ORAL | 0 refills | Status: DC

## 2016-10-25 MED ORDER — METHYLPREDNISOLONE SODIUM SUCC 125 MG IJ SOLR
125.0000 mg | Freq: Once | INTRAMUSCULAR | Status: AC
  Administered 2016-10-25: 125 mg via INTRAVENOUS
  Filled 2016-10-25: qty 2

## 2016-10-25 MED ORDER — IPRATROPIUM-ALBUTEROL 0.5-2.5 (3) MG/3ML IN SOLN
3.0000 mL | Freq: Once | RESPIRATORY_TRACT | Status: AC
  Administered 2016-10-25: 3 mL via RESPIRATORY_TRACT
  Filled 2016-10-25: qty 3

## 2016-10-25 MED ORDER — SODIUM CHLORIDE 0.9 % IV BOLUS (SEPSIS)
500.0000 mL | Freq: Once | INTRAVENOUS | Status: AC
  Administered 2016-10-25: 500 mL via INTRAVENOUS

## 2016-10-25 MED ORDER — SODIUM CHLORIDE 0.9 % IV SOLN
1000.0000 mL | INTRAVENOUS | Status: DC
Start: 2016-10-25 — End: 2016-10-25
  Administered 2016-10-25: 1000 mL via INTRAVENOUS

## 2016-10-25 NOTE — ED Notes (Signed)
Patient ambulated around department maintaining O2 between 97%-91%

## 2016-10-25 NOTE — ED Triage Notes (Signed)
He states he was seen here recently for uri/chest pain and was given rx for Z pack and prednisone. He states he took the Z pack, but not the prednisone. He states he "got better, but then it came back". He also states the chest pain "got worse while I was lifting things helping a friend". EKG performed at triage.

## 2016-10-25 NOTE — ED Notes (Signed)
ED Provider at bedside. 

## 2016-10-25 NOTE — ED Notes (Signed)
Stuck patient 2 times fro IV access, both unsuccessful, but was able to draw blood for specimens.

## 2016-10-25 NOTE — ED Notes (Signed)
Patient returned from xray.   Made Respiratory aware of neb treatment ordered.

## 2016-10-25 NOTE — ED Provider Notes (Signed)
[facility]-EMERGENCY DEPT Provider Note   CSN: [id number] Arrival date & time: 10/25/16  1228     History   Chief Complaint Chief Complaint  Patient presents with  . Chest Pain  . Cough    HPI Dennis Dominguez is a 61 y.o. male presenting with cough and congestion 3 weeks.  Patient states that he was seen in the ED 2 weeks ago for the same. He was diagnosed with COPD exacerbation, and sent home with Z-Pak. He felt better by day 4 the Z-Pak, but after he finished taking the antibiotic, his symptoms returned. He reports he has some nasal congestion, postnasal drip, and feels like he has chest congestion. He describes chest tightness, but denies chest pain or shortness of breath. He reports his cough is nonproductive, and makes his chest tightness worse. He denies fever, chills, rash, headache, neck stiffness, nausea, vomiting, abdominal pain, urinary symptoms. He has an inhaler at home that he is using 5 or 6 times a day only minimal relief. He has been using Mucinex and other antihistamines for symptomatically treatment without relief.  Additionally, patient reports he was moving cinder blocks outside yesterday, when he experienced some right-sided sharp chest pain. This went away immediately. This morning he had a similar pain as he was outside doing activities. He denies nausea, shortness of breath, or diaphoresis associated with this chest pain. It does not radiate anywhere and lasts for a few seconds each time. Occasionally he feels this chest pain as he coughs.  HPI  Past Medical History:  Diagnosis Date  . Asthma   . COPD (chronic obstructive pulmonary disease) (HCC)   . High cholesterol   . Hypertension   . Kidney stones   . Renal disorder     There are no active problems to display for this patient.   Past Surgical History:  Procedure Laterality Date  . lithrotripsy         Home Medications    Prior to Admission medications   Medication Sig Start Date End Date  Taking? Authorizing Provider  albuterol (PROVENTIL HFA;VENTOLIN HFA) 108 (90 BASE) MCG/ACT inhaler Inhale 2 puffs into the lungs every 6 (six) hours as needed for wheezing or shortness of breath.    Yes [provider]  aspirin EC 81 MG tablet Take 162 mg by mouth daily with breakfast.    Yes [provider]  fish oil-omega-3 fatty acids 1000 MG capsule Take 1 g by mouth daily with breakfast.    Yes [provider]  ibuprofen (ADVIL,MOTRIN) 200 MG tablet Take 800 mg by mouth every 6 (six) hours as needed for moderate pain.    Yes [provider]  lisinopril (PRINIVIL,ZESTRIL) 20 MG tablet Take 20 mg by mouth every morning.    Yes [provider]  Phenylephrine-Pheniramine-DM [facility](THERAFLU COLD & COUGH PO) Take 1 capsule by mouth daily as needed (cold/flu symptoms).   Yes [provider]  ranitidine (ZANTAC) 150 MG tablet Take 150 mg by mouth 2 (two) times daily.   Yes [provider]  simvastatin (ZOCOR) 80 MG tablet Take 80 mg by mouth at bedtime.   Yes [provider]  azithromycin (ZITHROMAX) 250 MG tablet Take 1 tablet (250 mg total) by mouth daily. Take first 2 tablets together, then 1 every day until finished. Patient not taking: Reported on 10/25/2016 10/12/16   Emi HolesLaw, Alexandra M, PA-C  doxycycline (VIBRAMYCIN) 100 MG capsule Take 1 capsule (100 mg total) by mouth 2 (two) times daily. Patient  not taking: Reported on 10/12/2016 05/13/16   Joy, Ines Bloomer C, PA-C  fluticasone (FLONASE) 50 MCG/ACT nasal spray Place 1 spray into both nostrils daily. 10/25/16 11/04/16  Shalayne Leach, PA-C  HYDROcodone-acetaminophen (NORCO/VICODIN) 5-325 MG per tablet Take 2 tablets by mouth every 4 (four) hours as needed. Patient not taking: Reported on 10/12/2016 07/09/13   Irish Elders, FNP  levofloxacin (LEVAQUIN) 500 MG tablet Take 1 tablet (500 mg total) by mouth daily. 10/25/16   Mattie Nordell, PA-C  naproxen (NAPROSYN) 500 MG tablet Take 1 tablet (500  mg total) by mouth 2 (two) times daily. Patient not taking: Reported on 10/12/2016 06/15/16   Audry Pili, PA-C    Family History Family History  Problem Relation Age of Onset  . Hypertension Mother   . Diabetes Mother     Social History Social History  Substance Use Topics  . Smoking status: Current Every Day Smoker    Packs/day: 2.00    Types: Cigarettes  . Smokeless tobacco: Never Used  . Alcohol use No     Allergies   Penicillins and Bactrim [sulfamethoxazole-trimethoprim]   Review of Systems Review of Systems  All other systems reviewed and are negative.    Physical Exam Updated Vital Signs BP (!) 139/109   Pulse 78   Temp 97.9 F (36.6 C) (Oral)   Resp 15   Ht 5\' 9"  (1.753 m)   Wt 110.7 kg (244 lb)   SpO2 95%   BMI 36.03 kg/m   Physical Exam  Constitutional: He is oriented to person, place, and time. He appears well-developed and well-nourished. No distress.  HENT:  Head: Normocephalic and atraumatic.  Nose: Mucosal edema present.  Mouth/Throat: Uvula is midline and oropharynx is clear and moist. Mucous membranes are dry.  Eyes: Conjunctivae, EOM and lids are normal. Pupils are equal, round, and reactive to light.  Neck: Normal range of motion. Neck supple.  Cardiovascular: Normal rate, regular rhythm, normal heart sounds and intact distal pulses.   No leg swelling, pain, or redness.  Pulmonary/Chest: Effort normal. No respiratory distress. He has wheezes. He exhibits no tenderness.  Abdominal: Soft. Bowel sounds are normal. He exhibits no distension. There is no tenderness.  Musculoskeletal: Normal range of motion.  Neurological: He is alert and oriented to person, place, and time.  Skin: Skin is warm and dry. No rash noted. He is not diaphoretic.  Nursing note and vitals reviewed.    ED Treatments / Results  Labs (all labs ordered are listed, but only abnormal results are displayed) Labs Reviewed  BASIC METABOLIC PANEL - Abnormal; Notable for  the following:       Result Value   Sodium 133 (*)    Glucose, Bld 103 (*)    Creatinine, Ser 1.32 (*)    GFR calc non Af Amer 56 (*)    All other components within normal limits  CBC - Abnormal; Notable for the following:    WBC 14.4 (*)    All other components within normal limits  I-STAT TROPOININ, ED  POCT I-STAT TROPONIN I    EKG  EKG Interpretation  Date/Time:  Wednesday October 25 2016 12:38:35 EDT Ventricular Rate:  83 PR Interval:    QRS Duration: 99 QT Interval:  363 QTC Calculation: 427 R Axis:   -20 Text Interpretation:  Sinus rhythm Borderline left axis deviation Anteroseptal infarct, age indeterminate No significant change since last tracing Confirmed by Melene Plan [phone]) on 10/25/2016 12:53:13 PM Also confirmed by Melene Plan [phone]), editor  Misty Stanley [phone])  on 10/25/2016 12:57:57 PM       Radiology Dg Chest 2 View  Result Date: 10/25/2016 CLINICAL DATA:  Recent diagnosis of upper respiratory tract infection. Chest pain. EXAM: CHEST  2 VIEW COMPARISON:  PA and lateral chest 10/12/2016, 07/15/2016 and 08/30/2010. FINDINGS: Peribronchial thickening is identified. No consolidative process, pneumothorax or effusion. Heart size is normal. No acute bony abnormality. IMPRESSION: Peribronchial thickening compatible with bronchitis. The exam is otherwise negative. Electronically Signed   By: Drusilla Kanner M.D.   On: 10/25/2016 13:25    Procedures Procedures (including critical care time)  Medications Ordered in ED Medications  sodium chloride 0.9 % bolus 500 mL (0 mLs Intravenous Stopped 10/25/16 1455)    Followed by  0.9 %  sodium chloride infusion (0 mLs Intravenous Stopped 10/25/16 1544)  ipratropium-albuterol (DUONEB) 0.5-2.5 (3) MG/3ML nebulizer solution 3 mL (3 mLs Nebulization Given 10/25/16 1332)  methylPREDNISolone sodium succinate (SOLU-MEDROL) 125 mg/2 mL injection 125 mg (125 mg Intravenous Given 10/25/16 1413)  ipratropium-albuterol (DUONEB)  0.5-2.5 (3) MG/3ML nebulizer solution 3 mL (3 mLs Nebulization Given 10/25/16 1432)     Initial Impression / Assessment and Plan / ED Course  I have reviewed the triage vital signs and the nursing notes.  Pertinent labs & imaging results that were available during my care of the patient were reviewed by me and considered in my medical decision making (see chart for details).  Clinical Course as of Oct 26 1602  Wed Oct 25, 2016  1330 Patient with a history of COPD, and recent COPD exacerbation in which his symptoms never fully resolved. Presenting with congestion, cough, and chest tightness. Wheezing heard on exam. Patient does not appear in any distress at this time. Will give DuoNeb, order chest x-ray, basic labs, troponin, and EKG.   [[place]]  1402 Labs, chest x-ray, and EKG reassuring. Low suspicion for cardiac event at this time, as patient's pain was associated with heavy lifting, and is producible with coughing. On reexamination, patient's lungs are much improved after initial DuoNeb treatment. He still has some scattered wheezing. Will administer second DuoNeb treatment, and check pulse ox with amputation. of note, patient's creatinine is slightly elevated at 1.3. Likely due to dehydration and taking antihistamines. Patient receiving IVF.  [[place]]  1558 Patient reports improved breathing on reassessment. He is able to ambulate with sats remaining 91 and above. Lung exam showed decreased wheezing. Will discharge patient with antibiotic and Flonase he can continue to use his at home medicines for symptomatic relief. Return precautions given. Patient to follow-up with primary care for further evaluation of his lungs and COPD. Patient states he understands and agrees to plan.  [[place]]    Clinical Course User Index [[place]] Unnamed Hino, PA-C     Final Clinical Impressions(s) / ED Diagnoses   Final diagnoses:  COPD with acute exacerbation (HCC)    New Prescriptions New Prescriptions    FLUTICASONE (FLONASE) 50 MCG/ACT NASAL SPRAY    Place 1 spray into both nostrils daily.   LEVOFLOXACIN (LEVAQUIN) 500 MG TABLET    Take 1 tablet (500 mg total) by mouth daily.     Alveria Apley, PA-C 10/25/16 1604    Melene Plan, DO 10/25/16 1605

## 2016-10-25 NOTE — Discharge Instructions (Signed)
Take Levaquin as prescribed. You may use Flonase as needed for nasal congestion. You may continue to use Mucinex and other antihistamines for symptomatically relief. Follow-up with your primary care in 1 week for further evaluation of your lung function and COPD. Return to the ED if you develops fever, chills, worsening cough, or any new or worsening symptoms.

## 2016-10-25 NOTE — ED Notes (Signed)
Made resp aware of neb treatment ordered. 

## 2016-12-20 ENCOUNTER — Emergency Department (HOSPITAL_COMMUNITY): Payer: Self-pay

## 2016-12-20 ENCOUNTER — Emergency Department (HOSPITAL_COMMUNITY)
Admission: EM | Admit: 2016-12-20 | Discharge: 2016-12-20 | Disposition: A | Discharge status: Home / Self Care | Payer: Self-pay | Attending: Emergency Medicine | Admitting: Emergency Medicine

## 2016-12-20 ENCOUNTER — Encounter (HOSPITAL_COMMUNITY): Payer: Self-pay | Admitting: Emergency Medicine

## 2016-12-20 DIAGNOSIS — Z7982 Long term (current) use of aspirin: Secondary | ICD-10-CM | POA: Insufficient documentation

## 2016-12-20 DIAGNOSIS — Z720 Tobacco use: Secondary | ICD-10-CM

## 2016-12-20 DIAGNOSIS — I10 Essential (primary) hypertension: Secondary | ICD-10-CM | POA: Insufficient documentation

## 2016-12-20 DIAGNOSIS — Z79899 Other long term (current) drug therapy: Secondary | ICD-10-CM | POA: Insufficient documentation

## 2016-12-20 DIAGNOSIS — J209 Acute bronchitis, unspecified: Secondary | ICD-10-CM | POA: Insufficient documentation

## 2016-12-20 DIAGNOSIS — J40 Bronchitis, not specified as acute or chronic: Secondary | ICD-10-CM

## 2016-12-20 DIAGNOSIS — J449 Chronic obstructive pulmonary disease, unspecified: Secondary | ICD-10-CM | POA: Insufficient documentation

## 2016-12-20 DIAGNOSIS — F17210 Nicotine dependence, cigarettes, uncomplicated: Secondary | ICD-10-CM | POA: Insufficient documentation

## 2016-12-20 LAB — CBC
HCT: 43.1 % (ref 39.0–52.0)
Hemoglobin: 14.7 g/dL (ref 13.0–17.0)
MCH: 30 pg (ref 26.0–34.0)
MCHC: 34.1 g/dL (ref 30.0–36.0)
MCV: 88 fL (ref 78.0–100.0)
PLATELETS: 335 10*3/uL (ref 150–400)
RBC: 4.9 MIL/uL (ref 4.22–5.81)
RDW: 14.1 % (ref 11.5–15.5)
WBC: 13.8 10*3/uL — AB (ref 4.0–10.5)

## 2016-12-20 LAB — POCT I-STAT TROPONIN I: TROPONIN I, POC: 0 ng/mL (ref 0.00–0.08)

## 2016-12-20 LAB — BASIC METABOLIC PANEL
ANION GAP: 9 (ref 5–15)
BUN: 12 mg/dL (ref 6–20)
CALCIUM: 9.2 mg/dL (ref 8.9–10.3)
CHLORIDE: 104 mmol/L (ref 101–111)
CO2: 24 mmol/L (ref 22–32)
CREATININE: 0.98 mg/dL (ref 0.61–1.24)
GFR calc non Af Amer: >60 mL/min (ref >60)
Glucose, Bld: 133 mg/dL — ABNORMAL HIGH (ref 65–99)
Potassium: 4.3 mmol/L (ref 3.5–5.1)
SODIUM: 137 mmol/L (ref 135–145)

## 2016-12-20 MED ORDER — IPRATROPIUM BROMIDE 0.02 % IN SOLN
RESPIRATORY_TRACT | Status: AC
  Administered 2016-12-20: 0.5 mg
  Filled 2016-12-20: qty 2.5

## 2016-12-20 MED ORDER — PREDNISONE 20 MG PO TABS: 20.0000 mg | ORAL_TABLET | Freq: Two times a day (BID) | ORAL | 0 refills | Status: DC

## 2016-12-20 MED ORDER — ALBUTEROL SULFATE (2.5 MG/3ML) 0.083% IN NEBU
5.0000 mg | INHALATION_SOLUTION | Freq: Once | RESPIRATORY_TRACT | Status: AC
  Administered 2016-12-20: 5 mg via RESPIRATORY_TRACT
  Filled 2016-12-20: qty 6

## 2016-12-20 MED ORDER — DOXYCYCLINE HYCLATE 100 MG PO CAPS: 100.0000 mg | ORAL_CAPSULE | Freq: Two times a day (BID) | ORAL | 0 refills | Status: DC

## 2016-12-20 NOTE — ED Notes (Signed)
Pt is alert and oriented x 4 and is verbally responsive. Pt lung sounds bilateral rales . Pt denies chest pain but feels a tightness in his chest.

## 2016-12-20 NOTE — ED Provider Notes (Signed)
[facility]-EMERGENCY DEPT Provider Note   CSN: [id number] Arrival date & time: 12/20/16  0715     History   Chief Complaint Chief Complaint  Patient presents with  . Chest Pain  . Cough    HPI Dennis Dominguez is a 61 y.o. male.  He patient presents for evaluation of ongoing cough, occasional sputum production, chest tightness, and general weakness feeling, for several days. He continues to smoke cigarettes. He has been using his albuterol inhaler, for "COPD". He denies diaphoresis, nausea, vomiting, paresthesia or focal weakness. There are no other known modifying factors.  HPI  Past Medical History:  Diagnosis Date  . Asthma   . COPD (chronic obstructive pulmonary disease) (HCC)   . High cholesterol   . Hypertension   . Kidney stones   . Renal disorder     There are no active problems to display for this patient.   Past Surgical History:  Procedure Laterality Date  . lithrotripsy         Home Medications    Prior to Admission medications   Medication Sig Start Date End Date Taking? Authorizing Provider  albuterol (PROVENTIL HFA;VENTOLIN HFA) 108 (90 BASE) MCG/ACT inhaler Inhale 2 puffs into the lungs every 6 (six) hours as needed for wheezing or shortness of breath.    Yes [provider]  aspirin EC 81 MG tablet Take 162 mg by mouth daily with breakfast.    Yes [provider]  fish oil-omega-3 fatty acids 1000 MG capsule Take 1 g by mouth daily with breakfast.    Yes [provider]  fluticasone (FLONASE) 50 MCG/ACT nasal spray Place 1 spray into both nostrils daily. 10/25/16 12/20/16 Yes Caccavale, Sophia, PA-C  ibuprofen (ADVIL,MOTRIN) 200 MG tablet Take 800 mg by mouth every 6 (six) hours as needed for moderate pain.    Yes [provider]  lisinopril (PRINIVIL,ZESTRIL) 20 MG tablet Take 20 mg by mouth every morning.    Yes [provider]  pseudoephedrine (SUDAFED) 120 MG 12 hr tablet Take 120 mg by mouth 2 (two)  times daily as needed for congestion.   Yes [provider]  ranitidine (ZANTAC) 150 MG tablet Take 150 mg by mouth 2 (two) times daily.   Yes [provider]  simvastatin (ZOCOR) 80 MG tablet Take 80 mg by mouth at bedtime.   Yes [provider]  doxycycline (VIBRAMYCIN) 100 MG capsule Take 1 capsule (100 mg total) by mouth 2 (two) times daily. 12/20/16   Mancel BaleWentz, Starla Deller, MD  predniSONE (DELTASONE) 20 MG tablet Take 1 tablet (20 mg total) by mouth 2 (two) times daily. 12/20/16   Mancel BaleWentz, Kaydense Rizo, MD    Family History Family History  Problem Relation Age of Onset  . Hypertension Mother   . Diabetes Mother     Social History Social History  Substance Use Topics  . Smoking status: Current Every Day Smoker    Packs/day: 2.00    Types: Cigarettes  . Smokeless tobacco: Never Used  . Alcohol use No     Allergies   Penicillins and Bactrim [sulfamethoxazole-trimethoprim]   Review of Systems Review of Systems  All other systems reviewed and are negative.    Physical Exam Updated Vital Signs BP 137/83 (BP Location: Right Arm)   Pulse 61   Temp 98.2 F (36.8 C) (Oral)   Resp 20   Ht 5\' 9"  (1.753 m)   Wt 112.5 kg (248 lb)   SpO2 99%   BMI 36.62 kg/m  Physical Exam  Constitutional: He is oriented to person, place, and time. He appears well-developed. No distress.  overweight  HENT:  Head: Normocephalic and atraumatic.  Right Ear: External ear normal.  Left Ear: External ear normal.  Eyes: Pupils are equal, round, and reactive to light. Conjunctivae and EOM are normal.  Neck: Normal range of motion and phonation normal. Neck supple.  Cardiovascular: Normal rate, regular rhythm and normal heart sounds.   Pulmonary/Chest: Effort normal. He exhibits no bony tenderness.  Somewhat decreased air movement bilaterally, without wheezing. Scattered rhonchi present. There is no increased work of breathing.  Abdominal: Soft. There is no tenderness.    Musculoskeletal: Normal range of motion.  Neurological: He is alert and oriented to person, place, and time. No cranial nerve deficit or sensory deficit. He exhibits normal muscle tone. Coordination normal.  Skin: Skin is warm, dry and intact.  Psychiatric: He has a normal mood and affect. His behavior is normal. Judgment and thought content normal.  Nursing note and vitals reviewed.    ED Treatments / Results  Labs (all labs ordered are listed, but only abnormal results are displayed) Labs Reviewed  BASIC METABOLIC PANEL - Abnormal; Notable for the following:       Result Value   Glucose, Bld 133 (*)    All other components within normal limits  CBC - Abnormal; Notable for the following:    WBC 13.8 (*)    All other components within normal limits  I-STAT TROPONIN, ED  POCT I-STAT TROPONIN I    EKG  EKG Interpretation  Date/Time:  Wednesday December 20 2016 07:22:43 EDT Ventricular Rate:  81 PR Interval:    QRS Duration: 99 QT Interval:  362 QTC Calculation: 421 R Axis:   -13 Text Interpretation:  Sinus rhythm Abnormal R-wave progression, early  transition Confirmed by Nicanor Alcon, April ([id number]) on 12/20/2016 7:33:57 AM Also confirmed by Nicanor Alcon, April ([id number]), editor Barbette Hair [phone])  on 12/20/2016 8:49:47 AM       Radiology Dg Chest 2 View  Result Date: 12/20/2016 CLINICAL DATA:  Increased cough, congestion, shortness of breath. Chest pain. EXAM: CHEST  2 VIEW COMPARISON:  10/25/2016. FINDINGS: Mediastinum hilar structures are normal. Stable borderline cardiomegaly. Very mild increase interstitial markings. A very mild component of interstitial edema and/or pneumonitis cannot be excluded . Low lung volumes with mild basilar atelectasis. No pleural effusion or pneumothorax . Thoracic spine scoliosis and degenerative change. IMPRESSION: 1. Borderline cardiomegaly. Very mild increase in interstitial markings. A very mild component of interstitial edema or pneumonitis cannot be  completely excluded. 2.  Low lung volumes with mild basilar atelectasis . Electronically Signed   By: Maisie Fus  Register   On: 12/20/2016 07:46    Procedures Procedures (including critical care time)  Medications Ordered in ED Medications  albuterol (PROVENTIL) (2.5 MG/3ML) 0.083% nebulizer solution 5 mg (5 mg Nebulization Given 12/20/16 0741)  ipratropium (ATROVENT) 0.02 % nebulizer solution (0.5 mg  Given 12/20/16 0741)     Initial Impression / Assessment and Plan / ED Course  I have reviewed the triage vital signs and the nursing notes.  Pertinent labs & imaging results that were available during my care of the patient were reviewed by me and considered in my medical decision making (see chart for details).  Clinical Course as of Dec 21 1427  Wed Dec 20, 2016  0940 EKG 12-Lead [EW]    Clinical Course User Index [EW] Mancel Bale, MD     Patient Vitals for the  past 24 hrs:  BP Temp Temp src Pulse Resp SpO2 Height Weight  12/20/16 1422 137/83 98.2 F (36.8 C) Oral 61 20 99 % - -  12/20/16 1243 137/83 - - 61 16 99 % - -  12/20/16 1130 - - - - - - 5\' 9"  (1.753 m) 112.5 kg (248 lb)  12/20/16 1020 118/85 - - 71 20 96 % - -  12/20/16 0741 - - - - - 96 % - -  12/20/16 0723 (!) 139/92 98.2 F (36.8 C) Oral 82 16 96 % - -    2:27 PM Reevaluation with update and discussion. After initial assessment and treatment, an updated evaluation reveals patient feels better, he is ambulating without difficulty.  He appears more comfortable at this time.  Findings discussed with the patient and all questions were answered. Lasalle Abee L     Final Clinical Impressions(s) / ED Diagnoses   Final diagnoses:  Bronchitis  Tobacco abuse    Evaluation is consistent with recurrent bronchitis associated with ongoing tobacco abuse.  Doubt pneumonia, sepsis, metabolic instability.  Nursing Notes Reviewed/ Care Coordinated Applicable Imaging Reviewed Interpretation of Laboratory Data  incorporated into ED treatment  The patient appears reasonably screened and/or stabilized for discharge and I doubt any other medical condition or other [facility] requiring further screening, evaluation, or treatment in the ED at this time prior to discharge.  Plan: Home Medications-continue current medications; Home Treatments-stop smoking; return here if the recommended treatment, does not improve the symptoms; Recommended follow up-PCP, as scheduled, and as needed.   New Prescriptions New Prescriptions   DOXYCYCLINE (VIBRAMYCIN) 100 MG CAPSULE    Take 1 capsule (100 mg total) by mouth 2 (two) times daily.   PREDNISONE (DELTASONE) 20 MG TABLET    Take 1 tablet (20 mg total) by mouth 2 (two) times daily.     Mancel Bale, MD 12/20/16 1430

## 2016-12-20 NOTE — Progress Notes (Signed)
CM noted 4 ED visits in the last 6 months.  Pt reports [facility] and has an appointment scheduled on Sept 30th at 10:00 am.  Pt is going to be referred to a pulmonary specialist for his COPD at that time.  No further CM needs noted at this time.

## 2016-12-20 NOTE — Discharge Instructions (Signed)
It is important to stop smoking as soon as possible.  Use your inhaler, albuterol, 2 puffs every 4 hours as needed for cough or trouble breathing.  Start the prescriptions given today, tomorrow.  Return here, if needed, for problems.

## 2016-12-20 NOTE — ED Triage Notes (Addendum)
Pt c/o chest tightness, headache, dizziness, SOB, and sinus congestion onset Sunday, states feels similar to past episodes of bronchitis. Hx COPD, productive cough at baseline, sputum has increased in amount and changed color from brown to white. No arm/neck pain or numbness. Used albuterol inhaler x 3 today.  Inspiratory and expiratory wheezing.

## 2017-10-17 ENCOUNTER — Encounter (HOSPITAL_COMMUNITY): Payer: Self-pay | Admitting: Emergency Medicine

## 2017-10-17 ENCOUNTER — Emergency Department (HOSPITAL_COMMUNITY)
Admission: EM | Admit: 2017-10-17 | Discharge: 2017-10-17 | Disposition: A | Discharge status: Home / Self Care | Payer: Non-veteran care | Attending: Emergency Medicine | Admitting: Emergency Medicine

## 2017-10-17 ENCOUNTER — Emergency Department (HOSPITAL_COMMUNITY): Payer: Non-veteran care

## 2017-10-17 DIAGNOSIS — Z7982 Long term (current) use of aspirin: Secondary | ICD-10-CM | POA: Insufficient documentation

## 2017-10-17 DIAGNOSIS — Z79899 Other long term (current) drug therapy: Secondary | ICD-10-CM | POA: Insufficient documentation

## 2017-10-17 DIAGNOSIS — I10 Essential (primary) hypertension: Secondary | ICD-10-CM | POA: Insufficient documentation

## 2017-10-17 DIAGNOSIS — F17210 Nicotine dependence, cigarettes, uncomplicated: Secondary | ICD-10-CM | POA: Insufficient documentation

## 2017-10-17 DIAGNOSIS — J441 Chronic obstructive pulmonary disease with (acute) exacerbation: Secondary | ICD-10-CM | POA: Insufficient documentation

## 2017-10-17 MED ORDER — ZOLPIDEM TARTRATE 5 MG PO TABS
5.0000 mg | ORAL_TABLET | Freq: Every evening | ORAL | Status: DC | PRN
  Administered 2017-10-17: 5 mg via ORAL
  Filled 2017-10-17: qty 1

## 2017-10-17 MED ORDER — ALBUTEROL SULFATE (2.5 MG/3ML) 0.083% IN NEBU
5.0000 mg | INHALATION_SOLUTION | Freq: Once | RESPIRATORY_TRACT | Status: AC
  Administered 2017-10-17: 5 mg via RESPIRATORY_TRACT
  Filled 2017-10-17: qty 6

## 2017-10-17 MED ORDER — ALBUTEROL (5 MG/ML) CONTINUOUS INHALATION SOLN
10.0000 mg/h | INHALATION_SOLUTION | Freq: Once | RESPIRATORY_TRACT | Status: AC
  Administered 2017-10-17: 10 mg/h via RESPIRATORY_TRACT
  Filled 2017-10-17: qty 20

## 2017-10-17 MED ORDER — PREDNISONE 20 MG PO TABS
60.0000 mg | ORAL_TABLET | Freq: Once | ORAL | Status: AC
  Administered 2017-10-17: 60 mg via ORAL
  Filled 2017-10-17: qty 3

## 2017-10-17 MED ORDER — PREDNISONE 20 MG PO TABS: 40.0000 mg | ORAL_TABLET | Freq: Every day | ORAL | 0 refills | Status: DC

## 2017-10-17 MED ORDER — DOXYCYCLINE HYCLATE 100 MG PO CAPS: 100.0000 mg | ORAL_CAPSULE | Freq: Two times a day (BID) | ORAL | 0 refills | Status: DC

## 2017-10-17 MED ORDER — IPRATROPIUM BROMIDE 0.02 % IN SOLN
0.5000 mg | Freq: Once | RESPIRATORY_TRACT | Status: AC
  Administered 2017-10-17: 0.5 mg via RESPIRATORY_TRACT
  Filled 2017-10-17: qty 2.5

## 2017-10-17 NOTE — ED Triage Notes (Signed)
Pt c/o SOB, congestion, cough with brown mucous, sweats/chill, stomach pain and diarrhea.  Pt has COPD and still smokes.

## 2017-10-17 NOTE — Discharge Instructions (Addendum)
We saw you in the ER for your breathing related complains. We gave you some breathing treatments in the ER, and seems like your symptoms have improved. °Please take albuterol as needed every 4 hours. °Please take the medications prescribed. °Please refrain from smoking or smoke exposure. °Please see a primary care doctor in 1 week. °Return to the ER if your symptoms worsen. ° °

## 2017-10-17 NOTE — ED Provider Notes (Addendum)
[facility]-EMERGENCY DEPT Provider Note   CSN: [id number] Arrival date & time: 10/17/17  0848     History   Chief Complaint Chief Complaint  Patient presents with  . Shortness of Breath  . Nasal Congestion  . Cough    HPI Dennis Dominguez is a 62 y.o. male.  HPI  62 year old with history of COPD comes in with chief complaint of shortness of breath and nasal congestion. Patient states that for the last 3 or 4 days he has been having worsening cough, URI-like symptoms and shortness of breath.  Patient's cough is producing clear phlegm.  Patient has associated chest tightness, which is worse with deep inspiration and cough.  Patient denies any associated fevers or chills. Patient continues to smoke 1 pack a day.  Past Medical History:  Diagnosis Date  . Asthma   . COPD (chronic obstructive pulmonary disease) (HCC)   . High cholesterol   . Hypertension   . Kidney stones   . Renal disorder     There are no active problems to display for this patient.   Past Surgical History:  Procedure Laterality Date  . lithrotripsy          Home Medications    Prior to Admission medications   Medication Sig Start Date End Date Taking? Authorizing Provider  albuterol (PROVENTIL HFA;VENTOLIN HFA) 108 (90 BASE) MCG/ACT inhaler Inhale 2 puffs into the lungs every 6 (six) hours as needed for wheezing or shortness of breath.    Yes [provider]  aspirin EC 81 MG tablet Take 162 mg by mouth daily with breakfast.    Yes [provider]  fish oil-omega-3 fatty acids 1000 MG capsule Take 1 g by mouth daily with breakfast.    Yes [provider]  ibuprofen (ADVIL,MOTRIN) 200 MG tablet Take 800 mg by mouth every 6 (six) hours as needed for moderate pain.    Yes [provider]  lisinopril (PRINIVIL,ZESTRIL) 20 MG tablet Take 20 mg by mouth every morning.    Yes [provider]  ranitidine (ZANTAC) 150 MG tablet Take 150 mg  by mouth 2 (two) times daily.   Yes [provider]  simvastatin (ZOCOR) 80 MG tablet Take 80 mg by mouth at bedtime.   Yes [provider]  doxycycline (VIBRAMYCIN) 100 MG capsule Take 1 capsule (100 mg total) by mouth 2 (two) times daily. 10/17/17   Derwood Kaplan, MD  fluticasone (FLONASE) 50 MCG/ACT nasal spray Place 1 spray into both nostrils daily. Patient not taking: Reported on 10/17/2017 10/25/16 12/20/16  Caccavale, Sophia, PA-C  predniSONE (DELTASONE) 20 MG tablet Take 2 tablets (40 mg total) by mouth daily. 10/17/17   Derwood Kaplan, MD    Family History Family History  Problem Relation Age of Onset  . Hypertension Mother   . Diabetes Mother     Social History Social History   Tobacco Use  . Smoking status: Current Every Day Smoker    Packs/day: 2.00    Types: Cigarettes  . Smokeless tobacco: Never Used  Substance Use Topics  . Alcohol use: No  . Drug use: No     Allergies   Penicillins and Bactrim [sulfamethoxazole-trimethoprim]   Review of Systems Review of Systems  Constitutional: Positive for activity change.  Respiratory: Positive for shortness of breath.   Cardiovascular: Positive for chest pain.  Gastrointestinal: Negative for nausea and vomiting.  Skin: Negative for rash.  Hematological: Does not bruise/bleed easily.  All other systems  reviewed and are negative.    Physical Exam Updated Vital Signs BP 134/74   Pulse 86   Temp 98.2 F (36.8 C) (Oral)   Resp 20   Ht 5\' 9"  (1.753 m)   SpO2 99%   BMI 36.62 kg/m   Physical Exam  Constitutional: He is oriented to person, place, and time. He appears well-developed.  HENT:  Head: Atraumatic.  Neck: Neck supple.  Cardiovascular: Normal rate.  Pulmonary/Chest: Effort normal. No respiratory distress. He has no decreased breath sounds. He has wheezes. He has no rhonchi. He has no rales.  Patient has bilateral wheezing, right side is worse than the left side.  Neurological: He is  alert and oriented to person, place, and time.  Skin: Skin is warm.  Nursing note and vitals reviewed.    ED Treatments / Results  Labs (all labs ordered are listed, but only abnormal results are displayed) Labs Reviewed - No data to display  EKG EKG Interpretation  Date/Time:  Wednesday October 17 2017 10:56:05 EDT Ventricular Rate:  78 PR Interval:    QRS Duration: 114 QT Interval:  384 QTC Calculation: 438 R Axis:   157 Text Interpretation:  Sinus rhythm IRBBB and LPFB No acute changes Nonspecific ST and T wave abnormality No significant change since last tracing Confirmed by Derwood Kaplan [id number]) on 10/17/2017 11:17:31 AM   Radiology Dg Chest 2 View  Result Date: 10/17/2017 CLINICAL DATA:  Shortness of breath, cough, and congestion. EXAM: CHEST - 2 VIEW COMPARISON:  Chest x-ray dated December 20, 2016. FINDINGS: Stable borderline cardiomegaly. Normal pulmonary vascularity. No focal consolidation, pleural effusion, or pneumothorax. No acute osseous abnormality. IMPRESSION: No active cardiopulmonary disease. Electronically Signed   By: Obie Dredge M.D.   On: 10/17/2017 09:29    Procedures Procedures (including critical care time)  CRITICAL CARE Performed by: Joselynn Amoroso   Total critical care time: 36 minutes for COPD exacerbation requiring multiple reassessments and multiple treatments.  Critical care time was exclusive of separately billable procedures and treating other patients.  Critical care was necessary to treat or prevent imminent or life-threatening deterioration.  Critical care was time spent personally by me on the following activities: development of treatment plan with patient and/or surrogate as well as nursing, discussions with consultants, evaluation of patient's response to treatment, examination of patient, obtaining history from patient or surrogate, ordering and performing treatments and interventions, ordering and review of laboratory studies,  ordering and review of radiographic studies, pulse oximetry and re-evaluation of patient's condition.   Medications Ordered in ED Medications  zolpidem (AMBIEN) tablet 5 mg (5 mg Oral Given by Other 10/17/17 1348)  albuterol (PROVENTIL) (2.5 MG/3ML) 0.083% nebulizer solution 5 mg (5 mg Nebulization Given 10/17/17 0900)  albuterol (PROVENTIL,VENTOLIN) solution continuous neb (10 mg/hr Nebulization Given 10/17/17 1044)  ipratropium (ATROVENT) nebulizer solution 0.5 mg (0.5 mg Nebulization Given 10/17/17 1044)  predniSONE (DELTASONE) tablet 60 mg (60 mg Oral Given 10/17/17 1028)     Initial Impression / Assessment and Plan / ED Course  I have reviewed the triage vital signs and the nursing notes.  Pertinent labs & imaging results that were available during my care of the patient were reviewed by me and considered in my medical decision making (see chart for details).  Clinical Course as of Oct 17 1528  Wed Oct 17, 2017  1334 Repeat exam reveals clearing of wheezing in all lung fields. Patient is not in any respiratory distress nor is there hypoxia.  Strict ER  return precautions have been discussed, and patient is agreeing with the plan and is comfortable with the workup done and the recommendations from the ER.    [AN]    Clinical Course User Index [AN] Derwood KaplanNanavati, Jacquez Sheetz, MD    62 year old male comes in with chief complaint of URI-like symptoms with cough and wheezing.  Patient is also having chest tightness. Patient has history of COPD he continues to smoke.  Based on our exam, which shows wheezing, I think patient is having COPD exacerbation.  X-rays ordered to see if there is any underlying pneumonia.  Hour-long nebulizer treatment is been ordered.  We will reassess the patient and dispo.  Final Clinical Impressions(s) / ED Diagnoses   Final diagnoses:  COPD with acute exacerbation [facility](HCC)    ED Discharge Orders        Ordered    doxycycline (VIBRAMYCIN) 100 MG capsule  2 times  daily     10/17/17 1333    predniSONE (DELTASONE) 20 MG tablet  Daily     10/17/17 1333       Derwood KaplanNanavati, Sherl Yzaguirre, MD 10/17/17 1335    Derwood KaplanNanavati, Makeya Hilgert, MD 10/17/17 1531    Derwood KaplanNanavati, Coyle Stordahl, MD 11/22/17 [id number]

## 2017-10-21 ENCOUNTER — Emergency Department (HOSPITAL_COMMUNITY)
Admission: EM | Admit: 2017-10-21 | Discharge: 2017-10-21 | Disposition: A | Discharge status: Home / Self Care | Payer: Non-veteran care | Attending: Emergency Medicine | Admitting: Emergency Medicine

## 2017-10-21 ENCOUNTER — Encounter (HOSPITAL_COMMUNITY): Payer: Self-pay

## 2017-10-21 ENCOUNTER — Emergency Department (HOSPITAL_COMMUNITY): Payer: Non-veteran care

## 2017-10-21 DIAGNOSIS — J449 Chronic obstructive pulmonary disease, unspecified: Secondary | ICD-10-CM | POA: Diagnosis not present

## 2017-10-21 DIAGNOSIS — F419 Anxiety disorder, unspecified: Secondary | ICD-10-CM | POA: Diagnosis not present

## 2017-10-21 DIAGNOSIS — Z79899 Other long term (current) drug therapy: Secondary | ICD-10-CM | POA: Insufficient documentation

## 2017-10-21 DIAGNOSIS — R0602 Shortness of breath: Secondary | ICD-10-CM | POA: Diagnosis present

## 2017-10-21 DIAGNOSIS — R062 Wheezing: Secondary | ICD-10-CM | POA: Insufficient documentation

## 2017-10-21 DIAGNOSIS — F17210 Nicotine dependence, cigarettes, uncomplicated: Secondary | ICD-10-CM | POA: Diagnosis not present

## 2017-10-21 DIAGNOSIS — E7800 Pure hypercholesterolemia, unspecified: Secondary | ICD-10-CM | POA: Diagnosis not present

## 2017-10-21 DIAGNOSIS — R079 Chest pain, unspecified: Secondary | ICD-10-CM | POA: Insufficient documentation

## 2017-10-21 DIAGNOSIS — Z7982 Long term (current) use of aspirin: Secondary | ICD-10-CM | POA: Insufficient documentation

## 2017-10-21 DIAGNOSIS — R0789 Other chest pain: Secondary | ICD-10-CM

## 2017-10-21 DIAGNOSIS — I10 Essential (primary) hypertension: Secondary | ICD-10-CM | POA: Insufficient documentation

## 2017-10-21 LAB — CBC WITH DIFFERENTIAL/PLATELET
BASOS PCT: 1 %
Basophils Absolute: 0.1 10*3/uL (ref 0.0–0.1)
EOS ABS: 0.4 10*3/uL (ref 0.0–0.7)
Eosinophils Relative: 3 %
HCT: 48.4 % (ref 39.0–52.0)
Hemoglobin: 15.9 g/dL (ref 13.0–17.0)
Lymphocytes Relative: 30 %
Lymphs Abs: 4.3 10*3/uL — ABNORMAL HIGH (ref 0.7–4.0)
MCH: 29.1 pg (ref 26.0–34.0)
MCHC: 32.9 g/dL (ref 30.0–36.0)
MCV: 88.6 fL (ref 78.0–100.0)
MONO ABS: 1.6 10*3/uL — AB (ref 0.1–1.0)
Monocytes Relative: 11 %
Neutro Abs: 7.8 10*3/uL — ABNORMAL HIGH (ref 1.7–7.7)
Neutrophils Relative %: 55 %
PLATELETS: 405 10*3/uL — AB (ref 150–400)
RBC: 5.46 MIL/uL (ref 4.22–5.81)
RDW: 13.6 % (ref 11.5–15.5)
WBC: 14.2 10*3/uL — ABNORMAL HIGH (ref 4.0–10.5)

## 2017-10-21 LAB — URINALYSIS, ROUTINE W REFLEX MICROSCOPIC
Bilirubin Urine: NEGATIVE
Glucose, UA: NEGATIVE mg/dL
HGB URINE DIPSTICK: NEGATIVE
Ketones, ur: NEGATIVE mg/dL
Leukocytes, UA: NEGATIVE
Nitrite: NEGATIVE
Protein, ur: NEGATIVE mg/dL
Specific Gravity, Urine: 1.02 (ref 1.005–1.030)
pH: 5 (ref 5.0–8.0)

## 2017-10-21 LAB — COMPREHENSIVE METABOLIC PANEL
ALK PHOS: 47 U/L (ref 38–126)
ALT: 32 U/L (ref 17–63)
ANION GAP: 11 (ref 5–15)
AST: 26 U/L (ref 15–41)
Albumin: 4 g/dL (ref 3.5–5.0)
BUN: 17 mg/dL (ref 6–20)
CALCIUM: 9.1 mg/dL (ref 8.9–10.3)
CHLORIDE: 104 mmol/L (ref 101–111)
CO2: 21 mmol/L — AB (ref 22–32)
Creatinine, Ser: 0.99 mg/dL (ref 0.61–1.24)
GFR calc non Af Amer: >60 mL/min (ref >60)
Glucose, Bld: 142 mg/dL — ABNORMAL HIGH (ref 65–99)
Potassium: 4.2 mmol/L (ref 3.5–5.1)
SODIUM: 136 mmol/L (ref 135–145)
Total Bilirubin: 0.6 mg/dL (ref 0.3–1.2)
Total Protein: 7.3 g/dL (ref 6.5–8.1)

## 2017-10-21 LAB — TROPONIN I: Troponin I: <0.03 ng/mL (ref <0.03)

## 2017-10-21 LAB — BRAIN NATRIURETIC PEPTIDE: B Natriuretic Peptide: 73.8 pg/mL (ref 0.0–100.0)

## 2017-10-21 MED ORDER — METHYLPREDNISOLONE SODIUM SUCC 125 MG IJ SOLR
125.0000 mg | Freq: Once | INTRAMUSCULAR | Status: AC
  Administered 2017-10-21: 125 mg via INTRAVENOUS
  Filled 2017-10-21: qty 2

## 2017-10-21 MED ORDER — ALBUTEROL (5 MG/ML) CONTINUOUS INHALATION SOLN
10.0000 mg/h | INHALATION_SOLUTION | RESPIRATORY_TRACT | Status: DC
  Filled 2017-10-21: qty 20

## 2017-10-21 MED ORDER — ALPRAZOLAM 0.25 MG PO TABS: 0.2500 mg | ORAL_TABLET | Freq: Every evening | ORAL | 0 refills | Status: DC | PRN

## 2017-10-21 NOTE — ED Provider Notes (Signed)
[facility] EMERGENCY DEPARTMENT Provider Note   CSN: [id number] Arrival date & time: 10/21/17  1453     History   Chief Complaint Chief Complaint  Patient presents with  . Shortness of Breath    HPI Dennis Dominguez is a 62 y.o. male.  HPI Patient has history of COPD.  Was seen in the emergency department 4 days ago for cough, chest tightness and shortness of breath.  Treated with nebulized albuterol treatments, started on doxycycline and short course of prednisone.  Patient states that continues to have nonproductive cough and chest tightness.  This is episodic.  Currently denies any chest tightness.  He has hot and cold flashes and diaphoresis.  States that getting out of shower today he had lightheaded episode and felt like he may pass out.  No trauma.  Denies nausea, vomiting or abdominal pain.  He has had loose stools since starting the antibiotic.  No grossly body or melanotic stools.  Reports increasing anxiety and difficulty sleeping.  No known history of heart disease.  Father and grandfather with coronary artery disease.  No new lower extremity swelling or pain. Past Medical History:  Diagnosis Date  . Asthma   . COPD (chronic obstructive pulmonary disease) (HCC)   . High cholesterol   . Hypertension   . Kidney stones   . Renal disorder     There are no active problems to display for this patient.   Past Surgical History:  Procedure Laterality Date  . lithrotripsy          Home Medications    Prior to Admission medications   Medication Sig Start Date End Date Taking? Authorizing Provider  albuterol (PROVENTIL HFA;VENTOLIN HFA) 108 (90 BASE) MCG/ACT inhaler Inhale 2 puffs into the lungs every 6 (six) hours as needed for wheezing or shortness of breath.    Yes [provider]  aspirin EC 81 MG tablet Take 162 mg by mouth daily with breakfast.    Yes [provider]  fish oil-omega-3 fatty acids 1000 MG capsule Take 1 g by  mouth daily with breakfast.    Yes [provider]  ibuprofen (ADVIL,MOTRIN) 200 MG tablet Take 800 mg by mouth every 6 (six) hours as needed for moderate pain.    Yes [provider]  lisinopril (PRINIVIL,ZESTRIL) 20 MG tablet Take 20 mg by mouth every morning.    Yes [provider]  ranitidine (ZANTAC) 150 MG tablet Take 150 mg by mouth 2 (two) times daily.   Yes [provider]  simvastatin (ZOCOR) 80 MG tablet Take 80 mg by mouth at bedtime.   Yes [provider]  ALPRAZolam (XANAX) 0.25 MG tablet Take 1 tablet (0.25 mg total) by mouth at bedtime as needed for anxiety. 10/21/17   Loren Racer, MD  doxycycline (VIBRAMYCIN) 100 MG capsule Take 1 capsule (100 mg total) by mouth 2 (two) times daily. Patient not taking: Reported on 10/21/2017 10/17/17   Derwood Kaplan, MD  fluticasone (FLONASE) 50 MCG/ACT nasal spray Place 1 spray into both nostrils daily. Patient not taking: Reported on 10/17/2017 10/25/16 12/20/16  Caccavale, Sophia, PA-C  predniSONE (DELTASONE) 20 MG tablet Take 2 tablets (40 mg total) by mouth daily. Patient not taking: Reported on 10/21/2017 10/17/17   Derwood Kaplan, MD    Family History Family History  Problem Relation Age of Onset  . Hypertension Mother   . Diabetes Mother     Social History Social History   Tobacco Use  .  Smoking status: Current Every Day Smoker    Packs/day: 2.00    Types: Cigarettes  . Smokeless tobacco: Never Used  Substance Use Topics  . Alcohol use: No  . Drug use: No     Allergies   Penicillins and Bactrim [sulfamethoxazole-trimethoprim]   Review of Systems Review of Systems  Constitutional: Positive for chills, diaphoresis and fatigue. Negative for fever.  HENT: Negative for sore throat and trouble swallowing.   Eyes: Negative for visual disturbance.  Respiratory: Positive for cough, chest tightness, shortness of breath and wheezing.   Cardiovascular: Negative for palpitations and  leg swelling.  Gastrointestinal: Positive for diarrhea. Negative for abdominal pain, blood in stool, constipation, nausea and vomiting.  Genitourinary: Negative for dysuria, flank pain, frequency and hematuria.  Musculoskeletal: Negative for back pain, myalgias and neck pain.  Skin: Negative for rash and wound.  Neurological: Positive for dizziness, tremors and light-headedness. Negative for weakness, numbness and headaches.  Psychiatric/Behavioral: Positive for sleep disturbance. The patient is nervous/anxious.   All other systems reviewed and are negative.    Physical Exam Updated Vital Signs BP (!) 144/81 (BP Location: Right Arm)   Pulse 74   Temp 98.2 F (36.8 C) (Oral)   Resp 18   SpO2 96%   Physical Exam  Constitutional: He is oriented to person, place, and time. He appears well-developed and well-nourished.  Anxious appearing  HENT:  Head: Normocephalic and atraumatic.  Mouth/Throat: Oropharynx is clear and moist. No oropharyngeal exudate.  Eyes: Pupils are equal, round, and reactive to light. EOM are normal.  Neck: Normal range of motion. Neck supple. No JVD present.  Cardiovascular: Normal rate and regular rhythm. Exam reveals no gallop and no friction rub.  No murmur heard. Pulmonary/Chest: Effort normal. He has wheezes.  End expiratory wheezes and bilateral lung fields  Abdominal: Soft. Bowel sounds are normal. There is no tenderness. There is no rebound and no guarding.  Musculoskeletal: Normal range of motion. He exhibits no edema or tenderness.  No lower extremity swelling, asymmetry or tenderness.  Lymphadenopathy:    He has no cervical adenopathy.  Neurological: He is alert and oriented to person, place, and time.  Moves all extremities without focal deficit.  Sensation intact.  Skin: Skin is warm and dry. Capillary refill takes less than 2 seconds. No rash noted. He is not diaphoretic. No erythema.  Psychiatric: His behavior is normal.  Nursing note and  vitals reviewed.    ED Treatments / Results  Labs (all labs ordered are listed, but only abnormal results are displayed) Labs Reviewed  CBC WITH DIFFERENTIAL/PLATELET - Abnormal; Notable for the following components:      Result Value   WBC 14.2 (*)    Platelets 405 (*)    Neutro Abs 7.8 (*)    Lymphs Abs 4.3 (*)    Monocytes Absolute 1.6 (*)    All other components within normal limits  COMPREHENSIVE METABOLIC PANEL - Abnormal; Notable for the following components:   CO2 21 (*)    Glucose, Bld 142 (*)    All other components within normal limits  BRAIN NATRIURETIC PEPTIDE  TROPONIN I  URINALYSIS, ROUTINE W REFLEX MICROSCOPIC    EKG EKG Interpretation  Date/Time:  Sunday October 21 2017 15:03:24 EDT Ventricular Rate:  74 PR Interval:    QRS Duration: 103 QT Interval:  377 QTC Calculation: 419 R Axis:   -33 Text Interpretation:  Sinus rhythm Left axis deviation RSR' in V1 or V2, probably normal variant Confirmed by  Loren RacerYelverton, Thorvald Orsino ([id number]) on 10/21/2017 4:00:10 PM   Radiology No results found.  Procedures Procedures (including critical care time)  Medications Ordered in ED Medications  methylPREDNISolone sodium succinate (SOLU-MEDROL) 125 mg/2 mL injection 125 mg (125 mg Intravenous Given 10/21/17 1827)     Initial Impression / Assessment and Plan / ED Course  I have reviewed the triage vital signs and the nursing notes.  Pertinent labs & imaging results that were available during my care of the patient were reviewed by me and considered in my medical decision making (see chart for details).     Patient feels much better after nebulized treatment x2.  Lungs are clear.  Denies chest tightness or pain.  EKG without ischemic findings.  Patient has had 4 days of chest tightness so single troponin is sufficient to rule out acute MI.  Symptoms likely due to COPD exacerbation.  Will give short course of steroids.  Return precautions given.  Final Clinical Impressions(s)  / ED Diagnoses   Final diagnoses:  Chest tightness  Wheezing  Anxiety    ED Discharge Orders        Ordered    ALPRAZolam (XANAX) 0.25 MG tablet  At bedtime PRN     10/21/17 1843       Loren RacerYelverton, Zaydn Gutridge, MD 10/29/17 1105

## 2017-10-21 NOTE — ED Notes (Signed)
ED Provider at bedside. 

## 2017-10-21 NOTE — ED Triage Notes (Signed)
To room via EMS.  C/o shortness of breath, chest tightness, diarrhea, dizzy, abd is more distended than usual.  [facility] and [facility] reported slight improvement in shortness of breath.   Fire reported SpO2 94% room air.  EMS BP 150/100 HR 76, SpO2 98% on HHN.

## 2017-10-30 ENCOUNTER — Encounter (HOSPITAL_COMMUNITY): Payer: Self-pay

## 2017-10-30 ENCOUNTER — Emergency Department (HOSPITAL_COMMUNITY)
Admission: EM | Admit: 2017-10-30 | Discharge: 2017-10-30 | Disposition: A | Discharge status: Home / Self Care | Payer: Non-veteran care | Attending: Emergency Medicine | Admitting: Emergency Medicine

## 2017-10-30 ENCOUNTER — Emergency Department (HOSPITAL_COMMUNITY): Payer: Non-veteran care

## 2017-10-30 ENCOUNTER — Other Ambulatory Visit: Payer: Self-pay

## 2017-10-30 DIAGNOSIS — Z79899 Other long term (current) drug therapy: Secondary | ICD-10-CM | POA: Insufficient documentation

## 2017-10-30 DIAGNOSIS — Z7982 Long term (current) use of aspirin: Secondary | ICD-10-CM | POA: Diagnosis not present

## 2017-10-30 DIAGNOSIS — F419 Anxiety disorder, unspecified: Secondary | ICD-10-CM | POA: Diagnosis not present

## 2017-10-30 DIAGNOSIS — I10 Essential (primary) hypertension: Secondary | ICD-10-CM | POA: Insufficient documentation

## 2017-10-30 DIAGNOSIS — J449 Chronic obstructive pulmonary disease, unspecified: Secondary | ICD-10-CM | POA: Insufficient documentation

## 2017-10-30 DIAGNOSIS — F17210 Nicotine dependence, cigarettes, uncomplicated: Secondary | ICD-10-CM | POA: Insufficient documentation

## 2017-10-30 DIAGNOSIS — R0602 Shortness of breath: Secondary | ICD-10-CM | POA: Diagnosis present

## 2017-10-30 DIAGNOSIS — F17200 Nicotine dependence, unspecified, uncomplicated: Secondary | ICD-10-CM

## 2017-10-30 LAB — CBC
HCT: 46.6 % (ref 39.0–52.0)
Hemoglobin: 16.2 g/dL (ref 13.0–17.0)
MCH: 31.1 pg (ref 26.0–34.0)
MCHC: 34.8 g/dL (ref 30.0–36.0)
MCV: 89.4 fL (ref 78.0–100.0)
PLATELETS: 400 10*3/uL (ref 150–400)
RBC: 5.21 MIL/uL (ref 4.22–5.81)
RDW: 14.3 % (ref 11.5–15.5)
WBC: 16.1 10*3/uL — ABNORMAL HIGH (ref 4.0–10.5)

## 2017-10-30 LAB — BASIC METABOLIC PANEL
ANION GAP: 10 (ref 5–15)
BUN: 22 mg/dL (ref 8–23)
CO2: 21 mmol/L — AB (ref 22–32)
Calcium: 9.4 mg/dL (ref 8.9–10.3)
Chloride: 104 mmol/L (ref 98–111)
Creatinine, Ser: 1.04 mg/dL (ref 0.61–1.24)
GFR calc Af Amer: >60 mL/min (ref >60)
GLUCOSE: 213 mg/dL — AB (ref 70–99)
POTASSIUM: 5 mmol/L (ref 3.5–5.1)
Sodium: 135 mmol/L (ref 135–145)

## 2017-10-30 LAB — I-STAT TROPONIN, ED: TROPONIN I, POC: 0 ng/mL (ref 0.00–0.08)

## 2017-10-30 MED ORDER — HYDROXYZINE HCL 25 MG PO TABS: 25.0000 mg | ORAL_TABLET | Freq: Three times a day (TID) | ORAL | 0 refills | Status: DC | PRN

## 2017-10-30 MED ORDER — IPRATROPIUM-ALBUTEROL 0.5-2.5 (3) MG/3ML IN SOLN
3.0000 mL | Freq: Once | RESPIRATORY_TRACT | Status: AC
  Administered 2017-10-30: 3 mL via RESPIRATORY_TRACT
  Filled 2017-10-30: qty 3

## 2017-10-30 NOTE — ED Provider Notes (Signed)
[facility]-EMERGENCY DEPT Provider Note   CSN: [id number] Arrival date & time: 10/30/17  0719     History   Chief Complaint Chief Complaint  Patient presents with  . Shortness of Breath  . Chest Pain    HPI Dennis Dominguez is a 62 y.o. male.  62 year old male with past medical history including COPD, tobacco use, hypertension, hyperlipidemia, kidney stones who presents with shortness of breath, chest pain, and anxiety.  Patient states that for the past 2 weeks he has had intermittent shortness of breath and chest tightness.  He reports significant stress and anxiety mainly related to financial problems and having a hard time affording his bills.  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  He reports poor sleep and that he has problems functioning because he gets anxious which makes him short of breath. These of the same symptoms for which he presented on 6/12, was given doxycycline and prednisone course.  He took 6 days of the doxycycline but states that it gave him side effects of diarrhea and feeling bad so he stopped it.  He initially did not fill the prednisone due to cost but later filled it and today took his last dose.  He was unable to specify what exactly brought him in this morning. He later stated "I need something to help me sleep" and then later stated he came here by ambulance because he [place]'t afford [place] [facility] [place] [facility] [place]. He continues to smoke cigarettes but has cut back. He continues to have cough. Symptoms improve with albuterol.  The history is provided by the patient.  Shortness of Breath  Associated symptoms include chest pain.  Chest Pain   Associated symptoms include shortness of breath.    Past Medical History:  Diagnosis Date  . Asthma   . COPD (chronic obstructive pulmonary disease) (HCC)   . High cholesterol   . Hypertension   . Kidney stones   . Renal  disorder     There are no active problems to display for this patient.   Past Surgical History:  Procedure Laterality Date  . lithrotripsy          Home Medications    Prior to Admission medications   Medication Sig Start Date End Date Taking? Authorizing Provider  albuterol (PROVENTIL HFA;VENTOLIN HFA) 108 (90 BASE) MCG/ACT inhaler Inhale 2 puffs into the lungs every 6 (six) hours as needed for wheezing or shortness of breath.    Yes [provider]  ALPRAZolam (XANAX) 0.25 MG tablet Take 1 tablet (0.25 mg total) by mouth at bedtime as needed for anxiety. 10/21/17  Yes Loren RacerYelverton, David, MD  aspirin EC 81 MG tablet Take 162 mg by mouth daily with breakfast.    Yes [provider]  fish oil-omega-3 fatty acids 1000 MG capsule Take 1 g by mouth daily with breakfast.    Yes [provider]  ibuprofen (ADVIL,MOTRIN) 200 MG tablet Take 400-600 mg by mouth every 6 (six) hours as needed for moderate pain.    Yes [provider]  lisinopril (PRINIVIL,ZESTRIL) 20 MG tablet Take 20 mg by mouth every morning.    Yes [provider]  predniSONE (DELTASONE) 20 MG tablet Take 2 tablets (40 mg total) by mouth daily. 10/17/17  Yes Derwood KaplanNanavati, Ankit, MD  ranitidine (ZANTAC) 150 MG tablet Take 150 mg by mouth 2 (two) times daily.  Yes [provider]  simvastatin (ZOCOR) 80 MG tablet Take 80 mg by mouth at bedtime.   Yes [provider]  doxycycline (VIBRAMYCIN) 100 MG capsule Take 1 capsule (100 mg total) by mouth 2 (two) times daily. Patient not taking: Reported on 10/21/2017 10/17/17   Derwood Kaplan, MD  hydrOXYzine (ATARAX/VISTARIL) 25 MG tablet Take 1 tablet (25 mg total) by mouth every 8 (eight) hours as needed for anxiety. 10/30/17   Joaquin Knebel, Ambrose Finland, MD    Family History Family History  Problem Relation Age of Onset  . Hypertension Mother   . Diabetes Mother     Social History Social History   Tobacco Use  . Smoking  status: Current Every Day Smoker    Packs/day: 2.00    Types: Cigarettes  . Smokeless tobacco: Never Used  Substance Use Topics  . Alcohol use: No  . Drug use: No     Allergies   Penicillins and Bactrim [sulfamethoxazole-trimethoprim]   Review of Systems Review of Systems  Respiratory: Positive for shortness of breath.   Cardiovascular: Positive for chest pain.   All other systems reviewed and are negative except that which was mentioned in HPI   Physical Exam Updated Vital Signs BP 132/85   Pulse 72   Temp 97.9 F (36.6 C) (Oral)   Resp 18   Ht 5\' 9"  (1.753 m)   Wt 112.5 kg (248 lb)   SpO2 98%   BMI 36.62 kg/m   Physical Exam  Constitutional: He is oriented to person, place, and time. He appears well-developed and well-nourished. No distress.  HENT:  Head: Normocephalic and atraumatic.  Moist mucous membranes  Eyes: Pupils are equal, round, and reactive to light. Conjunctivae are normal.  Neck: Neck supple.  Cardiovascular: Normal rate, regular rhythm and normal heart sounds.  No murmur heard. Pulmonary/Chest: Effort normal. He has wheezes.  Normal WOB, no accessory muscle use, end-expiratory wheezes b/l bases  Abdominal: Soft. Bowel sounds are normal. He exhibits no distension. There is no tenderness.  Musculoskeletal: He exhibits no edema.  Neurological: He is alert and oriented to person, place, and time.  Fluent speech  Skin: Skin is warm and dry.  Psychiatric: Judgment normal. His mood appears anxious. He is agitated.  Nursing note and vitals reviewed.    ED Treatments / Results  Labs (all labs ordered are listed, but only abnormal results are displayed) Labs Reviewed  BASIC METABOLIC PANEL - Abnormal; Notable for the following components:      Result Value   CO2 21 (*)    Glucose, Bld 213 (*)    All other components within normal limits  CBC - Abnormal; Notable for the following components:   WBC 16.1 (*)    All other components within normal  limits  I-STAT TROPONIN, ED    EKG EKG Interpretation  Date/Time:  Tuesday October 30 2017 07:33:34 EDT Ventricular Rate:  78 PR Interval:    QRS Duration: 99 QT Interval:  365 QTC Calculation: 416 R Axis:   -4 Text Interpretation:  Sinus rhythm RSR' in V1 or V2, right VCD or RVH No significant change since last tracing Confirmed by Frederick Peers [id number]) on 10/30/2017 7:59:36 AM Also confirmed by Frederick Peers [phone]), editor Sheppard Evens ([id number])  on 10/30/2017 8:04:18 AM   Radiology Dg Chest 2 View  Result Date: 10/30/2017 CLINICAL DATA:  Increasing shortness of breath over the past week. EXAM: CHEST - 2 VIEW COMPARISON:  Chest x-ray dated October 21, 2017. FINDINGS:  Stable mild cardiomegaly. Normal pulmonary vascularity. Coarsened interstitial changes and central peribronchial thickening are unchanged, and likely smoking-related. No focal consolidation, pleural effusion, or pneumothorax. No acute osseous abnormality. IMPRESSION: 1. Chronic bronchitic changes, likely smoking-related. No active cardiopulmonary disease. Electronically Signed   By: Obie Dredge M.D.   On: 10/30/2017 08:29    Procedures Procedures (including critical care time)  Medications Ordered in ED Medications  ipratropium-albuterol (DUONEB) 0.5-2.5 (3) MG/3ML nebulizer solution 3 mL (3 mLs Nebulization Given 10/30/17 1610)     Initial Impression / Assessment and Plan / ED Course  I have reviewed the triage vital signs and the nursing notes.  Pertinent labs & imaging results that were available during my care of the patient were reviewed by me and considered in my medical decision making (see chart for details).    Pt was well appearing although anxious on exam, 98% on RA, normal WOB, end-expiratory wheezes b/l.  He admits to me that he is only partially compliant with medications due to financial costs.  Today, he appears well and has no oxygen requirement, no increased work of breathing and I suspect that  his mild end expiratory wheezes may be baseline based on his known COPD and continued tobacco use.  He perseverates on his anxiety symptoms and poor sleep.  I explained that I do not prescribe benzos for anxiety but will be able to provide Atarax to help with sleep at night.  He requested more medications including more oral steroids.  I explained the side effects of steroids including sleep disturbance, weight gain, hyperglycemia, and feel that risks outweigh benefits of another oral steroid course; his labwork demonstrates hyperglycemia and this will worsen if he continues oral steroids. Rather he needs to establish care w/ PCP for long term inhaled corticosteroids and referral to pulmonology.  Labs including trop are reassuring. BG 213, WBC 16 likely 2/2 steroid use. CXR negative acute.   I contacted our social worker, Chanetta Marshall, who [facility] for [facility] with medication costs and transportation. While awaiting her phone call back, I was informed by nursing staff that he had gone to restroom and pulled out his IV requesting to go home. I went to explain to him that I was awaiting SW. He stated "well you are doing nothing for me so I want to go home." I again explained that I did not feel oral steroids would benefit him and rather he would greatly benefit from outpatient care including pulmonology for better COPD management. He continued to argue that this would not help him. He was not interested in staying for SW assistance and thus was discharged with instructions to contact the [place] as soon as possible.  Final Clinical Impressions(s) / ED Diagnoses   Final diagnoses:  Shortness of breath  Anxiety  Tobacco use disorder    ED Discharge Orders        Ordered    hydrOXYzine (ATARAX/VISTARIL) 25 MG tablet  Every 8 hours PRN     10/30/17 0913       Osa Fogarty, Ambrose Finland, MD 10/30/17 [phone]

## 2017-10-30 NOTE — ED Triage Notes (Signed)
Patient c/o SOB x 2 weeks and intermittent chest pain x 1 week. Patient reports a history of COPD.

## 2017-10-30 NOTE — Progress Notes (Signed)
RT protocol assessment completed during tx. Patient currently still smoking.

## 2017-10-30 NOTE — Discharge Instructions (Signed)
IT IS VERY IMPORTANT FOR YOU TO SEE THE [facility] FOR ONGOING CARE OF YOUR COPD. THERE ARE LONG-TERM COPD MEDICATIONS THAT WOULD HELP YOU. THERE ARE ALSO TREATMENTS AVAILABLE TO HELP WITH ANXIETY. [facility]CT THE [facility] FOR THE NEXT AVAILABLE APPOINTMENT. RETURN TO ER IF YOU HAVE SIGNIFICANT WORSENING OF YOUR BREATHING OR CHEST PAIN, OR IF YOU DEVELOP FEVER.

## 2018-04-08 ENCOUNTER — Other Ambulatory Visit: Payer: Self-pay

## 2018-04-08 ENCOUNTER — Encounter (HOSPITAL_COMMUNITY): Payer: Self-pay | Admitting: *Deleted

## 2018-04-08 ENCOUNTER — Emergency Department (HOSPITAL_COMMUNITY): Payer: Non-veteran care

## 2018-04-08 ENCOUNTER — Emergency Department (HOSPITAL_COMMUNITY)
Admission: EM | Admit: 2018-04-08 | Discharge: 2018-04-08 | Disposition: A | Discharge status: Home / Self Care | Payer: Non-veteran care | Attending: Emergency Medicine | Admitting: Emergency Medicine

## 2018-04-08 DIAGNOSIS — J209 Acute bronchitis, unspecified: Secondary | ICD-10-CM | POA: Diagnosis not present

## 2018-04-08 DIAGNOSIS — Z79899 Other long term (current) drug therapy: Secondary | ICD-10-CM | POA: Insufficient documentation

## 2018-04-08 DIAGNOSIS — I10 Essential (primary) hypertension: Secondary | ICD-10-CM | POA: Diagnosis not present

## 2018-04-08 DIAGNOSIS — F17210 Nicotine dependence, cigarettes, uncomplicated: Secondary | ICD-10-CM | POA: Insufficient documentation

## 2018-04-08 DIAGNOSIS — J449 Chronic obstructive pulmonary disease, unspecified: Secondary | ICD-10-CM | POA: Insufficient documentation

## 2018-04-08 DIAGNOSIS — R062 Wheezing: Secondary | ICD-10-CM | POA: Diagnosis present

## 2018-04-08 DIAGNOSIS — Z7982 Long term (current) use of aspirin: Secondary | ICD-10-CM | POA: Diagnosis not present

## 2018-04-08 LAB — CBC WITH DIFFERENTIAL/PLATELET
Abs Immature Granulocytes: 0.05 10*3/uL (ref 0.00–0.07)
BASOS ABS: 0.1 10*3/uL (ref 0.0–0.1)
Basophils Relative: 1 %
Eosinophils Absolute: 0.3 10*3/uL (ref 0.0–0.5)
Eosinophils Relative: 3 %
HEMATOCRIT: 48 % (ref 39.0–52.0)
Hemoglobin: 15.6 g/dL (ref 13.0–17.0)
IMMATURE GRANULOCYTES: 0 %
LYMPHS PCT: 25 %
Lymphs Abs: 2.8 10*3/uL (ref 0.7–4.0)
MCH: 29.3 pg (ref 26.0–34.0)
MCHC: 32.5 g/dL (ref 30.0–36.0)
MCV: 90.2 fL (ref 80.0–100.0)
MONOS PCT: 10 %
Monocytes Absolute: 1.1 10*3/uL — ABNORMAL HIGH (ref 0.1–1.0)
NEUTROS PCT: 61 %
NRBC: 0 % (ref 0.0–0.2)
Neutro Abs: 6.9 10*3/uL (ref 1.7–7.7)
Platelets: 401 10*3/uL — ABNORMAL HIGH (ref 150–400)
RBC: 5.32 MIL/uL (ref 4.22–5.81)
RDW: 13.3 % (ref 11.5–15.5)
WBC: 11.3 10*3/uL — ABNORMAL HIGH (ref 4.0–10.5)

## 2018-04-08 LAB — BASIC METABOLIC PANEL
ANION GAP: 9 (ref 5–15)
BUN: 13 mg/dL (ref 8–23)
CALCIUM: 9.8 mg/dL (ref 8.9–10.3)
CHLORIDE: 99 mmol/L (ref 98–111)
CO2: 23 mmol/L (ref 22–32)
Creatinine, Ser: 1.1 mg/dL (ref 0.61–1.24)
GFR calc Af Amer: >60 mL/min (ref >60)
GFR calc non Af Amer: >60 mL/min (ref >60)
GLUCOSE: 154 mg/dL — AB (ref 70–99)
Potassium: 4.7 mmol/L (ref 3.5–5.1)
Sodium: 131 mmol/L — ABNORMAL LOW (ref 135–145)

## 2018-04-08 LAB — BRAIN NATRIURETIC PEPTIDE: B NATRIURETIC PEPTIDE 5: 18.8 pg/mL (ref 0.0–100.0)

## 2018-04-08 LAB — I-STAT TROPONIN, ED: Troponin i, poc: 0 ng/mL (ref 0.00–0.08)

## 2018-04-08 MED ORDER — AZITHROMYCIN 250 MG PO TABS: ORAL_TABLET | ORAL | 0 refills | Status: DC

## 2018-04-08 MED ORDER — ALBUTEROL SULFATE (2.5 MG/3ML) 0.083% IN NEBU
5.0000 mg | INHALATION_SOLUTION | Freq: Once | RESPIRATORY_TRACT | Status: AC
  Administered 2018-04-08: 5 mg via RESPIRATORY_TRACT
  Filled 2018-04-08: qty 6

## 2018-04-08 MED ORDER — IPRATROPIUM BROMIDE 0.02 % IN SOLN
0.5000 mg | Freq: Once | RESPIRATORY_TRACT | Status: AC
  Administered 2018-04-08: 0.5 mg via RESPIRATORY_TRACT
  Filled 2018-04-08: qty 2.5

## 2018-04-08 MED ORDER — PREDNISONE 10 MG (21) PO TBPK: ORAL_TABLET | ORAL | 0 refills | Status: DC

## 2018-04-08 MED ORDER — METHYLPREDNISOLONE SODIUM SUCC 125 MG IJ SOLR
125.0000 mg | Freq: Once | INTRAMUSCULAR | Status: AC
  Administered 2018-04-08: 125 mg via INTRAVENOUS
  Filled 2018-04-08: qty 2

## 2018-04-08 NOTE — Discharge Instructions (Signed)
Contact a health care provider if: °Your symptoms do not improve in 2 weeks of treatment. °Get help right away if: °You cough up blood. °You have chest pain. °You have severe shortness of breath. °You become dehydrated. °You faint or keep feeling like you are going to faint. °You keep vomiting. °You have a severe headache. °Your fever or chills gets worse. °

## 2018-04-08 NOTE — ED Triage Notes (Signed)
C/o nasal congestion for several days using otc medications without relief.

## 2018-04-08 NOTE — ED Provider Notes (Signed)
[facility] EMERGENCY DEPARTMENT Provider Note   CSN: [id number] Arrival date & time: 04/08/18  0555     History   Chief Complaint Chief Complaint  Patient presents with  . Nasal Congestion    HPI Dennis Dominguez is a 62 y.o. male   URI   This is a new problem. The current episode started more than 2 days ago. The problem has not changed since onset.Maximum temperature: subjective fevers. The fever has been present for 3 to 4 days. Associated symptoms include chest pain (with cough), congestion (chest congestion), headaches and wheezing. Pertinent negatives include no abdominal pain, no diarrhea, no nausea, no vomiting, no dysuria, no ear pain, no plugged ear sensation, no rhinorrhea, no sinus pain, no sneezing, no sore throat, no swollen glands, no joint pain, no joint swelling, no neck pain, no cough and no rash. He has tried an inhaler, NSAIDs, other medications and rest for the symptoms. The treatment provided no relief.    Past Medical History:  Diagnosis Date  . Asthma   . COPD (chronic obstructive pulmonary disease) (HCC)   . High cholesterol   . Hypertension   . Kidney stones   . Renal disorder     There are no active problems to display for this patient.   Past Surgical History:  Procedure Laterality Date  . lithrotripsy          Home Medications    Prior to Admission medications   Medication Sig Start Date End Date Taking? Authorizing Provider  albuterol (PROVENTIL HFA;VENTOLIN HFA) 108 (90 BASE) MCG/ACT inhaler Inhale 2 puffs into the lungs every 6 (six) hours as needed for wheezing or shortness of breath.     [provider]  ALPRAZolam Prudy Feeler) 0.25 MG tablet Take 1 tablet (0.25 mg total) by mouth at bedtime as needed for anxiety. 10/21/17   Loren Racer, MD  aspirin EC 81 MG tablet Take 162 mg by mouth daily with breakfast.     [provider]  doxycycline (VIBRAMYCIN) 100 MG capsule Take 1 capsule (100 mg  total) by mouth 2 (two) times daily. Patient not taking: Reported on 10/21/2017 10/17/17   Derwood Kaplan, MD  fish oil-omega-3 fatty acids 1000 MG capsule Take 1 g by mouth daily with breakfast.     [provider]  hydrOXYzine (ATARAX/VISTARIL) 25 MG tablet Take 1 tablet (25 mg total) by mouth every 8 (eight) hours as needed for anxiety. 10/30/17   Little, Ambrose Finland, MD  ibuprofen (ADVIL,MOTRIN) 200 MG tablet Take 400-600 mg by mouth every 6 (six) hours as needed for moderate pain.     [provider]  lisinopril (PRINIVIL,ZESTRIL) 20 MG tablet Take 20 mg by mouth every morning.     [provider]  predniSONE (DELTASONE) 20 MG tablet Take 2 tablets (40 mg total) by mouth daily. 10/17/17   Derwood Kaplan, MD  ranitidine (ZANTAC) 150 MG tablet Take 150 mg by mouth 2 (two) times daily.    [provider]  simvastatin (ZOCOR) 80 MG tablet Take 80 mg by mouth at bedtime.    [provider]    Family History Family History  Problem Relation Age of Onset  . Hypertension Mother   . Diabetes Mother     Social History Social History   Tobacco Use  . Smoking status: Current Every Day Smoker    Packs/day: 2.00    Types: Cigarettes  . Smokeless tobacco: Never Used  Substance Use Topics  .  Alcohol use: No  . Drug use: No     Allergies   Penicillins and Bactrim [sulfamethoxazole-trimethoprim]   Review of Systems Review of Systems  HENT: Positive for congestion (chest congestion). Negative for ear pain, rhinorrhea, sinus pain, sneezing and sore throat.   Respiratory: Positive for wheezing. Negative for cough.   Cardiovascular: Positive for chest pain (with cough).  Gastrointestinal: Negative for abdominal pain, diarrhea, nausea and vomiting.  Genitourinary: Negative for dysuria.  Musculoskeletal: Negative for joint pain and neck pain.  Skin: Negative for rash.  Neurological: Positive for headaches.     Physical Exam Updated Vital  Signs BP 120/79 (BP Location: Right Arm)   Pulse 82   Temp 98.2 F (36.8 C) (Oral)   Resp 20   Ht 5\' 9"  (1.753 m)   Wt 108.9 kg   SpO2 96%   BMI 35.44 kg/m   Physical Exam  Constitutional: He appears well-developed and well-nourished. No distress.  Smells of cigarettes  HENT:  Head: Normocephalic and atraumatic.  Eyes: Conjunctivae are normal. No scleral icterus.  Neck: Normal range of motion. Neck supple.  Cardiovascular: Normal rate, regular rhythm and normal heart sounds.  Pulmonary/Chest: Effort normal. No respiratory distress. He has wheezes.  Abdominal: Soft. There is no tenderness.  Musculoskeletal: He exhibits no edema.  Neurological: He is alert.  Skin: Skin is warm and dry. He is not diaphoretic.  Psychiatric: His behavior is normal.  Nursing note and vitals reviewed.    ED Treatments / Results  Labs (all labs ordered are listed, but only abnormal results are displayed) Labs Reviewed  BASIC METABOLIC PANEL  CBC WITH DIFFERENTIAL/PLATELET  BRAIN NATRIURETIC PEPTIDE  I-STAT TROPONIN, ED    EKG EKG Interpretation  Date/Time:  Monday April 08 2018 08:38:03 EST Ventricular Rate:  75 PR Interval:  134 QRS Duration: 88 QT Interval:  378 QTC Calculation: 422 R Axis:   29 Text Interpretation:  Normal sinus rhythm Normal ECG Confirmed by Cathren Laine ([id number]) on 04/08/2018 8:47:36 AM   Radiology No results found.  Procedures Procedures (including critical care time)  Medications Ordered in ED Medications  ipratropium (ATROVENT) nebulizer solution 0.5 mg (has no administration in time range)  methylPREDNISolone sodium succinate (SOLU-MEDROL) 125 mg/2 mL injection 125 mg (has no administration in time range)  albuterol (PROVENTIL) (2.5 MG/3ML) 0.083% nebulizer solution 5 mg (has no administration in time range)     Initial Impression / Assessment and Plan / ED Course  I have reviewed the triage vital signs and the nursing notes.  Pertinent labs &  imaging results that were available during my care of the patient were reviewed by me and considered in my medical decision making (see chart for details).  Clinical Course as of Apr 08 808  Mon Apr 08, 2018  0746 WBC(!): 11.3 [AH]  0808 Troponin i, poc: 0.00 [AH]  0809 B Natriuretic Peptide: 18.8 [AH]    Clinical Course User Index [AH] Arthor Captain, PA-C    62y/o ,male with hx of COPD/ smoking. He has recent URI sxs. C/o chest congestion with chest pressure. The emergent differential diagnosis of chest pain/pressure includes: Acute coronary syndrome, pericarditis, aortic dissection, pulmonary embolism, tension pneumothorax, pneumonia, asthma and esophageal rupture. I personally reviewed the pa and lateral chest films that show no evidence of CAP. EKG shows NSR with no evidence of arrhtyhmia or ichemia, troponin negative and I have very low suspicion for  Patient treated for copd exacerbation with albuterol/ atrovent and steroid with marked improvement  in his sxs. Patient with mixed acute bronchitis/ copd exacerbation. The patient was counseled on the dangers of tobacco use, and was advised to quit. Reviewed strategies to maximize success, including removing cigarettes and smoking materials from environment, stress management, substitution of other forms of reinforcement, support of family/friends and written materials. D/c with Sterapred Dosepak and zpak. Discussed return precautions and close pcp follow up.  Final Clinical Impressions(s) / ED Diagnoses   Final diagnoses:  Acute bronchitis, unspecified organism  Chronic obstructive pulmonary disease, unspecified COPD type [facility](HCC)    ED Discharge Orders    None       Arthor CaptainHarris, Lynnet Hefley, PA-C 04/08/18 0946    Ward, Layla MawKristen N, DO 04/08/18 1101

## 2018-04-15 ENCOUNTER — Emergency Department (HOSPITAL_COMMUNITY)
Admission: EM | Admit: 2018-04-15 | Discharge: 2018-04-15 | Disposition: A | Discharge status: Home / Self Care | Payer: Non-veteran care | Attending: Emergency Medicine | Admitting: Emergency Medicine

## 2018-04-15 ENCOUNTER — Emergency Department (HOSPITAL_COMMUNITY): Payer: Non-veteran care

## 2018-04-15 ENCOUNTER — Encounter (HOSPITAL_COMMUNITY): Payer: Self-pay | Admitting: Emergency Medicine

## 2018-04-15 ENCOUNTER — Other Ambulatory Visit: Payer: Self-pay

## 2018-04-15 DIAGNOSIS — J209 Acute bronchitis, unspecified: Secondary | ICD-10-CM | POA: Insufficient documentation

## 2018-04-15 DIAGNOSIS — R0602 Shortness of breath: Secondary | ICD-10-CM

## 2018-04-15 DIAGNOSIS — J40 Bronchitis, not specified as acute or chronic: Secondary | ICD-10-CM

## 2018-04-15 DIAGNOSIS — F17210 Nicotine dependence, cigarettes, uncomplicated: Secondary | ICD-10-CM | POA: Insufficient documentation

## 2018-04-15 DIAGNOSIS — Z79899 Other long term (current) drug therapy: Secondary | ICD-10-CM | POA: Diagnosis not present

## 2018-04-15 DIAGNOSIS — Z7982 Long term (current) use of aspirin: Secondary | ICD-10-CM | POA: Diagnosis not present

## 2018-04-15 DIAGNOSIS — I10 Essential (primary) hypertension: Secondary | ICD-10-CM | POA: Insufficient documentation

## 2018-04-15 DIAGNOSIS — J449 Chronic obstructive pulmonary disease, unspecified: Secondary | ICD-10-CM | POA: Insufficient documentation

## 2018-04-15 LAB — CBC
HCT: 49.2 % (ref 39.0–52.0)
HEMOGLOBIN: 16.2 g/dL (ref 13.0–17.0)
MCH: 29.5 pg (ref 26.0–34.0)
MCHC: 32.9 g/dL (ref 30.0–36.0)
MCV: 89.5 fL (ref 80.0–100.0)
PLATELETS: 451 10*3/uL — AB (ref 150–400)
RBC: 5.5 MIL/uL (ref 4.22–5.81)
RDW: 13.2 % (ref 11.5–15.5)
WBC: 16.9 10*3/uL — ABNORMAL HIGH (ref 4.0–10.5)
nRBC: 0 % (ref 0.0–0.2)

## 2018-04-15 LAB — BASIC METABOLIC PANEL
Anion gap: 13 (ref 5–15)
BUN: 23 mg/dL (ref 8–23)
CHLORIDE: 101 mmol/L (ref 98–111)
CO2: 18 mmol/L — ABNORMAL LOW (ref 22–32)
Calcium: 9 mg/dL (ref 8.9–10.3)
Creatinine, Ser: 1.12 mg/dL (ref 0.61–1.24)
GFR calc Af Amer: >60 mL/min (ref >60)
GFR calc non Af Amer: >60 mL/min (ref >60)
GLUCOSE: 154 mg/dL — AB (ref 70–99)
POTASSIUM: 4.9 mmol/L (ref 3.5–5.1)
Sodium: 132 mmol/L — ABNORMAL LOW (ref 135–145)

## 2018-04-15 LAB — TROPONIN I: Troponin I: <0.03 ng/mL (ref <0.03)

## 2018-04-15 MED ORDER — DOXYCYCLINE HYCLATE 100 MG PO TABS: 100.0000 mg | ORAL_TABLET | Freq: Two times a day (BID) | ORAL | 0 refills | Status: DC

## 2018-04-15 MED ORDER — IPRATROPIUM-ALBUTEROL 0.5-2.5 (3) MG/3ML IN SOLN
3.0000 mL | Freq: Once | RESPIRATORY_TRACT | Status: AC
  Administered 2018-04-15: 3 mL via RESPIRATORY_TRACT
  Filled 2018-04-15: qty 3

## 2018-04-15 MED ORDER — PREDNISONE 20 MG PO TABS: 40.0000 mg | ORAL_TABLET | Freq: Every day | ORAL | 0 refills | Status: AC

## 2018-04-15 MED ORDER — FUROSEMIDE 10 MG/ML IJ SOLN
40.0000 mg | Freq: Once | INTRAMUSCULAR | Status: AC
  Administered 2018-04-15: 40 mg via INTRAVENOUS
  Filled 2018-04-15: qty 4

## 2018-04-15 NOTE — ED Notes (Signed)
Pt states IV just fell out, pt is up and dressed

## 2018-04-15 NOTE — Discharge Instructions (Addendum)
Thank you for allowing us taking care [facility] at [facility]. You came in due to chest tightness and shortness of breath. Your vital sign and exam were stable. Chest X ray did not show pneumonia. We gave your nebulizer treatment, some diuretic and glad that feel better. We think your symptoms are due to bronchitis. Please make sure you use your inhaler at home. Also take the steroid and antibiotics that I prescribe for you as instructed. (Make sure to drink antibiotic with plenty of water and do not lay back for 10-20 minutes after taking the pill.) If your symptoms worsened, please return to emergency room. Thank you

## 2018-04-15 NOTE — ED Triage Notes (Signed)
Pt was seen here on 12/2 and diagnosed bronchitis and treated with Zpac but pt states that he is no better. Pt states he has been having a cough with congestion and sweating

## 2018-04-15 NOTE — ED Provider Notes (Signed)
[facility]ENC[facility] Provider Note   CSN: [id number] Arrival date & time: 04/15/18  1320     History   Chief Complaint No chief complaint on file.   HPI Dennis Passyhomas M Fodge is a 62 y.o. male.with PMHx significant for Asthma, COPD, HLD, HTN, presented with chest tightness and  Shortness of breath. He was seen in ED, 1 week ago for similar reason, discharged with Azithromycin and Prednisone. He mentions that he did not improve though. He reports dry cough and feeling cold. No sore throat. No fever or chills. The chest tightness is not associated with activity. Happens on and off particularly with breathing.  No leg swelling.     HPI  Past Medical History:  Diagnosis Date  . Asthma   . COPD (chronic obstructive pulmonary disease) (HCC)   . High cholesterol   . Hypertension   . Kidney stones   . Renal disorder     There are no active problems to display for this patient.   Past Surgical History:  Procedure Laterality Date  . lithrotripsy          Home Medications    Prior to Admission medications   Medication Sig Start Date End Date Taking? Authorizing Provider  albuterol (PROVENTIL HFA;VENTOLIN HFA) 108 (90 BASE) MCG/ACT inhaler Inhale 2 puffs into the lungs every 6 (six) hours as needed for wheezing or shortness of breath.     [provider]  ALPRAZolam Prudy Feeler(XANAX) 0.25 MG tablet Take 1 tablet (0.25 mg total) by mouth at bedtime as needed for anxiety. Patient not taking: Reported on 04/08/2018 10/21/17   Loren RacerYelverton, David, MD  aspirin EC 81 MG tablet Take 162 mg by mouth daily with breakfast.     [provider]  azithromycin (ZITHROMAX Z-PAK) 250 MG tablet 2 po day one, then 1 daily x 4 days 04/08/18   Arthor CaptainHarris, Abigail, PA-C  doxycycline (VIBRAMYCIN) 100 MG capsule Take 1 capsule (100 mg total) by mouth 2 (two) times daily. Patient not taking: Reported on 10/21/2017 10/17/17   Derwood KaplanNanavati, Ankit, MD  fish oil-omega-3 fatty acids 1000  MG capsule Take 1 g by mouth daily with breakfast.     [provider]  hydrOXYzine (ATARAX/VISTARIL) 25 MG tablet Take 1 tablet (25 mg total) by mouth every 8 (eight) hours as needed for anxiety. Patient not taking: Reported on 04/08/2018 10/30/17   Little, Ambrose Finlandachel Morgan, MD  ibuprofen (ADVIL,MOTRIN) 200 MG tablet Take 400-600 mg by mouth every 6 (six) hours as needed for moderate pain.     [provider]  lisinopril (PRINIVIL,ZESTRIL) 20 MG tablet Take 20 mg by mouth daily.     [provider]  predniSONE (STERAPRED UNI-PAK 21 TAB) 10 MG (21) TBPK tablet Use as directed 04/08/18   Arthor CaptainHarris, Abigail, PA-C  ranitidine (ZANTAC) 150 MG tablet Take 150 mg by mouth 2 (two) times daily.    [provider]  simvastatin (ZOCOR) 80 MG tablet Take 80 mg by mouth at bedtime.    [provider]    Family History Family History  Problem Relation Age of Onset  . Hypertension Mother   . Diabetes Mother     Social History Social History   Tobacco Use  . Smoking status: Current Every Day Smoker    Packs/day: 2.00    Types: Cigarettes  . Smokeless tobacco: Never Used  Substance Use Topics  . Alcohol use: No  . Drug use: No     Allergies  Penicillins and Bactrim [sulfamethoxazole-trimethoprim]   Review of Systems Review of Systems   Physical Exam Updated Vital Signs There were no vitals taken for this visit.  Physical Exam  Constitutional: He is oriented to person, place, and time. He appears well-developed.  HENT:  Head: Normocephalic and atraumatic.  Eyes: EOM are normal.  Cardiovascular: Normal rate and regular rhythm.  No murmur heard. Pulmonary/Chest: He has wheezes. He has rhonchi.  Abdominal: Soft. There is no tenderness.  Musculoskeletal:       Left lower leg: He exhibits no edema.  Neurological: He is alert and oriented to person, place, and time.  Psychiatric: He has a normal mood and affect. His behavior is normal.  Nursing  note and vitals reviewed.    ED Treatments / Results  Labs (all labs ordered are listed, but only abnormal results are displayed) Labs Reviewed - No data to display  EKG None  Radiology No results found.  Procedures Procedures (including critical care time)  Medications Ordered in ED Medications - No data to display   Initial Impression / Assessment and Plan / ED Course  I have reviewed the triage vital signs and the nursing notes.  Pertinent labs & imaging results that were available during my care of the patient were reviewed by me and considered in my medical decision making (see chart for details). Dennis Dominguez is a 62 y.o. male.with PMHx significant for Asthma, COPD, HLD, HTN, presented with chest tightness and  Shortness of breath, not responded to Azithromycin and Prednison.   Upper respiratory infection, vs pneumonia/COPD exacerbation, No evidence of volume overload as cause of SOB. Will evaluate for ACS how ever less likely to be cardiac ischemia considering duration and picture of chest tightness but will still evaluate for.   -CBC -BMP -I stat Trop -CXR -EKG -Duoneb and will reevaluate him -40 mg IV Lasix and re evalute him  Patient endorses significant improvement. No evidence of pneumonia on CXR. EKG with no acute ST-T changes.  Cbc with leukocytosis, likely due to steroid use. Encouraged him to use his inhaler, and decreased smoking. Discharge him with Doxy and Prednisone and ED return precautions. He verbalized understanding and agrees with plan.    Final Clinical Impressions(s) / ED Diagnoses   Final diagnoses:  None    ED Discharge Orders    None       Chevis Pretty, MD 04/15/18 1638    Gerhard Munch, MD 04/16/18 Paulo Fruit

## 2018-04-20 ENCOUNTER — Emergency Department (HOSPITAL_COMMUNITY)
Admission: EM | Admit: 2018-04-20 | Discharge: 2018-04-20 | Disposition: A | Discharge status: Home / Self Care | Payer: Self-pay | Attending: Emergency Medicine | Admitting: Emergency Medicine

## 2018-04-20 ENCOUNTER — Emergency Department (HOSPITAL_COMMUNITY): Payer: Self-pay

## 2018-04-20 ENCOUNTER — Encounter (HOSPITAL_COMMUNITY): Payer: Self-pay | Admitting: *Deleted

## 2018-04-20 ENCOUNTER — Other Ambulatory Visit: Payer: Self-pay

## 2018-04-20 DIAGNOSIS — R072 Precordial pain: Secondary | ICD-10-CM

## 2018-04-20 DIAGNOSIS — F17210 Nicotine dependence, cigarettes, uncomplicated: Secondary | ICD-10-CM | POA: Insufficient documentation

## 2018-04-20 DIAGNOSIS — Z79899 Other long term (current) drug therapy: Secondary | ICD-10-CM | POA: Insufficient documentation

## 2018-04-20 DIAGNOSIS — R05 Cough: Secondary | ICD-10-CM | POA: Insufficient documentation

## 2018-04-20 DIAGNOSIS — J069 Acute upper respiratory infection, unspecified: Secondary | ICD-10-CM

## 2018-04-20 LAB — CBC WITH DIFFERENTIAL/PLATELET
Abs Immature Granulocytes: 0.12 10*3/uL — ABNORMAL HIGH (ref 0.00–0.07)
Basophils Absolute: 0.1 10*3/uL (ref 0.0–0.1)
Basophils Relative: 1 %
Eosinophils Absolute: 0.1 10*3/uL (ref 0.0–0.5)
Eosinophils Relative: 0 %
HCT: 46.9 % (ref 39.0–52.0)
Hemoglobin: 15.5 g/dL (ref 13.0–17.0)
Immature Granulocytes: 1 %
Lymphocytes Relative: 14 %
Lymphs Abs: 1.8 10*3/uL (ref 0.7–4.0)
MCH: 30 pg (ref 26.0–34.0)
MCHC: 33 g/dL (ref 30.0–36.0)
MCV: 90.9 fL (ref 80.0–100.0)
MONO ABS: 0.5 10*3/uL (ref 0.1–1.0)
Monocytes Relative: 4 %
Neutro Abs: 10.5 10*3/uL — ABNORMAL HIGH (ref 1.7–7.7)
Neutrophils Relative %: 80 %
Platelets: 403 10*3/uL — ABNORMAL HIGH (ref 150–400)
RBC: 5.16 MIL/uL (ref 4.22–5.81)
RDW: 13.4 % (ref 11.5–15.5)
WBC: 13.1 10*3/uL — AB (ref 4.0–10.5)
nRBC: 0 % (ref 0.0–0.2)

## 2018-04-20 LAB — BASIC METABOLIC PANEL
Anion gap: 12 (ref 5–15)
BUN: 18 mg/dL (ref 8–23)
CO2: 19 mmol/L — ABNORMAL LOW (ref 22–32)
Calcium: 9.3 mg/dL (ref 8.9–10.3)
Chloride: 102 mmol/L (ref 98–111)
Creatinine, Ser: 0.91 mg/dL (ref 0.61–1.24)
GFR calc Af Amer: >60 mL/min (ref >60)
GFR calc non Af Amer: >60 mL/min (ref >60)
Glucose, Bld: 267 mg/dL — ABNORMAL HIGH (ref 70–99)
Potassium: 5 mmol/L (ref 3.5–5.1)
SODIUM: 133 mmol/L — AB (ref 135–145)

## 2018-04-20 LAB — I-STAT TROPONIN, ED: TROPONIN I, POC: 0 ng/mL (ref 0.00–0.08)

## 2018-04-20 LAB — TROPONIN I

## 2018-04-20 MED ORDER — SODIUM CHLORIDE (PF) 0.9 % IJ SOLN
INTRAMUSCULAR | Status: AC
  Filled 2018-04-20: qty 50

## 2018-04-20 MED ORDER — IOPAMIDOL (ISOVUE-370) INJECTION 76%
INTRAVENOUS | Status: AC
  Filled 2018-04-20: qty 100

## 2018-04-20 MED ORDER — ALBUTEROL SULFATE HFA 108 (90 BASE) MCG/ACT IN AERS
2.0000 | INHALATION_SPRAY | RESPIRATORY_TRACT | Status: DC
  Administered 2018-04-20: 2 via RESPIRATORY_TRACT
  Filled 2018-04-20: qty 6.7

## 2018-04-20 MED ORDER — IOPAMIDOL (ISOVUE-370) INJECTION 76%
100.0000 mL | Freq: Once | INTRAVENOUS | Status: AC | PRN
  Administered 2018-04-20: 100 mL via INTRAVENOUS

## 2018-04-20 MED ORDER — IPRATROPIUM-ALBUTEROL 0.5-2.5 (3) MG/3ML IN SOLN
3.0000 mL | Freq: Once | RESPIRATORY_TRACT | Status: AC
  Administered 2018-04-20: 3 mL via RESPIRATORY_TRACT
  Filled 2018-04-20: qty 3

## 2018-04-20 NOTE — ED Triage Notes (Signed)
Per EMS- patient ws seen 2 weeks ago and was diagnosed with Bronchitis and was given the Z-pack and then seen a few days later and was given doxycycline. patient still c/o a productive cough and not feeling well.

## 2018-04-20 NOTE — ED Triage Notes (Signed)
Pt states he has been on 2 antibiotics without improvement with Bronchitis

## 2018-04-20 NOTE — ED Provider Notes (Signed)
[facility]-EMERGENCY DEPT Provider Note   CSN: [id number] Arrival date & time: 04/20/18  [id number]     History   Chief Complaint Chief Complaint  Patient presents with  . Cough    HPI Dennis Dominguez is a 62 y.o. male with hx of HTN, asthma, COPD and current every day smoker who presents to the ED via EMS with cough and congestion. The symptoms started over 2 weeks ago. Patient has had Z-pak and doxycycline and CXR x 2. Patient reports he continues to have a productive cough. Patient reports that he lives alone and feels like he is going to die. He c/o chest pain that feels like an elephant sitting on his chest and shortness of breath. Patient states he took all of his antibiotics but he still has prednisone left.   HPI  Past Medical History:  Diagnosis Date  . Asthma   . COPD (chronic obstructive pulmonary disease) (HCC)   . High cholesterol   . Hypertension   . Kidney stones   . Renal disorder     There are no active problems to display for this patient.   Past Surgical History:  Procedure Laterality Date  . lithrotripsy          Home Medications    Prior to Admission medications   Medication Sig Start Date End Date Taking? Authorizing Provider  albuterol (PROVENTIL HFA;VENTOLIN HFA) 108 (90 BASE) MCG/ACT inhaler Inhale 2 puffs into the lungs every 6 (six) hours as needed for wheezing or shortness of breath.     [provider]  ALPRAZolam Prudy Feeler(XANAX) 0.25 MG tablet Take 1 tablet (0.25 mg total) by mouth at bedtime as needed for anxiety. Patient not taking: Reported on 04/08/2018 10/21/17   Loren RacerYelverton, David, MD  aspirin EC 81 MG tablet Take 162 mg by mouth daily with breakfast.     [provider]  azithromycin (ZITHROMAX Z-PAK) 250 MG tablet 2 po day one, then 1 daily x 4 days Patient not taking: Reported on 04/15/2018 04/08/18   Arthor CaptainHarris, Abigail, PA-C  doxycycline (VIBRA-TABS) 100 MG tablet Take 1 tablet (100 mg total) by mouth 2  (two) times daily. 04/15/18   Masoudi, Shawna OrleansElhamalsadat, MD  fish oil-omega-3 fatty acids 1000 MG capsule Take 1 g by mouth daily with breakfast.     [provider]  ibuprofen (ADVIL,MOTRIN) 200 MG tablet Take 400-600 mg by mouth every 6 (six) hours as needed for moderate pain.     [provider]  lisinopril (PRINIVIL,ZESTRIL) 20 MG tablet Take 20 mg by mouth daily.     [provider]  simvastatin (ZOCOR) 80 MG tablet Take 80 mg by mouth daily.    [provider]    Family History Family History  Problem Relation Age of Onset  . Hypertension Mother   . Diabetes Mother     Social History Social History   Tobacco Use  . Smoking status: Current Every Day Smoker    Packs/day: 2.00    Types: Cigarettes  . Smokeless tobacco: Never Used  Substance Use Topics  . Alcohol use: No  . Drug use: No     Allergies   Penicillins and Bactrim [sulfamethoxazole-trimethoprim]   Review of Systems Review of Systems  Constitutional: Positive for chills. Negative for fever.  HENT: Positive for congestion. Negative for ear pain, sore throat and trouble swallowing.   Eyes: Negative for visual disturbance.  Respiratory: Positive for chest tightness, shortness of breath and wheezing.   Cardiovascular:  Positive for chest pain.  Gastrointestinal: Negative for abdominal pain, nausea and vomiting.  Musculoskeletal: Positive for myalgias.  Skin: Negative for rash.  Neurological: Negative for headaches.  Psychiatric/Behavioral: Negative for confusion.     Physical Exam Updated Vital Signs BP 127/82   Pulse 80   Temp 98.1 F (36.7 C) (Oral)   Resp 20   Ht 5\' 9"  (1.753 m)   Wt 108.8 kg   SpO2 96%   BMI 35.42 kg/m   Physical Exam Vitals signs and nursing note reviewed.  Constitutional:      Appearance: He is well-developed.  HENT:     Mouth/Throat:     Mouth: Mucous membranes are moist.  Eyes:     Extraocular Movements: Extraocular movements intact.    Neck:     Musculoskeletal: Neck supple.  Cardiovascular:     Rate and Rhythm: Normal rate and regular rhythm.  Pulmonary:     Breath sounds: Decreased air movement present. No wheezing or rales.  Abdominal:     Palpations: Abdomen is soft.     Tenderness: There is no abdominal tenderness.  Musculoskeletal: Normal range of motion.  Skin:    General: Skin is warm and dry.  Neurological:     General: No focal deficit present.     Mental Status: He is alert.      ED Treatments / Results  Labs (all labs ordered are listed, but only abnormal results are displayed) Labs Reviewed  CBC WITH DIFFERENTIAL/PLATELET - Abnormal; Notable for the following components:      Result Value   WBC 13.1 (*)    Platelets 403 (*)    Neutro Abs 10.5 (*)    Abs Immature Granulocytes 0.12 (*)    All other components within normal limits  BASIC METABOLIC PANEL - Abnormal; Notable for the following components:   Sodium 133 (*)    CO2 19 (*)    Glucose, Bld 267 (*)    All other components within normal limits  TROPONIN I    EKG None  Radiology No results found.  Procedures Procedures (including critical care time)  Medications Ordered in ED Medications  ipratropium-albuterol (DUONEB) 0.5-2.5 (3) MG/3ML nebulizer solution 3 mL (3 mLs Nebulization Given 04/20/18 1232)   1:00 pm after breathing treatment patient reports feeling like he can breathe better. Re examined, lungs clear, good air movement.    Initial Impression / Assessment and Plan / ED Course  I have reviewed the triage vital signs and the nursing notes.   Final Clinical Impressions(s) / ED Diagnoses  Dr. Patria Mane in to see the patient and will continue patient's care in the main ED for cardiac evaluation.   ED Discharge Orders    None       Kerrie Buffalo [place], [place] 04/20/18 1356    Azalia Bilis, MD 04/21/18 [phone]

## 2018-04-20 NOTE — ED Notes (Signed)
Bed: WTR6 Expected date:  Expected time:  Means of arrival:  Comments: 

## 2018-04-20 NOTE — ED Notes (Signed)
Bed: WTR8 Expected date:  Expected time:  Means of arrival:  Comments: 

## 2018-04-20 NOTE — ED Notes (Signed)
Pt on cardiac  Monitoring

## 2019-09-15 DIAGNOSIS — E559 Vitamin D deficiency, unspecified: Secondary | ICD-10-CM | POA: Insufficient documentation

## 2019-09-15 DIAGNOSIS — E119 Type 2 diabetes mellitus without complications: Secondary | ICD-10-CM | POA: Insufficient documentation

## 2020-08-15 ENCOUNTER — Encounter (HOSPITAL_COMMUNITY): Payer: Self-pay

## 2020-08-15 ENCOUNTER — Emergency Department (HOSPITAL_COMMUNITY)
Admission: EM | Admit: 2020-08-15 | Discharge: 2020-08-15 | Disposition: A | Discharge status: Home / Self Care | Payer: No Typology Code available for payment source | Attending: Emergency Medicine | Admitting: Emergency Medicine

## 2020-08-15 ENCOUNTER — Emergency Department (HOSPITAL_COMMUNITY): Payer: No Typology Code available for payment source

## 2020-08-15 ENCOUNTER — Other Ambulatory Visit: Payer: Self-pay

## 2020-08-15 DIAGNOSIS — J45909 Unspecified asthma, uncomplicated: Secondary | ICD-10-CM | POA: Diagnosis not present

## 2020-08-15 DIAGNOSIS — Z7982 Long term (current) use of aspirin: Secondary | ICD-10-CM | POA: Insufficient documentation

## 2020-08-15 DIAGNOSIS — J069 Acute upper respiratory infection, unspecified: Secondary | ICD-10-CM

## 2020-08-15 DIAGNOSIS — J441 Chronic obstructive pulmonary disease with (acute) exacerbation: Secondary | ICD-10-CM

## 2020-08-15 DIAGNOSIS — R059 Cough, unspecified: Secondary | ICD-10-CM | POA: Diagnosis present

## 2020-08-15 DIAGNOSIS — Z20822 Contact with and (suspected) exposure to covid-19: Secondary | ICD-10-CM | POA: Insufficient documentation

## 2020-08-15 DIAGNOSIS — I10 Essential (primary) hypertension: Secondary | ICD-10-CM | POA: Diagnosis not present

## 2020-08-15 DIAGNOSIS — Z79899 Other long term (current) drug therapy: Secondary | ICD-10-CM | POA: Insufficient documentation

## 2020-08-15 DIAGNOSIS — F17210 Nicotine dependence, cigarettes, uncomplicated: Secondary | ICD-10-CM | POA: Insufficient documentation

## 2020-08-15 LAB — RESP PANEL BY RT-PCR (FLU A&B, COVID) ARPGX2
Influenza A by PCR: NEGATIVE
Influenza B by PCR: NEGATIVE
SARS Coronavirus 2 by RT PCR: NEGATIVE

## 2020-08-15 MED ORDER — DOXYCYCLINE HYCLATE 100 MG PO CAPS: 100.0000 mg | ORAL_CAPSULE | Freq: Two times a day (BID) | ORAL | 0 refills | Status: DC

## 2020-08-15 MED ORDER — PREDNISONE 10 MG PO TABS: 50.0000 mg | ORAL_TABLET | Freq: Every day | ORAL | 0 refills | Status: AC

## 2020-08-15 MED ORDER — IPRATROPIUM-ALBUTEROL 0.5-2.5 (3) MG/3ML IN SOLN
3.0000 mL | Freq: Once | RESPIRATORY_TRACT | Status: AC
  Administered 2020-08-15: 3 mL via RESPIRATORY_TRACT
  Filled 2020-08-15: qty 3

## 2020-08-15 MED ORDER — PREDNISONE 20 MG PO TABS
60.0000 mg | ORAL_TABLET | Freq: Once | ORAL | Status: AC
  Administered 2020-08-15: 60 mg via ORAL
  Filled 2020-08-15: qty 3

## 2020-08-15 MED ORDER — COMBIVENT RESPIMAT 20-100 MCG/ACT IN AERS: 1.0000 | INHALATION_SPRAY | Freq: Four times a day (QID) | RESPIRATORY_TRACT | 0 refills | Status: DC

## 2020-08-15 NOTE — ED Triage Notes (Signed)
PT thinks he has bronchitis.

## 2020-08-15 NOTE — Discharge Instructions (Addendum)
Take prednisone as prescribed and complete the full course. Take doxycycline as prescribed and complete the full course. New inhaler today, Combivent, this is the same medication that was in your breathing treatment today.  You may continue to use your regular albuterol inhaler in between if needed.

## 2020-08-15 NOTE — ED Provider Notes (Signed)
[facility] EMERGENCY DEPARTMENT Provider Note   CSN: [id number] Arrival date & time: 08/15/20  7829     History Chief Complaint  Patient presents with  . Shortness of Breath  . Cough    Dennis Dominguez is a 65 y.o. male.  65 year old male with history of COPD, asthma, hypertension, hyperlipidemia presents with complaint of cough, congestion, sneezing x5 days.  Patient has been taking Mucinex and feels like this has given him some relief however woke up today feeling worse and decided to come to the emergency room.  Patient has not butyryl inhaler which she has been using more than usual with limited relief.  States that he feels like he may have had a fever recently with chills.  No known sick contacts.  Patient is fully vaccinated against COVID-19.  Patient is a daily smoker.  No other complaints or concerns.  Dennis Dominguez was evaluated in Emergency Department on 08/15/2020 for the symptoms described in the history of present illness. He was evaluated in the context of the global COVID-19 pandemic, which necessitated consideration that the patient might be at risk for infection with the SARS-CoV-2 virus that causes COVID-19. Institutional protocols and algorithms that pertain to the evaluation of patients at risk for COVID-19 are in a state of rapid change based on information released by regulatory bodies including the CDC and federal and state organizations. These policies and algorithms were followed during the patient's care in the ED.         Past Medical History:  Diagnosis Date  . Asthma   . COPD (chronic obstructive pulmonary disease) (HCC)   . High cholesterol   . Hypertension   . Kidney stones   . Renal disorder     There are no problems to display for this patient.   Past Surgical History:  Procedure Laterality Date  . lithrotripsy         Family History  Problem Relation Age of Onset  . Hypertension Mother   . Diabetes Mother      Social History   Tobacco Use  . Smoking status: Current Every Day Smoker    Packs/day: 2.00    Types: Cigarettes  . Smokeless tobacco: Never Used  Vaping Use  . Vaping Use: Never used  Substance Use Topics  . Alcohol use: No  . Drug use: No    Home Medications Prior to Admission medications   Medication Sig Start Date End Date Taking? Authorizing Provider  doxycycline (VIBRAMYCIN) 100 MG capsule Take 1 capsule (100 mg total) by mouth 2 (two) times daily. 08/15/20  Yes Jeannie Fend, PA-C  Ipratropium-Albuterol (COMBIVENT RESPIMAT) 20-100 MCG/ACT AERS respimat Inhale 1 puff into the lungs every 6 (six) hours. 08/15/20  Yes Jeannie Fend, PA-C  predniSONE (DELTASONE) 10 MG tablet Take 5 tablets (50 mg total) by mouth daily for 4 days. 08/15/20 08/19/20 Yes Jeannie Fend, PA-C  albuterol (PROVENTIL HFA;VENTOLIN HFA) 108 (90 BASE) MCG/ACT inhaler Inhale 2 puffs into the lungs every 6 (six) hours as needed for wheezing or shortness of breath.     [provider]  ALPRAZolam Prudy Feeler) 0.25 MG tablet Take 1 tablet (0.25 mg total) by mouth at bedtime as needed for anxiety. Patient not taking: Reported on 04/08/2018 10/21/17   Loren Racer, MD  aspirin EC 81 MG tablet Take 162 mg by mouth daily with breakfast.     [provider]  fish oil-omega-3 fatty acids 1000 MG capsule Take 1  g by mouth daily with breakfast.     [provider]  ibuprofen (ADVIL,MOTRIN) 200 MG tablet Take 400-600 mg by mouth every 6 (six) hours as needed for moderate pain.     [provider]  lisinopril (PRINIVIL,ZESTRIL) 20 MG tablet Take 20 mg by mouth daily.     [provider]  simvastatin (ZOCOR) 80 MG tablet Take 80 mg by mouth daily.    [provider]    Allergies    Penicillins and Bactrim [sulfamethoxazole-trimethoprim]  Review of Systems   Review of Systems  Constitutional: Positive for chills. Negative for fever.  HENT: Positive for congestion  and sneezing. Negative for sore throat.   Respiratory: Positive for cough, shortness of breath and wheezing.   Cardiovascular: Negative for chest pain and leg swelling.  Gastrointestinal: Negative for abdominal pain.  Musculoskeletal: Negative for arthralgias and myalgias.  Skin: Negative for rash and wound.  Allergic/Immunologic: Negative for immunocompromised state.  Neurological: Negative for weakness.  Hematological: Negative for adenopathy.  All other systems reviewed and are negative.   Physical Exam Updated Vital Signs BP (!) 139/96 (BP Location: Right Arm)   Pulse 73   Temp 98.4 F (36.9 C) (Oral)   Resp (!) 22   SpO2 98%   Physical Exam Vitals and nursing note reviewed.  Constitutional:      General: He is not in acute distress.    Appearance: He is well-developed. He is not diaphoretic.  HENT:     Head: Normocephalic and atraumatic.     Mouth/Throat:     Mouth: Mucous membranes are moist.     Pharynx: Oropharynx is clear. No pharyngeal swelling or oropharyngeal exudate.  Cardiovascular:     Rate and Rhythm: Normal rate and regular rhythm.     Heart sounds: No murmur heard.   Pulmonary:     Effort: Pulmonary effort is normal.     Breath sounds: Wheezing and rhonchi present.  Musculoskeletal:     Cervical back: Neck supple.     Right lower leg: No edema.     Left lower leg: No edema.  Lymphadenopathy:     Cervical: No cervical adenopathy.  Skin:    General: Skin is warm and dry.     Findings: No erythema or rash.  Neurological:     Mental Status: He is alert and oriented to person, place, and time.  Psychiatric:        Behavior: Behavior normal.     ED Results / Procedures / Treatments   Labs (all labs ordered are listed, but only abnormal results are displayed) Labs Reviewed  RESP PANEL BY RT-PCR (FLU A&B, COVID) ARPGX2    EKG None  Radiology DG Chest Port 1 View  Result Date: 08/15/2020 CLINICAL DATA:  Cough and shortness of breath EXAM:  PORTABLE CHEST 1 VIEW COMPARISON:  April 20, 2018 chest radiograph and chest CT FINDINGS: The lungs are clear. Heart is upper normal in size, stable, with pulmonary vascularity within normal limits. No adenopathy. No bone lesions. IMPRESSION: Lungs clear.  Heart upper normal in size. Electronically Signed   By: Bretta Bang III M.D.   On: 08/15/2020 09:36    Procedures Procedures   Medications Ordered in ED Medications  predniSONE (DELTASONE) tablet 60 mg (60 mg Oral Given 08/15/20 0859)  ipratropium-albuterol (DUONEB) 0.5-2.5 (3) MG/3ML nebulizer solution 3 mL (3 mLs Nebulization Given 08/15/20 0859)    ED Course  I have reviewed the triage vital signs and the nursing  notes.  Pertinent labs & imaging results that were available during my care of the patient were reviewed by me and considered in my medical decision making (see chart for details).  Clinical Course as of 08/15/20 0955  Sun Aug 15, 2020  3354 65 year old male with history of COPD with likely viral URI x5 days, feels like he is worsening today.  On exam has wheezing and rhonchi throughout.  Patient completed DuoNeb treatment with some relief in his symptoms, was given dose of prednisone today. Maintaining O2 sats 98% on room air, maintains well ambulating above 90%. Covid and flu negative, chest x-ray negative for acute infection.  Plan is to discharge with prednisone, doxycycline, prescription for Combivent inhaler with plan to follow-up with PCP.  Advised return to ED for worsening or concerning symptoms.  [LM]    Clinical Course User Index [LM] Alden Hipp   MDM Rules/Calculators/A&P                          Final Clinical Impression(s) / ED Diagnoses Final diagnoses:  COPD exacerbation (HCC)  Viral URI with cough    Rx / DC Orders ED Discharge Orders         Ordered    doxycycline (VIBRAMYCIN) 100 MG capsule  2 times daily        08/15/20 0953    predniSONE (DELTASONE) 10 MG tablet  Daily         08/15/20 0953    Ipratropium-Albuterol (COMBIVENT RESPIMAT) 20-100 MCG/ACT AERS respimat  Every 6 hours        08/15/20 0953           Jeannie Fend, PA-C 08/15/20 0955    Melene Plan, DO 08/15/20 1359

## 2022-02-04 ENCOUNTER — Encounter (HOSPITAL_COMMUNITY): Payer: Self-pay

## 2022-02-04 ENCOUNTER — Emergency Department (HOSPITAL_COMMUNITY): Payer: No Typology Code available for payment source

## 2022-02-04 ENCOUNTER — Other Ambulatory Visit: Payer: Self-pay

## 2022-02-04 ENCOUNTER — Emergency Department (HOSPITAL_COMMUNITY)
Admission: EM | Admit: 2022-02-04 | Discharge: 2022-02-04 | Disposition: A | Discharge status: Home / Self Care | Payer: No Typology Code available for payment source | Attending: Emergency Medicine | Admitting: Emergency Medicine

## 2022-02-04 DIAGNOSIS — J441 Chronic obstructive pulmonary disease with (acute) exacerbation: Secondary | ICD-10-CM

## 2022-02-04 DIAGNOSIS — Z7982 Long term (current) use of aspirin: Secondary | ICD-10-CM | POA: Diagnosis not present

## 2022-02-04 DIAGNOSIS — J45909 Unspecified asthma, uncomplicated: Secondary | ICD-10-CM | POA: Diagnosis not present

## 2022-02-04 DIAGNOSIS — R059 Cough, unspecified: Secondary | ICD-10-CM | POA: Diagnosis present

## 2022-02-04 DIAGNOSIS — Z7951 Long term (current) use of inhaled steroids: Secondary | ICD-10-CM | POA: Insufficient documentation

## 2022-02-04 DIAGNOSIS — I10 Essential (primary) hypertension: Secondary | ICD-10-CM | POA: Insufficient documentation

## 2022-02-04 DIAGNOSIS — Z79899 Other long term (current) drug therapy: Secondary | ICD-10-CM | POA: Insufficient documentation

## 2022-02-04 DIAGNOSIS — Z20822 Contact with and (suspected) exposure to covid-19: Secondary | ICD-10-CM | POA: Insufficient documentation

## 2022-02-04 LAB — COMPREHENSIVE METABOLIC PANEL
ALT: 18 U/L (ref 0–44)
AST: 18 U/L (ref 15–41)
Albumin: 3.8 g/dL (ref 3.5–5.0)
Alkaline Phosphatase: 45 U/L (ref 38–126)
Anion gap: 8 (ref 5–15)
BUN: 9 mg/dL (ref 8–23)
CO2: 21 mmol/L — ABNORMAL LOW (ref 22–32)
Calcium: 9.1 mg/dL (ref 8.9–10.3)
Chloride: 104 mmol/L (ref 98–111)
Creatinine, Ser: 0.97 mg/dL (ref 0.61–1.24)
GFR, Estimated: >60 mL/min (ref >60)
Glucose, Bld: 140 mg/dL — ABNORMAL HIGH (ref 70–99)
Potassium: 4.4 mmol/L (ref 3.5–5.1)
Sodium: 133 mmol/L — ABNORMAL LOW (ref 135–145)
Total Bilirubin: 0.8 mg/dL (ref 0.3–1.2)
Total Protein: 7.5 g/dL (ref 6.5–8.1)

## 2022-02-04 LAB — CBC WITH DIFFERENTIAL/PLATELET
Abs Immature Granulocytes: 0.03 10*3/uL (ref 0.00–0.07)
Basophils Absolute: 0.1 10*3/uL (ref 0.0–0.1)
Basophils Relative: 1 %
Eosinophils Absolute: 0.5 10*3/uL (ref 0.0–0.5)
Eosinophils Relative: 5 %
HCT: 47.1 % (ref 39.0–52.0)
Hemoglobin: 15.3 g/dL (ref 13.0–17.0)
Immature Granulocytes: 0 %
Lymphocytes Relative: 23 %
Lymphs Abs: 2.5 10*3/uL (ref 0.7–4.0)
MCH: 28.2 pg (ref 26.0–34.0)
MCHC: 32.5 g/dL (ref 30.0–36.0)
MCV: 86.9 fL (ref 80.0–100.0)
Monocytes Absolute: 1.1 10*3/uL — ABNORMAL HIGH (ref 0.1–1.0)
Monocytes Relative: 10 %
Neutro Abs: 6.8 10*3/uL (ref 1.7–7.7)
Neutrophils Relative %: 61 %
Platelets: 409 10*3/uL — ABNORMAL HIGH (ref 150–400)
RBC: 5.42 MIL/uL (ref 4.22–5.81)
RDW: 14.4 % (ref 11.5–15.5)
WBC: 11.1 10*3/uL — ABNORMAL HIGH (ref 4.0–10.5)
nRBC: 0 % (ref 0.0–0.2)

## 2022-02-04 LAB — RESP PANEL BY RT-PCR (FLU A&B, COVID) ARPGX2
Influenza A by PCR: NEGATIVE
Influenza B by PCR: NEGATIVE
SARS Coronavirus 2 by RT PCR: NEGATIVE

## 2022-02-04 MED ORDER — BENZONATATE 100 MG PO CAPS: 100.0000 mg | ORAL_CAPSULE | Freq: Three times a day (TID) | ORAL | 0 refills | Status: DC | PRN

## 2022-02-04 MED ORDER — PREDNISONE 20 MG PO TABS: ORAL_TABLET | ORAL | 0 refills | Status: DC

## 2022-02-04 MED ORDER — IPRATROPIUM-ALBUTEROL 0.5-2.5 (3) MG/3ML IN SOLN
3.0000 mL | Freq: Once | RESPIRATORY_TRACT | Status: AC
  Administered 2022-02-04: 3 mL via RESPIRATORY_TRACT
  Filled 2022-02-04: qty 3

## 2022-02-04 NOTE — ED Triage Notes (Signed)
Patient complains of cough and congestion x 2 week. Has used otc meds with no relief

## 2022-02-04 NOTE — Discharge Instructions (Addendum)
You have been evaluated for your symptoms.  Fortunately x-ray of your chest did not show any signs of pneumonia.  Your COVID and flu test came back negative.  Your labs are reassuring.  Your symptoms likely due to a COPD exacerbation likely triggered by some viral infection.  Please take prednisone as prescribed for the next several days.  You may take Tessalon Perles as needed for cough.  You may take over-the-counter Tylenol as needed for headache.  Follow-up with your doctor for further care.  Return if you have any concern.

## 2022-02-04 NOTE — ED Provider Triage Note (Signed)
Emergency Medicine Provider Triage Evaluation Note  Dennis Dominguez , a 66 y.o. male  was evaluated in triage.  Pt complains of chest congestion, head congestion, progressively worsening over the past 2 weeks, no relief with numerous OTC medications.  Is a daily smoker, has been using his nebulizer treatments at home with limited relief.  Cough initially productive, now non productive.  Denies fevers or chills  Review of Systems  Positive: As above Negative: As above  Physical Exam  BP (!) 139/94   Pulse 86   Temp 98.6 F (37 C) (Oral)   Resp (!) 22   SpO2 96%  Gen:   Awake, no distress   Resp:  Normal effort  MSK:   Moves extremities without difficulty  Other:  Mild wheezing  Medical Decision Making  Medically screening exam initiated at 9:19 AM.  Appropriate orders placed.  CATALINO PLASCENCIA was informed that the remainder of the evaluation will be completed by another provider, this initial triage assessment does not replace that evaluation, and the importance of remaining in the ED until their evaluation is complete.     Tacy Learn, PA-C 02/04/22 [phone]

## 2022-02-04 NOTE — ED Provider Notes (Signed)
[facility] EMERGENCY DEPARTMENT Provider Note   CSN: [id number] Arrival date & time: 02/04/22  3329     History  No chief complaint on file.   Dennis Dominguez is a 66 y.o. male.  The history is provided by the patient and medical records. No language interpreter was used.     Dennis Dominguez is a 66yo male with PMHx of asthma, COPD, HTN, high cholesterol, current 1ppd smoker who present to the ED with complaints of cough and chest pain. He states that the pain started 14 days ago, when he developed URI symptoms. He states after he got sick, he had a prod cough, for which he took OTC cold remedy. Since that time, he is no longer producing sputum, but continues to have a cough and chest pain for the past 3 days. The pain is a 5/10. It does not radiate to his jaw or arm. He denies diaphoresis, N/V, abdominal pain, fever chills. He has not found any relieving factors.  Home Medications Prior to Admission medications   Medication Sig Start Date End Date Taking? Authorizing Provider  albuterol (PROVENTIL HFA;VENTOLIN HFA) 108 (90 BASE) MCG/ACT inhaler Inhale 2 puffs into the lungs every 6 (six) hours as needed for wheezing or shortness of breath.     [provider]  ALPRAZolam Prudy Feeler) 0.25 MG tablet Take 1 tablet (0.25 mg total) by mouth at bedtime as needed for anxiety. Patient not taking: Reported on 04/08/2018 10/21/17   Loren Racer, MD  aspirin EC 81 MG tablet Take 162 mg by mouth daily with breakfast.     [provider]  doxycycline (VIBRAMYCIN) 100 MG capsule Take 1 capsule (100 mg total) by mouth 2 (two) times daily. 08/15/20   Jeannie Fend, PA-C  fish oil-omega-3 fatty acids 1000 MG capsule Take 1 g by mouth daily with breakfast.     [provider]  ibuprofen (ADVIL,MOTRIN) 200 MG tablet Take 400-600 mg by mouth every 6 (six) hours as needed for moderate pain.     [provider]  Ipratropium-Albuterol (COMBIVENT  RESPIMAT) 20-100 MCG/ACT AERS respimat Inhale 1 puff into the lungs every 6 (six) hours. 08/15/20   Jeannie Fend, PA-C  lisinopril (PRINIVIL,ZESTRIL) 20 MG tablet Take 20 mg by mouth daily.     [provider]  simvastatin (ZOCOR) 80 MG tablet Take 80 mg by mouth daily.    [provider]      Allergies    Penicillins and Bactrim [sulfamethoxazole-trimethoprim]    Review of Systems   Review of Systems  All other systems reviewed and are negative.   Physical Exam Updated Vital Signs BP (!) 138/92   Pulse 84   Temp 98.6 F (37 C) (Oral)   Resp 18   SpO2 98%  Physical Exam Vitals and nursing note reviewed.  Constitutional:      General: He is not in acute distress.    Appearance: He is well-developed.  HENT:     Head: Atraumatic.     Mouth/Throat:     Mouth: Mucous membranes are moist.  Eyes:     Conjunctiva/sclera: Conjunctivae normal.  Neck:     Vascular: No carotid bruit.  Cardiovascular:     Rate and Rhythm: Normal rate and regular rhythm.     Pulses: Normal pulses.     Heart sounds: Normal heart sounds.  Pulmonary:     Breath sounds: Wheezing (faint expiratory wheezes but otherwise clear lungs) present.  Abdominal:  Tenderness: There is no abdominal tenderness.  Musculoskeletal:     Cervical back: Normal range of motion and neck supple. No rigidity or tenderness.     Right lower leg: No edema.     Left lower leg: No edema.  Skin:    Findings: No rash.  Neurological:     Mental Status: He is alert. Mental status is at baseline.  Psychiatric:        Mood and Affect: Mood normal.     ED Results / Procedures / Treatments   Labs (all labs ordered are listed, but only abnormal results are displayed) Labs Reviewed  COMPREHENSIVE METABOLIC PANEL - Abnormal; Notable for the following components:      Result Value   Sodium 133 (*)    CO2 21 (*)    Glucose, Bld 140 (*)    All other components within normal limits  CBC WITH  DIFFERENTIAL/PLATELET - Abnormal; Notable for the following components:   WBC 11.1 (*)    Platelets 409 (*)    Monocytes Absolute 1.1 (*)    All other components within normal limits  RESP PANEL BY RT-PCR (FLU A&B, COVID) ARPGX2    EKG None ED ECG REPORT   Date: 02/04/2022  Rate: 86  Rhythm: normal sinus rhythm  QRS Axis: left  Intervals: normal  ST/T Wave abnormalities: normal  Conduction Disutrbances:none  Narrative Interpretation:   Old EKG Reviewed: unchanged  I have personally reviewed the EKG tracing and agree with the computerized printout as noted.   Radiology DG Chest 2 View  Result Date: 02/04/2022 CLINICAL DATA:  Cough. EXAM: CHEST - 2 VIEW COMPARISON:  08/15/2020. FINDINGS: Cardiac silhouette is normal in size. No mediastinal or hilar masses. No evidence of adenopathy. Prominent bronchovascular markings.  Lungs otherwise clear. No pleural effusion or pneumothorax. Skeletal structures are intact. IMPRESSION: No active cardiopulmonary disease. Electronically Signed   By: Lajean Manes M.D.   On: 02/04/2022 10:03    Procedures Procedures    Medications Ordered in ED Medications  ipratropium-albuterol (DUONEB) 0.5-2.5 (3) MG/3ML nebulizer solution 3 mL (3 mLs Nebulization Given 02/04/22 1431)    ED Course/ Medical Decision Making/ A&P                           Medical Decision Making  BP (!) 138/92   Pulse 84   Temp 98.6 F (37 C) (Oral)   Resp 18   SpO2 98%   77:58 PM  66 year old male significant history of asthma, COPD, presenting complaining of chest discomfort.  Patient endorse for the past 2 weeks he has had productive cough, increased wheezing, shortness of breath, and overall not feeling well.  Cough became nonproductive in the past 3 days which concerned him.  He is using his rescue inhaler twice daily but have been using more than usual.  Does not endorse any fever no hemoptysis no dizziness lightheadedness nausea or diaphoresis.  He tried  over-the-counter medication without relief.  He reports he has had the full COVID vaccination but concerns for infection.  No prior history of PE or DVT and no leg swelling or calf pain.    On exam this is an elderly male sitting upright appears to be in no acute discomfort.  He just recently finished a course of DuoNeb.  Lung exam with minimal expiratory wheezes but otherwise clear.  Heart is normal.  Abdomen soft nontender.  No peripheral edema no calf tenderness.  Patient is  afebrile, vital signs stable.  Labs, EKG, and imaging obtained independently reviewed interpreted by me and I agree with radiology interpretation.  EKG without concerning arrhythmia or ischemic changes.  Electrolyte panels are reassuring.  Minimally elevated white count of 11.1.  COVID and flu test came back negative.  Chest x-ray showed no evidence of active cardiopulmonary disease.  I felt patient's presentation is suggestive of COPD exacerbations due to likely a viral illness.  Patient is not hypoxic, work-up otherwise unremarkable and I felt patient is stable to be discharged home and they have been admitted.  I have low suspicion for PE or CHF.  I gave patient return precautions.  He will also follow-up with his provider as outpatient for further care.  Patient discharged home with prednisone, and cough medication. Care discussed with Dr. Alvino Chapel.    This patient presents to the ED for concern of cough, this involves an extensive number of treatment options, and is a complaint that carries with it a high risk of complications and morbidity.  The differential diagnosis includes pneumonia, covid, viral illness, chf, copd, asthma, pe, ptx  Co morbidities that complicate the patient evaluation asthma  copd Additional history obtained:  Additional history obtained from patient External records from outside source obtained and reviewed including EMR  Lab Tests:  I Ordered, and personally interpreted labs.  The pertinent  results include:  as above  Imaging Studies ordered:  I ordered imaging studies including CXR I independently visualized and interpreted imaging which showed normal finding I agree with the radiologist interpretation  Cardiac Monitoring:  The patient was maintained on a cardiac monitor.  I personally viewed and interpreted the cardiac monitored which showed an underlying rhythm of: NSR  Medicines ordered and prescription drug management:  I ordered medication including duonebs  for wheezes Reevaluation of the patient after these medicines showed that the patient improved I have reviewed the patients home medicines and have made adjustments as needed  Test Considered: as above  Critical Interventions: duonebs   Problem List / ED Course: cough  sob  Reevaluation:  After the interventions noted above, I reevaluated the patient and found that they have :improved  Social Determinants of Health: tobacco use  Dispostion:  After consideration of the diagnostic results and the patients response to treatment, I feel that the patent would benefit from outpt f/u.         Final Clinical Impression(s) / ED Diagnoses Final diagnoses:  COPD exacerbation ([place])    Rx / DC Orders ED Discharge Orders          Ordered    predniSONE (DELTASONE) 20 MG tablet        02/04/22 1459    benzonatate (TESSALON) 100 MG capsule  3 times daily PRN        02/04/22 1459              Domenic Moras, PA-C 02/04/22 1459    Davonna Belling, MD 02/04/22 1523

## 2022-12-10 ENCOUNTER — Emergency Department (HOSPITAL_COMMUNITY)
Admission: EM | Admit: 2022-12-10 | Discharge: 2022-12-10 | Disposition: A | Discharge status: Home / Self Care | Payer: No Typology Code available for payment source | Attending: Emergency Medicine | Admitting: Emergency Medicine

## 2022-12-10 ENCOUNTER — Other Ambulatory Visit: Payer: Self-pay

## 2022-12-10 DIAGNOSIS — I10 Essential (primary) hypertension: Secondary | ICD-10-CM | POA: Diagnosis not present

## 2022-12-10 DIAGNOSIS — Z7982 Long term (current) use of aspirin: Secondary | ICD-10-CM | POA: Insufficient documentation

## 2022-12-10 DIAGNOSIS — Z79899 Other long term (current) drug therapy: Secondary | ICD-10-CM | POA: Diagnosis not present

## 2022-12-10 DIAGNOSIS — M25519 Pain in unspecified shoulder: Secondary | ICD-10-CM | POA: Diagnosis not present

## 2022-12-10 DIAGNOSIS — M542 Cervicalgia: Secondary | ICD-10-CM | POA: Diagnosis not present

## 2022-12-10 DIAGNOSIS — G8929 Other chronic pain: Secondary | ICD-10-CM | POA: Diagnosis not present

## 2022-12-10 DIAGNOSIS — E119 Type 2 diabetes mellitus without complications: Secondary | ICD-10-CM | POA: Insufficient documentation

## 2022-12-10 MED ORDER — OXYCODONE-ACETAMINOPHEN 5-325 MG PO TABS: 1.0000 | ORAL_TABLET | Freq: Four times a day (QID) | ORAL | 0 refills | Status: DC | PRN

## 2022-12-10 MED ORDER — LIDOCAINE 5 % EX PTCH
1.0000 | MEDICATED_PATCH | CUTANEOUS | Status: DC
  Administered 2022-12-10: 1 via TRANSDERMAL
  Filled 2022-12-10: qty 1

## 2022-12-10 MED ORDER — OXYCODONE-ACETAMINOPHEN 5-325 MG PO TABS
1.0000 | ORAL_TABLET | Freq: Once | ORAL | Status: AC
  Administered 2022-12-10: 1 via ORAL
  Filled 2022-12-10: qty 1

## 2022-12-10 NOTE — ED Triage Notes (Signed)
Pt. Stated, I've had left side neck pain since Wednesday. I have bad arthritis. I usually go to the [place] but couldn't wait it was so painful. Ive taken BC's, Ibuprofen to the point my stomach hurts.

## 2022-12-10 NOTE — ED Provider Notes (Signed)
[place] EMERGENCY DEPARTMENT AT [facility] Provider Note   CSN: [id number] Arrival date & time: 12/10/22  4098     History Chief Complaint  Patient presents with   Neck Pain   Shoulder Pain    Dennis Dominguez is a 67 y.o. male with h/o DM, HTN, chronic neck pain, presents emergency room today for evaluation of neck pain.  Patient reports has been experiencing neck pain off and on for the past 3 years.  [facility].  He reports he is already had multiple MRIs and has been referred to pain clinics however he is not under any pain management care currently.  He reports that he is was to be seeing a specialist however has not been given an appointment by the [place] yet.  He reports that over the past 4 to 5 days he has had worsening pain which is where he usually is.  He reports it goes from his neck into the beginning of his shoulder whenever he turns his head to the left.  He is not experiencing headache or visual changes.  He is not experiencing any weakness or numbness or tingling in his extremities.  He denies any fevers.  He reports he has tried [facility] powders, Tylenol, and ibuprofen without much relief of pain.  Nursing note mentions that he was having abdominal pain however he denies this to me.  Denies any hematochezia or any melena as well.  He reports his latest MRI was in the past year.  He reports that his MRI showed "arthritis".  He denies that any nerve impingement or disc bulge sounds familiar to him.  He denies any new trauma to the area.  Denies any heavy lifting.  Denies any chiropractic adjustments or any neck massages.  He denies any lightheadedness feeling.  He reports he is allergic to penicillin.   Neck Pain Associated symptoms: no chest pain, no fever, no headaches, no numbness and no weakness   Shoulder Pain Associated symptoms: neck pain   Associated symptoms: no back pain and no fever        Home Medications Prior to  Admission medications   Medication Sig Start Date End Date Taking? Authorizing Provider  oxyCODONE-acetaminophen (PERCOCET/ROXICET) 5-325 MG tablet Take 1 tablet by mouth every 6 (six) hours as needed for severe pain. 12/10/22  Yes Achille Rich, PA-C  albuterol (PROVENTIL HFA;VENTOLIN HFA) 108 (90 BASE) MCG/ACT inhaler Inhale 2 puffs into the lungs every 6 (six) hours as needed for wheezing or shortness of breath.     [provider]  aspirin EC 81 MG tablet Take 162 mg by mouth daily with breakfast.     [provider]  benzonatate (TESSALON) 100 MG capsule Take 1 capsule (100 mg total) by mouth 3 (three) times daily as needed for cough. 02/04/22   Fayrene Helper, PA-C  doxycycline (VIBRAMYCIN) 100 MG capsule Take 1 capsule (100 mg total) by mouth 2 (two) times daily. 08/15/20   Jeannie Fend, PA-C  fish oil-omega-3 fatty acids 1000 MG capsule Take 1 g by mouth daily with breakfast.     [provider]  ibuprofen (ADVIL,MOTRIN) 200 MG tablet Take 400-600 mg by mouth every 6 (six) hours as needed for moderate pain.     [provider]  Ipratropium-Albuterol (COMBIVENT RESPIMAT) 20-100 MCG/ACT AERS respimat Inhale 1 puff into the lungs every 6 (six) hours. 08/15/20   Jeannie Fend, PA-C  lisinopril (PRINIVIL,ZESTRIL)  20 MG tablet Take 20 mg by mouth daily.     [provider]  predniSONE (DELTASONE) 20 MG tablet 3 tabs po day one, then 2 tabs daily x 4 days 02/04/22   Fayrene Helper, PA-C  simvastatin (ZOCOR) 80 MG tablet Take 80 mg by mouth daily.    [provider]      Allergies    Penicillins and Bactrim [sulfamethoxazole-trimethoprim]    Review of Systems   Review of Systems  Constitutional:  Negative for chills and fever.  Respiratory:  Negative for shortness of breath.   Cardiovascular:  Negative for chest pain.  Gastrointestinal:  Negative for abdominal pain.  Musculoskeletal:  Positive for neck pain. Negative for back pain and neck  stiffness.  Neurological:  Negative for weakness, numbness and headaches.    Physical Exam Updated Vital Signs BP (!) 145/71   Pulse 67   Temp 98.2 F (36.8 C)   Resp 18   Ht 5\' 9"  (1.753 m)   Wt 110.2 kg   SpO2 98%   BMI 35.88 kg/m  Physical Exam Vitals and nursing note reviewed.  Constitutional:      General: He is not in acute distress.    Appearance: He is not ill-appearing or toxic-appearing.  HENT:     Mouth/Throat:     Mouth: Mucous membranes are moist.  Eyes:     General: No scleral icterus. Neck:     Comments: The patient does not have any tenderness to the anterior, lateral, or posterior neck.  He does not have any overlying skin changes other than some dry skin present.  No overlying warmth erythema.  No fluctuance or induration.  No rash or lesions seen.  There is no obvious swelling.  He has full range of motion but does have pain more with turning his head to the left.  There is no midline tenderness palpation. Cardiovascular:     Rate and Rhythm: Normal rate.  Pulmonary:     Effort: Pulmonary effort is normal. No respiratory distress.     Breath sounds: Normal breath sounds.  Musculoskeletal:     Cervical back: Neck supple. No rigidity.     Comments: Palpable radial pulses.  Strength is equal in patient's upper and lower bilateral extremities.  Ambulatory without issue.  He reports sensations intact bilaterally in his upper and lower extremities per patient.  Good grip strength.  Lymphadenopathy:     Cervical: No cervical adenopathy.  Skin:    General: Skin is warm and dry.  Neurological:     General: No focal deficit present.     Mental Status: He is alert.     Motor: No weakness.     Gait: Gait normal.     ED Results / Procedures / Treatments   Labs (all labs ordered are listed, but only abnormal results are displayed) Labs Reviewed - No data to display  EKG None  Radiology No results found.  Procedures Procedures   Medications Ordered in  ED Medications  lidocaine (LIDODERM) 5 % 1 patch (1 patch Transdermal Patch Applied 12/10/22 1150)  oxyCODONE-acetaminophen (PERCOCET/ROXICET) 5-325 MG per tablet 1 tablet (1 tablet Oral Given 12/10/22 1150)    ED Course/ Medical Decision Making/ A&P                               Medical Decision Making Risk Prescription drug management.   67 y.o. male presents to the ER  today for evaluation of chronic neck pain. Differential diagnosis includes but is not limited to arthritis, impingement, meningitis, chronic pain, central cord syndrome, vascular etiology. Vital signs show elevated BP otherwise unremarkable. Physical exam as noted above.   The patient has not had any recent adjustments from chiropractic or any massages.  He denies any new trauma or any heavy lifting.  He reports that this feels like exacerbation of his pain.  He is been seen at the [place] usually for this but reports that he could not wait to be seen.  He does not have any overlying skin changes.  Does not have any focal deficit.  He is neurovascularly intact distally.  Good strength including grip strength.  Cap refill brisk in all 5 fingers.  Palpable radial pulses.  He is ambulatory without issue.  He has full range of motion of his neck which is has pain with looking more to the left.  I do not see any overlying skin changes noted.  There is no overlying erythema or warmth.  I will lower suspicion for any meningitis given that he does not have any altered mental status or any fever.  He does not have any nuchal rigidity.  He does not have any sensory deficit or any strength deficit.  I have a lower station for any central cord syndrome or impingement.  He is not having any headache or any lightheadedness.  He has symmetric radial pulses, I have a lower excision for any vascular etiology.  This is likely worsening or already known arthritis in his neck.  Given that he is not having significantly worsening of pain or any new injury to the  area, do not think any imaging is needed.  Will provide patient Percocet here and lidocaine patch and reassess.  On reevaluation, patient reports that he is feeling significantly better and feels that he can move his neck more.  I advised him to have some lidocaine patches over-the-counter.  I will prescribe the patient a short course of narcotic pain medication to help with this.  Advised that he follow-up with his primary care doctor at the [facility] or his specialist for further information and for further reevaluation.  The patient is taking a taxi to the pharmacy and to home.  He is not driving.  We discussed plan at bedside. We discussed strict return precautions and red flag symptoms. The patient verbalized their understanding and agrees to the plan. The patient is stable and being discharged home in good condition.  Portions of this report may have been transcribed using voice recognition software. Every effort was made to ensure accuracy; however, inadvertent computerized transcription errors may be present.   I discussed this case with my attending physician who cosigned this note including patient's presenting symptoms, physical exam, and planned diagnostics and interventions. Attending physician stated agreement with plan or made changes to plan which were implemented.    Final Clinical Impression(s) / ED Diagnoses Final diagnoses:  Chronic neck pain    Rx / DC Orders ED Discharge Orders          Ordered    oxyCODONE-acetaminophen (PERCOCET/ROXICET) 5-325 MG tablet  Every 6 hours PRN        12/10/22 1422              Achille Rich, [place] 12/10/22 1718    Terald Sleeper, MD 12/11/22 1940

## 2022-12-10 NOTE — Discharge Instructions (Addendum)
You were seen in the ER today for evaluation of your chronic neck pain.  We have treated this with narcotic pain medication.  And glad that you are feeling better.  I have sent you in a few narcotic pain medications to take as needed.  I also recommend trying some topical lidocaine patches which she can pick up over-the-counter.  Please do not drive or operate heavy machinery while on the narcotic pain medication as it can make you sleepy.  Additionally, please make [facility].  You have worsening pain, neck stiffness, headaches, skin changes, weakness in your arms or any numbness or tingling in your arms, please return to your nearest emergency department.  If you have any concerns, new or worsening symptoms, please return to the nearest emergency department for evaluation.  Contact a doctor if: Your condition does not get better with treatment. Get help right away if: Your pain gets worse and medicine does not help. You lose feeling or feel weak in your hand, arm, face, or leg. You have a high fever. Your neck is stiff. You cannot control when you poop or pee (have incontinence). You have trouble with walking, balance, or talking.

## 2022-12-14 ENCOUNTER — Encounter (HOSPITAL_COMMUNITY): Payer: Self-pay

## 2022-12-14 ENCOUNTER — Other Ambulatory Visit: Payer: Self-pay

## 2022-12-14 ENCOUNTER — Emergency Department (HOSPITAL_COMMUNITY)
Admission: EM | Admit: 2022-12-14 | Discharge: 2022-12-14 | Disposition: A | Discharge status: Home / Self Care | Payer: No Typology Code available for payment source | Attending: Emergency Medicine | Admitting: Emergency Medicine

## 2022-12-14 ENCOUNTER — Emergency Department (HOSPITAL_COMMUNITY): Payer: No Typology Code available for payment source

## 2022-12-14 DIAGNOSIS — Z7982 Long term (current) use of aspirin: Secondary | ICD-10-CM | POA: Insufficient documentation

## 2022-12-14 DIAGNOSIS — M542 Cervicalgia: Secondary | ICD-10-CM | POA: Diagnosis present

## 2022-12-14 MED ORDER — HYDROCODONE-ACETAMINOPHEN 5-325 MG PO TABS
2.0000 | ORAL_TABLET | Freq: Once | ORAL | Status: AC
  Administered 2022-12-14: 2 via ORAL
  Filled 2022-12-14: qty 2

## 2022-12-14 MED ORDER — HYDROCODONE-ACETAMINOPHEN 5-325 MG PO TABS: 1.0000 | ORAL_TABLET | Freq: Four times a day (QID) | ORAL | 0 refills | Status: DC | PRN

## 2022-12-14 NOTE — ED Triage Notes (Signed)
Left sided neck pain and shoulder pain since 8 days ago. [facility] and pain meds but now the meds are not working, pain 10/10.

## 2022-12-14 NOTE — ED Provider Notes (Signed)
[place] EMERGENCY DEPARTMENT AT [facility] Provider Note   CSN: [id number] Arrival date & time: 12/14/22  0747     History  Chief Complaint  Patient presents with   Shoulder Pain    With neck pain    Dennis Dominguez is a 67 y.o. male with reported history of "cervical arthritis" he typically follows at the [place] presenting to the ED with worsening neck pain.  The patient was seen several days ago in the emergency department with complaint of acute worsening of his neck pain.  He denies any trauma, manipulation, chiropractor, massage.  He says the pain is in the lower midline aspect of his neck, sometimes radiates into his left shoulder.  He did have some pain relief with the Percocet prescribed in the ED but this was a very short course and he is out of this medication.  He has gone back and forth at the [place], and says that they have tried steroid injections and NSAIDs but these are not achieving pain relief.  He is currently trying to set up pain management follow-up with the [facility].  But he reports that this is all a new issue for him, over the past week, typically had been dealing with the pain fine prior to this.  HPI     Home Medications Prior to Admission medications   Medication Sig Start Date End Date Taking? Authorizing Provider  HYDROcodone-acetaminophen (NORCO/VICODIN) 5-325 MG tablet Take 1 tablet by mouth every 6 (six) hours as needed for up to 20 doses for severe pain. 12/14/22  Yes Verner Kopischke, Kermit Balo, MD  albuterol (PROVENTIL HFA;VENTOLIN HFA) 108 (90 BASE) MCG/ACT inhaler Inhale 2 puffs into the lungs every 6 (six) hours as needed for wheezing or shortness of breath.     [provider]  aspirin EC 81 MG tablet Take 162 mg by mouth daily with breakfast.     [provider]  benzonatate (TESSALON) 100 MG capsule Take 1 capsule (100 mg total) by mouth 3 (three) times daily as needed for cough. 02/04/22   Fayrene Helper, PA-C  doxycycline  (VIBRAMYCIN) 100 MG capsule Take 1 capsule (100 mg total) by mouth 2 (two) times daily. 08/15/20   Jeannie Fend, PA-C  fish oil-omega-3 fatty acids 1000 MG capsule Take 1 g by mouth daily with breakfast.     [provider]  ibuprofen (ADVIL,MOTRIN) 200 MG tablet Take 400-600 mg by mouth every 6 (six) hours as needed for moderate pain.     [provider]  Ipratropium-Albuterol (COMBIVENT RESPIMAT) 20-100 MCG/ACT AERS respimat Inhale 1 puff into the lungs every 6 (six) hours. 08/15/20   Jeannie Fend, PA-C  lisinopril (PRINIVIL,ZESTRIL) 20 MG tablet Take 20 mg by mouth daily.     [provider]  oxyCODONE-acetaminophen (PERCOCET/ROXICET) 5-325 MG tablet Take 1 tablet by mouth every 6 (six) hours as needed for severe pain. 12/10/22   Achille Rich, PA-C  predniSONE (DELTASONE) 20 MG tablet 3 tabs po day one, then 2 tabs daily x 4 days 02/04/22   Fayrene Helper, PA-C  simvastatin (ZOCOR) 80 MG tablet Take 80 mg by mouth daily.    [provider]      Allergies    Penicillins and Bactrim [sulfamethoxazole-trimethoprim]    Review of Systems   Review of Systems  Physical Exam Updated Vital Signs BP (!) 171/84 (BP Location: Right Arm)   Pulse 65   Temp 98.4 F (36.9 C) (Oral)   Resp  18   Ht 5\' 9"  (1.753 m)   Wt 110 kg   SpO2 100%   BMI 35.81 kg/m  Physical Exam Constitutional:      General: He is not in acute distress. HENT:     Head: Normocephalic and atraumatic.  Eyes:     Conjunctiva/sclera: Conjunctivae normal.     Pupils: Pupils are equal, round, and reactive to light.  Cardiovascular:     Rate and Rhythm: Normal rate and regular rhythm.  Pulmonary:     Effort: Pulmonary effort is normal. No respiratory distress.  Abdominal:     General: There is no distension.     Tenderness: There is no abdominal tenderness.  Skin:    General: Skin is warm and dry.  Neurological:     General: No focal deficit present.     Mental Status: He is alert.  Mental status is at baseline.  Psychiatric:        Mood and Affect: Mood normal.        Behavior: Behavior normal.     ED Results / Procedures / Treatments   Labs (all labs ordered are listed, but only abnormal results are displayed) Labs Reviewed - No data to display  EKG None  Radiology CT Cervical Spine Wo Contrast  Result Date: 12/14/2022 CLINICAL DATA:  Neck pain, chronic, degenerative changes on xray acute worsening of neck pain - evaluation for occult cervical fx vs bony lesion/malignancy. EXAM: CT CERVICAL SPINE WITHOUT CONTRAST TECHNIQUE: Multidetector CT imaging of the cervical spine was performed without intravenous contrast. Multiplanar CT image reconstructions were also generated. RADIATION DOSE REDUCTION: This exam was performed according to the departmental dose-optimization program which includes automated exposure control, adjustment of the mA and/or kV according to patient size and/or use of iterative reconstruction technique. COMPARISON:  None Available. FINDINGS: Alignment: Normal. Skull base and vertebrae: No acute fracture. Multilevel degenerative endplate changes, most significant at C5-6 and C6-7. No suspicious bone lesions. Soft tissues and spinal canal: No prevertebral fluid or swelling. No visible canal hematoma. Disc levels: C2-C3: No spinal canal stenosis or neural foraminal narrowing. Acquired fusion of the left facet joint. C3-C4: Severe left-greater-than-right facet arthropathy and uncovertebral joint spurring results in severe left neural foraminal narrowing. C4-C5: Left-greater-than-right facet arthropathy and uncovertebral joint spurring results in moderate left neural foraminal narrowing. C5-C6: Disc bulge results in at least moderate spinal canal stenosis. Right-greater-than-left facet arthropathy and uncovertebral joint spurring results in severe right and moderate left neural foraminal narrowing. C6-C7: Disc-osteophyte complex results in at least moderate  spinal canal stenosis. Left-greater-than-right facet arthropathy and uncovertebral joint spurring results in moderate left neural foraminal narrowing. C7-T1: No spinal canal stenosis or neural foraminal narrowing. Mild bilateral facet arthropathy. Upper chest: Unremarkable. Other: Atherosclerotic calcifications of the carotid bulbs. IMPRESSION: 1. No acute fracture or traumatic listhesis of the cervical spine. 2. Multilevel cervical spondylosis, most significant at C5-6 and C6-7, where there is at least moderate spinal canal stenosis. 3. Multilevel left-greater-than-right facet arthropathy contributes to moderate to severe neural foraminal narrowing, as detailed above. Electronically Signed   By: Orvan Falconer M.D.   On: 12/14/2022 09:07    Procedures Procedures    Medications Ordered in ED Medications  HYDROcodone-acetaminophen (NORCO/VICODIN) 5-325 MG per tablet 2 tablet (2 tablets Oral Given 12/14/22 0830)    ED Course/ Medical Decision Making/ A&P Clinical Course as of 12/14/22 0924  Thu Dec 14, 2022  0921 Patient is reassessed and feeling much better with the neck pain.  He  has good range of motion at this time.  Will prescribe pain medication and he is calling a cab for a safe ride home [MT]    Clinical Course User Index [MT] Irelyn Perfecto, Kermit Balo, MD                                 Medical Decision Making Amount and/or Complexity of Data Reviewed Radiology: ordered.  Risk Prescription drug management.   Patient is presenting with acute on chronic neck pain.  No reported trauma but I do think it is reasonable to obtain a CT image at this time to evaluate for an occult fracture versus evidence of bony malignancy.  He has some radicular symptoms into his left shoulder which may indicate a cervical radiculopathy, but does not have any objective weakness of the extremities to warrant emergent MRI scan at this time.  He was given Norco for pain with some improvement on reassessment.  CT  imaging was personally reviewed and interpreted, showing degenerative disc disease, likely lower cervical radiculopathy or impingement.  No acute fracture noted.  The patient understands that he does need to follow-up with the [facility] and likely needs referral at this point to a pain management specialty.  He understands that the emergency department is not the place to come for refills for pain medications.  I did review PDMP and he does not have a long history of requiring or taking opioid narcotics or pain medications, and it is clear that his level of distress is interfering with his ability to function daily, as well as sleep at night.  I do think a short-term prescription be reasonable to look and reach back out to the [place].  The patient understands the risks of opioid narcotics including dependency and worsening pain.        Final Clinical Impression(s) / ED Diagnoses Final diagnoses:  Neck pain    Rx / DC Orders ED Discharge Orders          Ordered    HYDROcodone-acetaminophen (NORCO/VICODIN) 5-325 MG tablet  Every 6 hours PRN        12/14/22 0922              Terald Sleeper, MD 12/14/22 [id number]

## 2022-12-14 NOTE — Discharge Instructions (Signed)
It is very important that you follow-up with the [facility] for your neck pain.  You may need physical therapy or pain management referral.  Your pain management should be handled moving forward by the [facility], or your primary care clinic.  The emergency department should not be the place to come for refill for pain medications.  You were given one more short-term prescription of narcotic pain medicine, in order to help you over the next several days until you are able to follow-up with your primary care clinic.  Moving forward these opioid and narcotic medicines are unlikely to be prescribed in the ER.  You should not drive or operate a car after receiving narcotic medications, including today after your visit.  *  You should call 911 or return to the hospital if you begin to develop weakness or loss of feeling in your arms or legs.

## 2022-12-18 ENCOUNTER — Emergency Department (HOSPITAL_COMMUNITY)
Admission: EM | Admit: 2022-12-18 | Discharge: 2022-12-18 | Disposition: A | Discharge status: Home / Self Care | Payer: No Typology Code available for payment source | Attending: Emergency Medicine | Admitting: Emergency Medicine

## 2022-12-18 ENCOUNTER — Encounter (HOSPITAL_COMMUNITY): Payer: Self-pay

## 2022-12-18 ENCOUNTER — Other Ambulatory Visit: Payer: Self-pay

## 2022-12-18 ENCOUNTER — Emergency Department (HOSPITAL_COMMUNITY): Payer: No Typology Code available for payment source

## 2022-12-18 DIAGNOSIS — Z7982 Long term (current) use of aspirin: Secondary | ICD-10-CM | POA: Insufficient documentation

## 2022-12-18 DIAGNOSIS — R531 Weakness: Secondary | ICD-10-CM | POA: Diagnosis not present

## 2022-12-18 DIAGNOSIS — J449 Chronic obstructive pulmonary disease, unspecified: Secondary | ICD-10-CM | POA: Diagnosis not present

## 2022-12-18 LAB — URINALYSIS, ROUTINE W REFLEX MICROSCOPIC
Bacteria, UA: NONE SEEN
Bilirubin Urine: NEGATIVE
Glucose, UA: NEGATIVE mg/dL
Hgb urine dipstick: NEGATIVE
Ketones, ur: NEGATIVE mg/dL
Leukocytes,Ua: NEGATIVE
Nitrite: NEGATIVE
Protein, ur: 30 mg/dL — AB
Specific Gravity, Urine: 1.019 (ref 1.005–1.030)
pH: 5 (ref 5.0–8.0)

## 2022-12-18 LAB — BASIC METABOLIC PANEL
Anion gap: 15 (ref 5–15)
BUN: 12 mg/dL (ref 8–23)
CO2: 22 mmol/L (ref 22–32)
Calcium: 9.8 mg/dL (ref 8.9–10.3)
Chloride: 99 mmol/L (ref 98–111)
Creatinine, Ser: 0.8 mg/dL (ref 0.61–1.24)
GFR, Estimated: >60 mL/min (ref >60)
Glucose, Bld: 103 mg/dL — ABNORMAL HIGH (ref 70–99)
Potassium: 4.1 mmol/L (ref 3.5–5.1)
Sodium: 136 mmol/L (ref 135–145)

## 2022-12-18 LAB — TROPONIN I (HIGH SENSITIVITY)
Troponin I (High Sensitivity): 7 ng/L (ref <18)
Troponin I (High Sensitivity): 8 ng/L (ref <18)

## 2022-12-18 LAB — CBC WITH DIFFERENTIAL/PLATELET
Abs Immature Granulocytes: 0.06 10*3/uL (ref 0.00–0.07)
Basophils Absolute: 0.1 10*3/uL (ref 0.0–0.1)
Basophils Relative: 1 %
Eosinophils Absolute: 0.5 10*3/uL (ref 0.0–0.5)
Eosinophils Relative: 4 %
HCT: 42.1 % (ref 39.0–52.0)
Hemoglobin: 13.9 g/dL (ref 13.0–17.0)
Immature Granulocytes: 1 %
Lymphocytes Relative: 23 %
Lymphs Abs: 3 10*3/uL (ref 0.7–4.0)
MCH: 27.7 pg (ref 26.0–34.0)
MCHC: 33 g/dL (ref 30.0–36.0)
MCV: 83.9 fL (ref 80.0–100.0)
Monocytes Absolute: 1.3 10*3/uL — ABNORMAL HIGH (ref 0.1–1.0)
Monocytes Relative: 10 %
Neutro Abs: 7.8 10*3/uL — ABNORMAL HIGH (ref 1.7–7.7)
Neutrophils Relative %: 61 %
Platelets: 408 10*3/uL — ABNORMAL HIGH (ref 150–400)
RBC: 5.02 MIL/uL (ref 4.22–5.81)
RDW: 14.5 % (ref 11.5–15.5)
WBC: 12.8 10*3/uL — ABNORMAL HIGH (ref 4.0–10.5)
nRBC: 0 % (ref 0.0–0.2)

## 2022-12-18 LAB — TSH: TSH: 1.545 u[IU]/mL (ref 0.350–4.500)

## 2022-12-18 NOTE — ED Provider Notes (Signed)
[place] EMERGENCY DEPARTMENT AT [facility] Provider Note   CSN: [id number] Arrival date & time: 12/18/22  0825     History  Chief Complaint  Patient presents with   Chest Pain   Neck Pain    Dennis Dominguez is a 67 y.o. male.  This is a 67 year old male here today for generalized weakness which has been ongoing for several weeks.  Patient has a past medical history significant for COPD, not currently on any home O2, chronic neck pain.  Patient was seen here on the ninth for neck pain.  Patient says that over the last several weeks he has been feeling increasingly weak.  He denies dyspnea on exertion.  He says that when he stands up for too long he begins to feel very tired.  He has followed up with his primary care doctor regarding this.  Patient denies any dark stools.  Patient says that this morning he felt like he was having an anxiety attack.  I reviewed the patient's medications.   Chest Pain Neck Pain Associated symptoms: chest pain        Home Medications Prior to Admission medications   Medication Sig Start Date End Date Taking? Authorizing Provider  albuterol (PROVENTIL HFA;VENTOLIN HFA) 108 (90 BASE) MCG/ACT inhaler Inhale 2 puffs into the lungs every 6 (six) hours as needed for wheezing or shortness of breath.     [provider]  aspirin EC 81 MG tablet Take 162 mg by mouth daily with breakfast.     [provider]  benzonatate (TESSALON) 100 MG capsule Take 1 capsule (100 mg total) by mouth 3 (three) times daily as needed for cough. 02/04/22   Fayrene Helper, PA-C  doxycycline (VIBRAMYCIN) 100 MG capsule Take 1 capsule (100 mg total) by mouth 2 (two) times daily. 08/15/20   Jeannie Fend, PA-C  fish oil-omega-3 fatty acids 1000 MG capsule Take 1 g by mouth daily with breakfast.     [provider]  HYDROcodone-acetaminophen (NORCO/VICODIN) 5-325 MG tablet Take 1 tablet by mouth every 6 (six) hours as needed for up to 20  doses for severe pain. 12/14/22   Terald Sleeper, MD  ibuprofen (ADVIL,MOTRIN) 200 MG tablet Take 400-600 mg by mouth every 6 (six) hours as needed for moderate pain.     [provider]  Ipratropium-Albuterol (COMBIVENT RESPIMAT) 20-100 MCG/ACT AERS respimat Inhale 1 puff into the lungs every 6 (six) hours. 08/15/20   Jeannie Fend, PA-C  lisinopril (PRINIVIL,ZESTRIL) 20 MG tablet Take 20 mg by mouth daily.     [provider]  oxyCODONE-acetaminophen (PERCOCET/ROXICET) 5-325 MG tablet Take 1 tablet by mouth every 6 (six) hours as needed for severe pain. 12/10/22   Achille Rich, PA-C  predniSONE (DELTASONE) 20 MG tablet 3 tabs po day one, then 2 tabs daily x 4 days 02/04/22   Fayrene Helper, PA-C  simvastatin (ZOCOR) 80 MG tablet Take 80 mg by mouth daily.    [provider]      Allergies    Penicillins and Bactrim [sulfamethoxazole-trimethoprim]    Review of Systems   Review of Systems  Cardiovascular:  Positive for chest pain.  Musculoskeletal:  Positive for neck pain.    Physical Exam Updated Vital Signs BP (!) 144/77 (BP Location: Right Arm)   Pulse 90   Temp 97.8 F (36.6 C) (Oral)   Resp (!) 22   Ht 5\' 9"  (1.753 m)   Wt 109.8 kg   SpO2  100%   BMI 35.74 kg/m  Physical Exam Vitals reviewed.  Constitutional:      General: He is not in acute distress. Cardiovascular:     Rate and Rhythm: Normal rate.     Heart sounds: Normal heart sounds.  Pulmonary:     Effort: No tachypnea or respiratory distress.     Breath sounds: Normal breath sounds.  Chest:     Chest wall: No mass or deformity.  Abdominal:     Palpations: Abdomen is soft.  Musculoskeletal:        General: Normal range of motion.  Skin:    Findings: No rash.  Neurological:     General: No focal deficit present.  Psychiatric:        Mood and Affect: Mood normal.     ED Results / Procedures / Treatments   Labs (all labs ordered are listed, but only abnormal results are  displayed) Labs Reviewed  BASIC METABOLIC PANEL - Abnormal; Notable for the following components:      Result Value   Glucose, Bld 103 (*)    All other components within normal limits  URINALYSIS, ROUTINE W REFLEX MICROSCOPIC - Abnormal; Notable for the following components:   Protein, ur 30 (*)    All other components within normal limits  CBC WITH DIFFERENTIAL/PLATELET - Abnormal; Notable for the following components:   WBC 12.8 (*)    Platelets 408 (*)    Neutro Abs 7.8 (*)    Monocytes Absolute 1.3 (*)    All other components within normal limits  TSH  TROPONIN I (HIGH SENSITIVITY)  TROPONIN I (HIGH SENSITIVITY)    EKG EKG Interpretation Date/Time:  Monday December 18 2022 08:36:43 EDT Ventricular Rate:  91 PR Interval:  134 QRS Duration:  99 QT Interval:  346 QTC Calculation: 426 R Axis:   -80  Text Interpretation: Sinus rhythm Atrial premature complex LAD, consider left anterior fascicular block RSR' in V1 or V2, right VCD or RVH No acute changes Confirmed by Anders Simmonds [phone]) on 12/18/2022 10:20:02 AM  Radiology DG Chest Portable 1 View  Result Date: 12/18/2022 CLINICAL DATA:  Chest pain EXAM: PORTABLE CHEST 1 VIEW COMPARISON:  Chest x-ray dated February 04, 2022 FINDINGS: Cardiac and mediastinal contours within normal limits for exam technique. No consolidation. No evidence of pleural effusion or pneumothorax. IMPRESSION: No evidence of active disease. Electronically Signed   By: Allegra Lai M.D.   On: 12/18/2022 10:30    Procedures Procedures    Medications Ordered in ED Medications - No data to display  ED Course/ Medical Decision Making/ A&P Clinical Course as of 12/18/22 1254  Mon Dec 18, 2022  1252 Troponin I (High Sensitivity) [JS]    Clinical Course User Index [JS] Arletha Pili, DO                                 Medical Decision Making 67 year old male here today for generalized weakness.  Differential diagnoses include anemia,  deconditioning, COPD, ACS, less likely sepsis.    Patient with reassuring vital signs.  Overall looks well.  Patient has been having chronic generalized weakness over the last several weeks to months.  Will obtain blood work on the patient.  My dependent review of the patient's EKG does not show evidence of acute ischemia.  Troponin ordered.  Reassessment-my dependent review the patient's chest x-ray was no pneumonia.  I reviewed the patient's labs,  mild leukocytosis to 12.8, nonspecific.  Electrolytes normal.  Troponin flat.  Will discharge patient, have him follow-up with his PCP.  TSH ordered.    Amount and/or Complexity of Data Reviewed Labs: ordered. Decision-making details documented in ED Course. Radiology: ordered.           Final Clinical Impression(s) / ED Diagnoses Final diagnoses:  Weakness generalized    Rx / DC Orders ED Discharge Orders     None         Arletha Pili, DO 12/18/22 1254

## 2022-12-18 NOTE — ED Triage Notes (Addendum)
Patient arrives by EMS from home with c/o intermittent chest pain x2 days.   Patient reports this morning while watching TV started having sharp pains to left side of chest.   Patient also reports intermittent neck pain that has been going on for months.   Patient has been seen at the [facility] and [facility].  Patient reports this morning had an anxiety attack and states "hasn't had one in years".   Patient reports took 2 hydrocodone 1.5 hours PTA and reports pain is now a 7/10.    EMS VITALS:   BP 174/100 84 Irregular 96% RA CBG 128

## 2022-12-18 NOTE — Discharge Instructions (Signed)
Your test that were done today were normal.  At this time, I do not have a clear cause for what is causing your symptoms, but it does not appear to be an emergency at this time.  I would like you to follow-up with your primary care doctor within 1 week.

## 2023-08-21 ENCOUNTER — Encounter: Payer: Self-pay | Admitting: Vascular Surgery

## 2023-08-21 ENCOUNTER — Encounter: Admitting: Vascular Surgery

## 2023-08-21 ENCOUNTER — Other Ambulatory Visit (HOSPITAL_COMMUNITY)

## 2023-08-21 ENCOUNTER — Other Ambulatory Visit (HOSPITAL_COMMUNITY): Payer: Self-pay | Admitting: Vascular Surgery

## 2023-08-21 ENCOUNTER — Ambulatory Visit
Admission: RE | Admit: 2023-08-21 | Discharge: 2023-08-21 | Disposition: A | Discharge status: Home / Self Care | Payer: Self-pay | Source: Ambulatory Visit | Attending: Vascular Surgery

## 2023-08-21 ENCOUNTER — Other Ambulatory Visit: Payer: Self-pay

## 2023-08-21 ENCOUNTER — Ambulatory Visit (INDEPENDENT_AMBULATORY_CARE_PROVIDER_SITE_OTHER): Admitting: Vascular Surgery

## 2023-08-21 VITALS — BP 119/81 | HR 97 | Temp 97.9°F | Resp 20 | Ht 69.0 in | Wt 240.7 lb

## 2023-08-21 DIAGNOSIS — E785 Hyperlipidemia, unspecified: Secondary | ICD-10-CM | POA: Insufficient documentation

## 2023-08-21 DIAGNOSIS — I7143 Infrarenal abdominal aortic aneurysm, without rupture: Secondary | ICD-10-CM

## 2023-08-21 DIAGNOSIS — I10 Essential (primary) hypertension: Secondary | ICD-10-CM | POA: Insufficient documentation

## 2023-08-21 DIAGNOSIS — F329 Major depressive disorder, single episode, unspecified: Secondary | ICD-10-CM | POA: Insufficient documentation

## 2023-08-21 DIAGNOSIS — G8929 Other chronic pain: Secondary | ICD-10-CM | POA: Insufficient documentation

## 2023-08-21 DIAGNOSIS — F1020 Alcohol dependence, uncomplicated: Secondary | ICD-10-CM | POA: Insufficient documentation

## 2023-08-21 DIAGNOSIS — I714 Abdominal aortic aneurysm, without rupture, unspecified: Secondary | ICD-10-CM

## 2023-08-21 DIAGNOSIS — J449 Chronic obstructive pulmonary disease, unspecified: Secondary | ICD-10-CM | POA: Insufficient documentation

## 2023-08-21 DIAGNOSIS — Z5900 Homelessness unspecified: Secondary | ICD-10-CM | POA: Insufficient documentation

## 2023-08-21 MED ORDER — DIAZEPAM 2 MG PO TABS: 2.0000 mg | ORAL_TABLET | Freq: Four times a day (QID) | ORAL | 0 refills | Status: DC | PRN

## 2023-08-21 NOTE — Progress Notes (Deleted)
 Patient name: Dennis Dominguez MRN: [id number] DOB: June 30, 1955 Sex: male  REASON FOR CONSULT: 8 cm AAA  HPI: Dennis Dominguez is a 68 y.o. male, with history of hypertension, hyperlipidemia, COPD that presents for evaluation of 8 cm abdominal aortic aneurysm.  Patient is referred from the [place].  Past Medical History:  Diagnosis Date   Asthma    COPD (chronic obstructive pulmonary disease) (HCC)    High cholesterol    Hypertension    Kidney stones    Renal disorder     Past Surgical History:  Procedure Laterality Date   lithrotripsy      Family History  Problem Relation Age of Onset   Hypertension Mother    Diabetes Mother     SOCIAL HISTORY: Social History   Socioeconomic History   Marital status: Divorced    Spouse name: Not on file   Number of children: Not on file   Years of education: Not on file   Highest education level: Not on file  Occupational History   Not on file  Tobacco Use   Smoking status: Every Day    Current packs/day: 2.00    Types: Cigarettes   Smokeless tobacco: Never  Vaping Use   Vaping status: Never Used  Substance and Sexual Activity   Alcohol use: No   Drug use: No   Sexual activity: Never  Other Topics Concern   Not on file  Social History Narrative   Not on file   Social Drivers of Health   Financial Resource Strain: Not on file  Food Insecurity: Not on file  Transportation Needs: Not on file  Physical Activity: Not on file  Stress: Not on file  Social Connections: Not on file  Intimate Partner Violence: Not on file    Allergies  Allergen Reactions   Penicillin G Shortness Of Breath    Other Reaction(s): Edema, Dyspnea   Penicillins Anaphylaxis    Has patient had a PCN reaction causing immediate rash, facial/tongue/throat swelling, SOB or lightheadedness with hypotension: Yes Has patient had a PCN reaction causing severe rash involving mucus membranes or skin necrosis: Yes Has patient had a PCN reaction that  required hospitalization: Yes Has patient had a PCN reaction occurring within the last 10 years: No If all of the above answers are "NO", then may proceed with Cephalosporin use.    Bactrim [Sulfamethoxazole-Trimethoprim] Rash    Current Outpatient Medications  Medication Sig Dispense Refill   acetaminophen (TYLENOL) 500 MG tablet Take by mouth.     albuterol (PROVENTIL HFA;VENTOLIN HFA) 108 (90 BASE) MCG/ACT inhaler Inhale 2 puffs into the lungs every 6 (six) hours as needed for wheezing or shortness of breath.      amitriptyline (ELAVIL) 50 MG tablet Take by mouth.     aspirin EC 81 MG tablet Take 162 mg by mouth daily with breakfast.      benzonatate (TESSALON) 100 MG capsule Take 1 capsule (100 mg total) by mouth 3 (three) times daily as needed for cough. 21 capsule 0   doxycycline (VIBRAMYCIN) 100 MG capsule Take 1 capsule (100 mg total) by mouth 2 (two) times daily. 20 capsule 0   fish oil-omega-3 fatty acids 1000 MG capsule Take 1 g by mouth daily with breakfast.      gabapentin (NEURONTIN) 300 MG capsule Take by mouth.     ibuprofen (ADVIL,MOTRIN) 200 MG tablet Take 400-600 mg by mouth every 6 (six) hours as needed for moderate pain.  Ipratropium-Albuterol (COMBIVENT RESPIMAT) 20-100 MCG/ACT AERS respimat Inhale 1 puff into the lungs every 6 (six) hours. 4 g 0   lidocaine (LIDODERM) 5 % Place onto the skin.     lisinopril (ZESTRIL) 20 MG tablet Take 1 tablet by mouth daily.     metFORMIN (GLUCOPHAGE) 1000 MG tablet Take by mouth.     predniSONE (DELTASONE) 20 MG tablet 3 tabs po day one, then 2 tabs daily x 4 days 11 tablet 0   simvastatin (ZOCOR) 40 MG tablet Take by mouth.     No current facility-administered medications for this visit.    REVIEW OF SYSTEMS:  [X]  denotes positive finding, [ ]  denotes negative finding Cardiac  Comments:  Chest pain or chest pressure: ***   Shortness of breath upon exertion:    Short of breath when lying flat:    Irregular heart rhythm:         Vascular    Pain in calf, thigh, or hip brought on by ambulation:    Pain in feet at night that wakes you up from your sleep:     Blood clot in your veins:    Leg swelling:         Pulmonary    Oxygen at home:    Productive cough:     Wheezing:         Neurologic    Sudden weakness in arms or legs:     Sudden numbness in arms or legs:     Sudden onset of difficulty speaking or slurred speech:    Temporary loss of vision in one eye:     Problems with dizziness:         Gastrointestinal    Blood in stool:     Vomited blood:         Genitourinary    Burning when urinating:     Blood in urine:        Psychiatric    Major depression:         Hematologic    Bleeding problems:    Problems with blood clotting too easily:        Skin    Rashes or ulcers:        Constitutional    Fever or chills:      PHYSICAL EXAM: There were no vitals filed for this visit.  GENERAL: The patient is a well-nourished male, in no acute distress. The vital signs are documented above. CARDIAC: There is a regular rate and rhythm.  VASCULAR: *** PULMONARY: There is good air exchange bilaterally without wheezing or rales. ABDOMEN: Soft and non-tender with normal pitched bowel sounds.  MUSCULOSKELETAL: There are no major deformities or cyanosis. NEUROLOGIC: No focal weakness or paresthesias are detected. SKIN: There are no ulcers or rashes noted. PSYCHIATRIC: The patient has a normal affect.  DATA:   ***  Assessment/Plan:  68 y.o. male, with history of hypertension, hyperlipidemia, COPD that presents for evaluation of 8 cm abdominal aortic aneurysm.  Patient is referred from the [place].   Young Hensen, MD [facility] [place] Office: [phone]

## 2023-08-21 NOTE — Progress Notes (Addendum)
 Patient name: Dennis Dominguez MRN: [id number] DOB: May 24, 1955 Sex: male  REASON FOR CONSULT: 8 cm AAA  HPI: Dennis Dominguez is a 68 y.o. male, with hx DM, HTN, HLD, COPD presents as a referral from the [place] with 8 cm AAA.  Patient states he was there for his normal physical.  He denies any associated abdominal or back pain.  He states no prior knowledge of his aneurysm.  He is here with a friend.  Initially had a ultrasound on 08/20/2023 showing a 8 cm abdominal aortic aneurysm at the [place] in [place].  He was then sent for CTA on the same day on 08/20/2023 again showing a fusiform 8 cm abdominal aortic aneurysm.  Past Medical History:  Diagnosis Date   AAA (abdominal aortic aneurysm) (HCC)    Asthma    COPD (chronic obstructive pulmonary disease) (HCC)    Diabetes mellitus without complication (HCC)    High cholesterol    Hypertension    Kidney stones    Renal disorder     Past Surgical History:  Procedure Laterality Date   lithrotripsy      Family History  Problem Relation Age of Onset   Hypertension Mother    Diabetes Mother     SOCIAL HISTORY: Social History   Socioeconomic History   Marital status: Divorced    Spouse name: Not on file   Number of children: Not on file   Years of education: Not on file   Highest education level: Not on file  Occupational History   Not on file  Tobacco Use   Smoking status: Every Day    Current packs/day: 2.00    Types: Cigarettes   Smokeless tobacco: Never  Vaping Use   Vaping status: Never Used  Substance and Sexual Activity   Alcohol use: No   Drug use: No   Sexual activity: Never  Other Topics Concern   Not on file  Social History Narrative   Not on file   Social Drivers of Health   Financial Resource Strain: Not on file  Food Insecurity: Not on file  Transportation Needs: Not on file  Physical Activity: Not on file  Stress: Not on file  Social Connections: Not on file  Intimate Partner  Violence: Not on file    Allergies  Allergen Reactions   Penicillin G Shortness Of Breath    Other Reaction(s): Edema, Dyspnea   Penicillins Anaphylaxis    Has patient had a PCN reaction causing immediate rash, facial/tongue/throat swelling, SOB or lightheadedness with hypotension: Yes Has patient had a PCN reaction causing severe rash involving mucus membranes or skin necrosis: Yes Has patient had a PCN reaction that required hospitalization: Yes Has patient had a PCN reaction occurring within the last 10 years: No If all of the above answers are "NO", then may proceed with Cephalosporin use.    Bactrim [Sulfamethoxazole-Trimethoprim] Rash    Current Outpatient Medications  Medication Sig Dispense Refill   acetaminophen (TYLENOL) 500 MG tablet Take by mouth.     albuterol (PROVENTIL HFA;VENTOLIN HFA) 108 (90 BASE) MCG/ACT inhaler Inhale 2 puffs into the lungs every 6 (six) hours as needed for wheezing or shortness of breath.      amitriptyline (ELAVIL) 50 MG tablet Take 50 mg by mouth at bedtime.     aspirin EC 81 MG tablet Take 162 mg by mouth daily with breakfast.      gabapentin (NEURONTIN) 300 MG capsule Take 300 mg by mouth 2 (  two) times daily.     Ipratropium-Albuterol (COMBIVENT RESPIMAT) 20-100 MCG/ACT AERS respimat Inhale 1 puff into the lungs every 6 (six) hours. 4 g 0   lidocaine (LIDODERM) 5 % Place onto the skin.     lisinopril (ZESTRIL) 20 MG tablet Take 1 tablet by mouth daily.     metFORMIN (GLUCOPHAGE) 1000 MG tablet Take 1,000 mg by mouth 2 (two) times daily with a meal.     simvastatin (ZOCOR) 40 MG tablet Take by mouth.     benzonatate (TESSALON) 100 MG capsule Take 1 capsule (100 mg total) by mouth 3 (three) times daily as needed for cough. (Patient not taking: Reported on 08/21/2023) 21 capsule 0   doxycycline (VIBRAMYCIN) 100 MG capsule Take 1 capsule (100 mg total) by mouth 2 (two) times daily. (Patient not taking: Reported on 08/21/2023) 20 capsule 0   fish  oil-omega-3 fatty acids 1000 MG capsule Take 1 g by mouth daily with breakfast.  (Patient not taking: Reported on 08/21/2023)     ibuprofen (ADVIL,MOTRIN) 200 MG tablet Take 400-600 mg by mouth every 6 (six) hours as needed for moderate pain.  (Patient not taking: Reported on 08/21/2023)     predniSONE (DELTASONE) 20 MG tablet 3 tabs po day one, then 2 tabs daily x 4 days (Patient not taking: Reported on 08/21/2023) 11 tablet 0   No current facility-administered medications for this visit.    REVIEW OF SYSTEMS:  [X]  denotes positive finding, [ ]  denotes negative finding Cardiac  Comments:  Chest pain or chest pressure:    Shortness of breath upon exertion:    Short of breath when lying flat:    Irregular heart rhythm:        Vascular    Pain in calf, thigh, or hip brought on by ambulation:    Pain in feet at night that wakes you up from your sleep:     Blood clot in your veins:    Leg swelling:         Pulmonary    Oxygen at home:    Productive cough:     Wheezing:         Neurologic    Sudden weakness in arms or legs:     Sudden numbness in arms or legs:     Sudden onset of difficulty speaking or slurred speech:    Temporary loss of vision in one eye:     Problems with dizziness:         Gastrointestinal    Blood in stool:     Vomited blood:         Genitourinary    Burning when urinating:     Blood in urine:        Psychiatric    Major depression:         Hematologic    Bleeding problems:    Problems with blood clotting too easily:        Skin    Rashes or ulcers:        Constitutional    Fever or chills:      PHYSICAL EXAM: Vitals:   08/21/23 1207  BP: 119/81  Pulse: 97  Resp: 20  Temp: 97.9 F (36.6 C)  TempSrc: Temporal  SpO2: 95%  Weight: 240 lb 11.2 oz (109.2 kg)  Height: 5\' 9"  (1.753 m)    GENERAL: The patient is a well-nourished male, in no acute distress. The vital signs are documented above. CARDIAC: There is a regular  rate and rhythm.   VASCULAR:  Bilateral femoral pulses palpable Bilateral DP pulses palpable PULMONARY: No respiratory distress ABDOMEN: Soft and non-tender.  No pain with palpation of aneurysm. MUSCULOSKELETAL: There are no major deformities or cyanosis. NEUROLOGIC: No focal weakness or paresthesias are detected. SKIN: There are no ulcers or rashes noted. PSYCHIATRIC: The patient has a normal affect.  DATA:   CT reviewed from the [place] with large 8.3 cm infrarenal abdominal aortic aneurysm.  There is a short neck measuring about 12 mm.  Assessment/Plan:  68 y.o. male, with hx DM, HTN, HLD, COPD, tobacco abuse presents as a referral from the [place] with 8 cm AAA.  I have reviewed his CTA and discussed that I measure his AAA at 8.3 cm on coronal and saggital imaging.  Discussed in men we repair these at greater than 5.5 cm given risk of rupture.  I would certainly recommend repair at the current size.  I discussed that at the current size over 8 cm his yearly rupture risk is likely in excess of 40 to 50%.  I discussed bilateral transfemoral access with aortobiiliac stent graft.  He does have a short infrarenal neck and a large aneurysm that will make this more technically challenging.  Fortunately the left renal comes off much higher and with the angulation I think we should be able to get seal given the neck is over 10 mm in length.  I will put this on centerline and review with our rep to ensure we have adequate landing zone.  I will get this scheduled for Monday.  Discussed some risk that we do not have proximal seal at completion requiring additional intervention in the future particularly with the right renal which is the lower.  Risk benefits discussed including risk of anesthesia, pulmonary complication including pneumonia, heart attack, stroke, renal insufficiency including dialysis and conversion to open operation and groin cutdown.   Young Hensen, MD [facility]  [place] Office: [phone]

## 2023-08-23 ENCOUNTER — Other Ambulatory Visit: Payer: Self-pay

## 2023-08-23 ENCOUNTER — Encounter (HOSPITAL_COMMUNITY): Payer: Self-pay | Admitting: Vascular Surgery

## 2023-08-23 NOTE — Progress Notes (Signed)
 Anesthesia Chart Review:  Case: [id number] Date/Time: 08/27/23 1219   Procedure: INSERTION, ENDOVASCULAR STENT GRAFT, AORTA, ABDOMINAL   Anesthesia type: General   Diagnosis: Infrarenal abdominal aortic aneurysm (AAA) without rupture (HCC) [I71.43]   Pre-op diagnosis: AAA   Location: [facility] OR ROOM 16 / [facility] OR   Surgeons: Cephus Shelling, MD       DISCUSSION: Patient is a 68 year old male scheduled for the above procedure. He was found to have an 8 cm AAA on screening US at the [facility] for smoking history. CT Abd/pelvis on 08/20/23 confirmed 8.0 x 8.1 cm fusiform infrarenal AAA. He was referred to [facility] and evaluated by Dr. Chestine Spore the following day. EVAR repair scheduled for 08/27/23.  History includes smoking, COPD, HTN, DM2, AAA, hypercholesterolemia, asthma, anxiety, nephrolithiasis.  Unable to tolerate CPAP in the past.  He is a same day work-up. He is for labs on arrival, but Creatinine 1.2 on 08/20/22, A1c 6.6% in March. H/H 13.9/42.1, PLT 408 on 12/18/22.   Anesthesia team to evaluate on the day of surgery.   VS: Ht 5\' 9"  (1.753 m)   Wt 109.2 kg   BMI 35.55 kg/m  BP Readings from Last 3 Encounters:  08/21/23 119/81  12/18/22 (!) 146/95  12/14/22 (!) 171/84   Pulse Readings from Last 3 Encounters:  08/21/23 97  12/18/22 70  12/14/22 65     PROVIDERS: Clinic, [facility]   LABS: For day of surgery as indicated. He had POC Creatinine at the [facility] on 08/20/23 with result of 1.2. A1c on 07/25/23 was 6.6%. Other labs there on 03/15/23 included BUN 13, calcium 10.4, glucose 102, sodium 136, potassium 5.0, EGFR 70, creatinine 1.13, albumin 5.0, total bilirubin 0.4, alkaline phosphatase 71, ALT 27, AST 22 PSA 0.94, TSH 1.922.   IMAGES: CTA Abd/pelvis 08/20/23 ([facility]): Impression: 1. Fusiform infrarenal abdominal aortic aneurysm measuring up to  8.0 x 8.1 cm, as seen on the ultrasound from today. Vascular  surgery has been consulted.  2. Incidental note of right hepatic artery  origin from the SMA,  an accessory left renal artery and a retroaortic left renal vein.  3. Hepatomegaly with steatosis.  4. Colonic diverticulosis.  5. Other findings as above. [See full report in Care Everywhere]   [place] Abd Aorta 08/20/23 [facility]): Impression: Abdominal aortic aneurysm measuring 8 cm. Vascular surgery  consultation recommended. The common iliac arteries were obscured by bowel gas and could not be evaluated.    Chest CT LCS 07/25/23 ([facility]): - INDEX NODULE:  Location: Right Lower Lobe  Image: 111 Series: 2  Density: Solid  Solid diameter (average): 6.5 mm (hand measurement)  Other characteristics: Lobulated.  Change, additional comments: Baseline  - OTHER NODULES: Series 2: Solid 6.2 mm right lower lobe perifissural nodule on image 79. Adjacent smaller solid right upper lobe perifissural nodule. Solid 6.1 mm right lower lobe nodule on image 105. Solid 6.1 mm right lower lobe nodule on image 114. Solid 5.6 mm anterior right lower lobe nodule on image 140. Additional smaller solid lung nodules. Calcified pulmonary granuloma in the right middle lobe on image 115.  - LUNG/AIRWAY FINDINGS: The trachea is patent. The segmental and larger bronchi are patent. Retained mucus in the right main bronchus, distal left main bronchus, bronchus intermedius, and right lower lobe bronchus. Central bronchial thickening. A few tree-in-bud opacities in the right upper lobe on images 61 and 92 of series 2. Suspected subsegmental mucous plugging and tree-in-bud opacities in the anterior and lateral  segments of the right lower lobe. Some associated hyperlucency of the anterior and lateral segments of the right lower lobe suggests air trapping. Mild dependent reticulation in the right lower lobe. No pulmonary consolidation.  - EMPHYSEMA: Mild  - HEART, MEDIASTINUM AND LYMPH NODES: The heart size is prominent. Enlarged main pulmonary artery, measuring 3.3 cm. No lymph node enlargement.  - VISUAL  CORONARY ARTERY CALCIFICATIONS: Moderate  - UPPER ABDOMEN: Hepatic steatosis.  - BONES, SOFT TISSUES, AND ADDITIONAL FINDINGS: Suspected 2.2 cm nodule of the inferior right lobe of the thyroid gland on image 77 of series 603. Right convexity curvature of the thoracic spine. Moderate degenerative changes of the spine.  Impression: LUNG-RADS: 3: Probably Benign.  RECOMMENDATION: 6 month low dose CT follow-up.  LUNG-RADS MODIFIER: S: Findings unrelated to lung cancer.  Suspected thyroid nodule. Suggest further evaluation by thyroid  ultrasound. Enlarged main pulmonary artery, consistent with  pulmonary hypertension. Hepatic steatosis.    CT C-spine 12/14/22: IMPRESSION: 1. No acute fracture or traumatic listhesis of the cervical spine. 2. Multilevel cervical spondylosis, most significant at C5-6 and C6-7, where there is at least moderate spinal canal stenosis. 3. Multilevel left-greater-than-right facet arthropathy contributes to moderate to severe neural foraminal narrowing, as detailed above.    EKG: 12/18/22: Sinus rhythm Atrial premature complex LAD, consider left anterior fascicular block RSR' in V1 or V2, right VCD or RVH No acute changes Confirmed by Afton Horse [phone]) on 12/18/2022 10:20:02 AM   CV: Echo 10/10/07: SUMMARY  -  Overall left ventricular systolic function was normal. Left ventricular ejection fraction was estimated , range being 55 % to 60 %. There was no diagnostic evidence of left ventricular regional wall motion abnormalities. Left ventricular wall thickness was mildly increased. Features were consistent with a pseudonormal left ventricular filling pattern, with concomitant abnormal relaxation and increased filling pressure.  -  Aortic valve thickness was mildly increased.  There was normal aortic valve leaflet excursion. -  The left atrium was mildly dilated.  -  There was a small pericardial effusion circumferential to the heart.    Past Medical History:   Diagnosis Date   AAA (abdominal aortic aneurysm) (HCC)    Anxiety    Arthritis    Asthma    COPD (chronic obstructive pulmonary disease) (HCC)    Diabetes mellitus without complication (HCC)    High cholesterol    Hypertension    Kidney stones    Renal disorder     Past Surgical History:  Procedure Laterality Date   lithrotripsy      MEDICATIONS: No current facility-administered medications for this encounter.    acetaminophen (TYLENOL) 500 MG tablet   albuterol (PROVENTIL HFA;VENTOLIN HFA) 108 (90 BASE) MCG/ACT inhaler   amitriptyline (ELAVIL) 50 MG tablet   aspirin EC 81 MG tablet   Cholecalciferol (VITAMIN D-3 PO)   diazepam (VALIUM) 2 MG tablet   gabapentin (NEURONTIN) 300 MG capsule   ibuprofen (ADVIL,MOTRIN) 200 MG tablet   lidocaine (LIDODERM) 5 %   lisinopril (ZESTRIL) 20 MG tablet   metFORMIN (GLUCOPHAGE) 1000 MG tablet   simvastatin (ZOCOR) 40 MG tablet    Ella Gun, PA-C Surgical Short Stay/Anesthesiology [facility] Phone [phone] [facility] Phone [phone] 08/23/2023 6:08 PM

## 2023-08-23 NOTE — Anesthesia Preprocedure Evaluation (Addendum)
 Anesthesia Evaluation  Patient identified by MRN, date of birth, ID band Patient awake    Reviewed: Allergy & Precautions, NPO status , Patient's Chart, lab work & pertinent test results  Airway Mallampati: III  TM Distance: >3 FB Neck ROM: Limited    Dental  (+) Dental Advisory Given, Edentulous Upper, Edentulous Lower   Pulmonary asthma , COPD,  COPD inhaler, Current Smoker   Pulmonary exam normal breath sounds clear to auscultation       Cardiovascular hypertension, Pt. on medications  Rhythm:Irregular Rate:Tachycardia     Neuro/Psych  PSYCHIATRIC DISORDERS Anxiety Depression    negative neurological ROS     GI/Hepatic negative GI ROS, Neg liver ROS,,,  Endo/Other  diabetes    Renal/GU Renal disease     Musculoskeletal  (+) Arthritis ,    Abdominal  (+) + obese  Peds  Hematology negative hematology ROS (+)   Anesthesia Other Findings   Reproductive/Obstetrics                             Anesthesia Physical Anesthesia Plan  ASA: 3  Anesthesia Plan: General   Post-op Pain Management: Tylenol  PO (pre-op)* and Gabapentin  PO (pre-op)*   Induction: Intravenous  PONV Risk Score and Plan: 1 and Ondansetron , Dexamethasone  and Treatment may vary due to age or medical condition  Airway Management Planned: Oral ETT and Video Laryngoscope Planned  Additional Equipment: Arterial line  Intra-op Plan:   Post-operative Plan: Extubation in OR  Informed Consent: I have reviewed the patients History and Physical, chart, labs and discussed the procedure including the risks, benefits and alternatives for the proposed anesthesia with the patient or authorized representative who has indicated his/her understanding and acceptance.     Dental advisory given  Plan Discussed with: CRNA  Anesthesia Plan Comments: (2 x PIV  Delayed to OR due to same day workup. Labs drawn at 0910 but didn't  result until >2 hrs later.   PAT note written 08/23/2023 by Ella Gun, PA-C.  )       Anesthesia Quick Evaluation

## 2023-08-23 NOTE — Progress Notes (Signed)
 PCP - [facility], [facility]  Cardiologist -   PPM/ICD - denies Device Orders - n/a Rep Notified - n/a  Chest x-ray - 12-18-22 (1 View) EKG - 12-18-22 Stress Test -  ECHO - 10-11-2007 ([facility]) Cardiac Cath -   CPAP - per patient tested many years ago unable to tolerate mask    Fasting Blood Sugar - per patient checks blood sugar once in while  Checks Blood Sugar A1c on 07-25-23 6.6 ([facility])  Blood Thinner Instructions: denies Aspirin Instructions: continue  ERAS Protcol - NPO  COVID TEST- n/a  Anesthesia review: yes  Patient verbally denies any shortness of breath, fever, cough and chest pain during phone call   -------------  SDW INSTRUCTIONS given:  Your procedure is scheduled on August 27, 2023.  Report to [facility] Main Entrance "A" at 10:00 A.M., and check in at the Admitting office.  Call this number if you have problems the morning of surgery:  [phone]   Remember:  Do not eat or drink after midnight the night before your surgery      Take these medicines the morning of surgery with A SIP OF WATER  acetaminophen (TYLENOL)   albuterol  inhaler  (MAY BRING WITH YOU) aspirin  diazepam (VALIUM)  gabapentin (NEURONTIN)   As of today, STOP taking any Aspirin (unless otherwise instructed by your surgeon) Aleve, Naproxen, Ibuprofen, Motrin, Advil, Goody's, BC's, all herbal medications, fish oil, and all vitamins.          WHAT DO I DO ABOUT MY DIABETES MEDICATION?   Do not take oral diabetes medicines metFORMIN (GLUCOPHAGE) the morning of surgery.    The day of surgery, do not take other diabetes injectables, including Byetta (exenatide), Bydureon (exenatide ER), Victoza (liraglutide), or Trulicity (dulaglutide).  If your CBG is greater than 220 mg/dL, you may take  of your sliding scale (correction) dose of insulin.   HOW TO MANAGE YOUR DIABETES BEFORE AND AFTER SURGERY  Why is it important to control my blood sugar before and after surgery? Improving  blood sugar levels before and after surgery helps healing and can limit problems. A way of improving blood sugar control is eating a healthy diet by:  Eating less sugar and carbohydrates  Increasing activity/exercise  Talking with your doctor about reaching your blood sugar goals High blood sugars (greater than 180 mg/dL) can raise your risk of infections and slow your recovery, so you will need to focus on controlling your diabetes during the weeks before surgery. Make sure that the doctor who takes care of your diabetes knows about your planned surgery including the date and location.  How do I manage my blood sugar before surgery? Check your blood sugar at least 4 times a day, starting 2 days before surgery, to make sure that the level is not too high or low.  Check your blood sugar the morning of your surgery when you wake up and every 2 hours until you get to the Short Stay unit.  If your blood sugar is less than 70 mg/dL, you will need to treat for low blood sugar: Do not take insulin. Treat a low blood sugar (less than 70 mg/dL) with  cup of clear juice (cranberry or apple), 4 glucose tablets, OR glucose gel. Recheck blood sugar in 15 minutes after treatment (to make sure it is greater than 70 mg/dL). If your blood sugar is not greater than 70 mg/dL on recheck, call [phone] for further instructions. Report your blood sugar to the  short stay nurse when you get to Short Stay.  If you are admitted to the hospital after surgery: Your blood sugar will be checked by the staff and you will probably be given insulin after surgery (instead of oral diabetes medicines) to make sure you have good blood sugar levels. The goal for blood sugar control after surgery is 80-180 mg/dL.             Do not wear jewelry, make up, or nail polish            Do not wear lotions, powders, perfumes/colognes, or deodorant.            Do not shave 48 hours prior to surgery.  Men may shave face and neck.             Do not bring valuables to the hospital.            [facility] is not responsible for any belongings or valuables.  Do NOT Smoke (Tobacco/Vaping) 24 hours prior to your procedure If you use a CPAP at night, you may bring all equipment for your overnight stay.   Contacts, glasses, dentures or bridgework may not be worn into surgery.      For patients admitted to the hospital, discharge time will be determined by your treatment team.   Patients discharged the day of surgery will not be allowed to drive home, and someone needs to stay with them for 24 hours.    Special instructions:   [place]- Preparing For Surgery  Before surgery, you can play an important role. Because skin is not sterile, your skin needs to be as free of germs as possible. You can reduce the number of germs on your skin by washing with CHG (chlorahexidine gluconate) Soap before surgery.  CHG is an antiseptic cleaner which kills germs and bonds with the skin to continue killing germs even after washing.    Oral Hygiene is also important to reduce your risk of infection.  Remember - BRUSH YOUR TEETH THE MORNING OF SURGERY WITH YOUR REGULAR TOOTHPASTE  Please do not use if you have an allergy to CHG or antibacterial soaps. If your skin becomes reddened/irritated stop using the CHG.  Do not shave (including legs and underarms) for at least 48 hours prior to first CHG shower. It is OK to shave your face.  Please follow these instructions carefully.   Shower the NIGHT BEFORE SURGERY and the MORNING OF SURGERY with DIAL Soap.   Pat yourself dry with a CLEAN TOWEL.  Wear CLEAN PAJAMAS to bed the night before surgery  Place CLEAN SHEETS on your bed the night of your first shower and DO NOT SLEEP WITH PETS.   Day of Surgery: Please shower morning of surgery  Wear Clean/Comfortable clothing the morning of surgery Do not apply any deodorants/lotions.   Remember to brush your teeth WITH YOUR REGULAR  TOOTHPASTE.   Questions were answered. Patient verbalized understanding of instructions.

## 2023-08-27 ENCOUNTER — Inpatient Hospital Stay (HOSPITAL_COMMUNITY)

## 2023-08-27 ENCOUNTER — Inpatient Hospital Stay (HOSPITAL_COMMUNITY): Admitting: Vascular Surgery

## 2023-08-27 ENCOUNTER — Encounter (HOSPITAL_COMMUNITY): Payer: Self-pay | Admitting: Vascular Surgery

## 2023-08-27 ENCOUNTER — Inpatient Hospital Stay (HOSPITAL_COMMUNITY)
Admission: RE | Admit: 2023-08-27 | Discharge: 2023-08-28 | DRG: 269 | Disposition: A | Discharge status: Home / Self Care | Attending: Vascular Surgery | Admitting: Vascular Surgery

## 2023-08-27 ENCOUNTER — Encounter (HOSPITAL_COMMUNITY)
Admission: RE | Disposition: A | Discharge status: Home / Self Care | Payer: Self-pay | Source: Home / Self Care | Attending: Vascular Surgery

## 2023-08-27 ENCOUNTER — Other Ambulatory Visit: Payer: Self-pay

## 2023-08-27 DIAGNOSIS — Z79899 Other long term (current) drug therapy: Secondary | ICD-10-CM

## 2023-08-27 DIAGNOSIS — M1990 Unspecified osteoarthritis, unspecified site: Secondary | ICD-10-CM | POA: Diagnosis present

## 2023-08-27 DIAGNOSIS — F419 Anxiety disorder, unspecified: Secondary | ICD-10-CM | POA: Diagnosis present

## 2023-08-27 DIAGNOSIS — Z833 Family history of diabetes mellitus: Secondary | ICD-10-CM | POA: Diagnosis not present

## 2023-08-27 DIAGNOSIS — I714 Abdominal aortic aneurysm, without rupture, unspecified: Secondary | ICD-10-CM | POA: Diagnosis not present

## 2023-08-27 DIAGNOSIS — I3139 Other pericardial effusion (noninflammatory): Secondary | ICD-10-CM | POA: Diagnosis present

## 2023-08-27 DIAGNOSIS — I10 Essential (primary) hypertension: Secondary | ICD-10-CM

## 2023-08-27 DIAGNOSIS — I7143 Infrarenal abdominal aortic aneurysm, without rupture: Secondary | ICD-10-CM

## 2023-08-27 DIAGNOSIS — E7800 Pure hypercholesterolemia, unspecified: Secondary | ICD-10-CM | POA: Diagnosis present

## 2023-08-27 DIAGNOSIS — Z8249 Family history of ischemic heart disease and other diseases of the circulatory system: Secondary | ICD-10-CM | POA: Diagnosis not present

## 2023-08-27 DIAGNOSIS — Z7984 Long term (current) use of oral hypoglycemic drugs: Secondary | ICD-10-CM

## 2023-08-27 DIAGNOSIS — J4489 Other specified chronic obstructive pulmonary disease: Secondary | ICD-10-CM | POA: Diagnosis present

## 2023-08-27 DIAGNOSIS — J449 Chronic obstructive pulmonary disease, unspecified: Secondary | ICD-10-CM | POA: Diagnosis not present

## 2023-08-27 DIAGNOSIS — Z7982 Long term (current) use of aspirin: Secondary | ICD-10-CM

## 2023-08-27 DIAGNOSIS — Z882 Allergy status to sulfonamides status: Secondary | ICD-10-CM

## 2023-08-27 DIAGNOSIS — F17210 Nicotine dependence, cigarettes, uncomplicated: Secondary | ICD-10-CM | POA: Diagnosis present

## 2023-08-27 DIAGNOSIS — E119 Type 2 diabetes mellitus without complications: Secondary | ICD-10-CM | POA: Diagnosis present

## 2023-08-27 DIAGNOSIS — Z880 Allergy status to penicillin: Secondary | ICD-10-CM | POA: Diagnosis not present

## 2023-08-27 DIAGNOSIS — Z87442 Personal history of urinary calculi: Secondary | ICD-10-CM

## 2023-08-27 HISTORY — PX: ABDOMINAL AORTIC ENDOVASCULAR STENT GRAFT: SHX5707

## 2023-08-27 HISTORY — DX: Unspecified osteoarthritis, unspecified site: M19.90

## 2023-08-27 HISTORY — DX: Anxiety disorder, unspecified: F41.9

## 2023-08-27 HISTORY — DX: Family history of other specified conditions: Z84.89

## 2023-08-27 HISTORY — PX: ULTRASOUND GUIDANCE FOR VASCULAR ACCESS: SHX6516

## 2023-08-27 LAB — COMPREHENSIVE METABOLIC PANEL WITH GFR
ALT: 29 U/L (ref 0–44)
AST: 22 U/L (ref 15–41)
Albumin: 3.9 g/dL (ref 3.5–5.0)
Alkaline Phosphatase: 51 U/L (ref 38–126)
Anion gap: 10 (ref 5–15)
BUN: 17 mg/dL (ref 8–23)
CO2: 23 mmol/L (ref 22–32)
Calcium: 9.6 mg/dL (ref 8.9–10.3)
Chloride: 104 mmol/L (ref 98–111)
Creatinine, Ser: 0.98 mg/dL (ref 0.61–1.24)
GFR, Estimated: >60 mL/min (ref >60)
Glucose, Bld: 143 mg/dL — ABNORMAL HIGH (ref 70–99)
Potassium: 4.1 mmol/L (ref 3.5–5.1)
Sodium: 137 mmol/L (ref 135–145)
Total Bilirubin: 0.5 mg/dL (ref 0.0–1.2)
Total Protein: 8.1 g/dL (ref 6.5–8.1)

## 2023-08-27 LAB — URINALYSIS, ROUTINE W REFLEX MICROSCOPIC
Bacteria, UA: NONE SEEN
Bilirubin Urine: NEGATIVE
Glucose, UA: NEGATIVE mg/dL
Ketones, ur: NEGATIVE mg/dL
Leukocytes,Ua: NEGATIVE
Nitrite: NEGATIVE
Protein, ur: NEGATIVE mg/dL
Specific Gravity, Urine: 1.009 (ref 1.005–1.030)
pH: 6 (ref 5.0–8.0)

## 2023-08-27 LAB — CBC
HCT: 44.2 % (ref 39.0–52.0)
Hemoglobin: 14.3 g/dL (ref 13.0–17.0)
MCH: 27.5 pg (ref 26.0–34.0)
MCHC: 32.4 g/dL (ref 30.0–36.0)
MCV: 85 fL (ref 80.0–100.0)
Platelets: 398 10*3/uL (ref 150–400)
RBC: 5.2 MIL/uL (ref 4.22–5.81)
RDW: 14.9 % (ref 11.5–15.5)
WBC: 10.1 10*3/uL (ref 4.0–10.5)
nRBC: 0 % (ref 0.0–0.2)

## 2023-08-27 LAB — APTT: aPTT: 32 s (ref 24–36)

## 2023-08-27 LAB — ABO/RH: ABO/RH(D): A NEG

## 2023-08-27 LAB — TYPE AND SCREEN
ABO/RH(D): A NEG
Antibody Screen: NEGATIVE

## 2023-08-27 LAB — POCT ACTIVATED CLOTTING TIME
Activated Clotting Time: 199 s
Activated Clotting Time: 222 s
Activated Clotting Time: 233 s
Activated Clotting Time: 250 s

## 2023-08-27 LAB — SURGICAL PCR SCREEN
MRSA, PCR: NEGATIVE
Staphylococcus aureus: NEGATIVE

## 2023-08-27 LAB — GLUCOSE, CAPILLARY
Glucose-Capillary: 145 mg/dL — ABNORMAL HIGH (ref 70–99)
Glucose-Capillary: 96 mg/dL (ref 70–99)
Glucose-Capillary: 180 mg/dL — ABNORMAL HIGH (ref 70–99)

## 2023-08-27 LAB — PROTIME-INR
INR: 1 (ref 0.8–1.2)
Prothrombin Time: 13.3 s (ref 11.4–15.2)

## 2023-08-27 SURGERY — INSERTION, ENDOVASCULAR STENT GRAFT, AORTA, ABDOMINAL
Anesthesia: General | Site: Groin

## 2023-08-27 MED ORDER — FENTANYL CITRATE (PF) 100 MCG/2ML IJ SOLN: 25.0000 ug | INTRAMUSCULAR | Status: DC | PRN

## 2023-08-27 MED ORDER — SODIUM CHLORIDE 0.9 % IV SOLN: INTRAVENOUS | Status: DC

## 2023-08-27 MED ORDER — OXYCODONE HCL 5 MG/5ML PO SOLN: 5.0000 mg | Freq: Once | ORAL | Status: DC | PRN

## 2023-08-27 MED ORDER — DOCUSATE SODIUM 100 MG PO CAPS
100.0000 mg | ORAL_CAPSULE | Freq: Every day | ORAL | Status: DC
  Administered 2023-08-28: 100 mg via ORAL
  Filled 2023-08-27: qty 1

## 2023-08-27 MED ORDER — POLYETHYLENE GLYCOL 3350 17 G PO PACK: 17.0000 g | PACK | Freq: Every day | ORAL | Status: DC | PRN

## 2023-08-27 MED ORDER — HYDROMORPHONE HCL 1 MG/ML IJ SOLN: 0.5000 mg | INTRAMUSCULAR | Status: DC | PRN

## 2023-08-27 MED ORDER — HEPARIN 6000 UNIT IRRIGATION SOLUTION
Status: DC | PRN
  Administered 2023-08-27: 1

## 2023-08-27 MED ORDER — AMITRIPTYLINE HCL 50 MG PO TABS
50.0000 mg | ORAL_TABLET | Freq: Every day | ORAL | Status: DC
  Administered 2023-08-27: 50 mg via ORAL
  Filled 2023-08-27: qty 1

## 2023-08-27 MED ORDER — ALUM & MAG HYDROXIDE-SIMETH 200-200-20 MG/5ML PO SUSP: 15.0000 mL | ORAL | Status: DC | PRN

## 2023-08-27 MED ORDER — LIDOCAINE 5 % EX PTCH: 1.0000 | MEDICATED_PATCH | Freq: Every day | CUTANEOUS | Status: DC | PRN

## 2023-08-27 MED ORDER — CHLORHEXIDINE GLUCONATE 0.12 % MT SOLN: 15.0000 mL | Freq: Once | OROMUCOSAL | Status: AC

## 2023-08-27 MED ORDER — VANCOMYCIN HCL IN DEXTROSE 1-5 GM/200ML-% IV SOLN
1000.0000 mg | Freq: Two times a day (BID) | INTRAVENOUS | Status: DC
  Administered 2023-08-27: 1000 mg via INTRAVENOUS
  Filled 2023-08-27: qty 200

## 2023-08-27 MED ORDER — FENTANYL CITRATE (PF) 250 MCG/5ML IJ SOLN
INTRAMUSCULAR | Status: DC | PRN
  Administered 2023-08-27: 100 ug via INTRAVENOUS
  Administered 2023-08-27: 150 ug via INTRAVENOUS

## 2023-08-27 MED ORDER — POTASSIUM CHLORIDE CRYS ER 20 MEQ PO TBCR: 20.0000 meq | EXTENDED_RELEASE_TABLET | Freq: Every day | ORAL | Status: DC | PRN

## 2023-08-27 MED ORDER — HYDROMORPHONE HCL 1 MG/ML IJ SOLN
INTRAMUSCULAR | Status: AC
  Filled 2023-08-27: qty 0.5

## 2023-08-27 MED ORDER — SUGAMMADEX SODIUM 200 MG/2ML IV SOLN
INTRAVENOUS | Status: DC | PRN
  Administered 2023-08-27: 300 mg via INTRAVENOUS

## 2023-08-27 MED ORDER — LACTATED RINGERS IV SOLN: INTRAVENOUS | Status: DC | PRN

## 2023-08-27 MED ORDER — SODIUM CHLORIDE 0.9 % IV SOLN: 500.0000 mL | Freq: Once | INTRAVENOUS | Status: DC | PRN

## 2023-08-27 MED ORDER — ROCURONIUM BROMIDE 10 MG/ML (PF) SYRINGE
PREFILLED_SYRINGE | INTRAVENOUS | Status: DC | PRN
  Administered 2023-08-27: 20 mg via INTRAVENOUS
  Administered 2023-08-27: 80 mg via INTRAVENOUS

## 2023-08-27 MED ORDER — ACETAMINOPHEN 325 MG RE SUPP: 325.0000 mg | RECTAL | Status: DC | PRN

## 2023-08-27 MED ORDER — GABAPENTIN 300 MG PO CAPS
300.0000 mg | ORAL_CAPSULE | Freq: Once | ORAL | Status: AC
  Administered 2023-08-27: 300 mg via ORAL
  Filled 2023-08-27: qty 1

## 2023-08-27 MED ORDER — PROPOFOL 10 MG/ML IV BOLUS
INTRAVENOUS | Status: DC | PRN
  Administered 2023-08-27: 140 mg via INTRAVENOUS
  Administered 2023-08-27: 60 mg via INTRAVENOUS

## 2023-08-27 MED ORDER — ALBUTEROL SULFATE (2.5 MG/3ML) 0.083% IN NEBU: 3.0000 mL | INHALATION_SOLUTION | Freq: Four times a day (QID) | RESPIRATORY_TRACT | Status: DC | PRN

## 2023-08-27 MED ORDER — ORAL CARE MOUTH RINSE: 15.0000 mL | Freq: Once | OROMUCOSAL | Status: AC

## 2023-08-27 MED ORDER — GABAPENTIN 300 MG PO CAPS: 300.0000 mg | ORAL_CAPSULE | Freq: Every day | ORAL | Status: DC | PRN

## 2023-08-27 MED ORDER — CLOPIDOGREL BISULFATE 75 MG PO TABS
75.0000 mg | ORAL_TABLET | Freq: Every day | ORAL | Status: DC
  Administered 2023-08-27 – 2023-08-28 (×2): 75 mg via ORAL
  Filled 2023-08-27 (×2): qty 1

## 2023-08-27 MED ORDER — ASPIRIN 81 MG PO TBEC
162.0000 mg | DELAYED_RELEASE_TABLET | Freq: Every day | ORAL | Status: DC
  Administered 2023-08-28: 162 mg via ORAL
  Filled 2023-08-27: qty 2

## 2023-08-27 MED ORDER — ASPIRIN 81 MG PO TBEC: 162.0000 mg | DELAYED_RELEASE_TABLET | Freq: Every day | ORAL | Status: DC

## 2023-08-27 MED ORDER — HYDRALAZINE HCL 20 MG/ML IJ SOLN: 5.0000 mg | INTRAMUSCULAR | Status: DC | PRN

## 2023-08-27 MED ORDER — ACETAMINOPHEN 325 MG PO TABS: 325.0000 mg | ORAL_TABLET | ORAL | Status: DC | PRN

## 2023-08-27 MED ORDER — LACTATED RINGERS IV SOLN: INTRAVENOUS | Status: DC

## 2023-08-27 MED ORDER — GUAIFENESIN-DM 100-10 MG/5ML PO SYRP: 15.0000 mL | ORAL_SOLUTION | ORAL | Status: DC | PRN

## 2023-08-27 MED ORDER — SIMVASTATIN 20 MG PO TABS
40.0000 mg | ORAL_TABLET | Freq: Every day | ORAL | Status: DC
  Administered 2023-08-27: 40 mg via ORAL
  Filled 2023-08-27: qty 2

## 2023-08-27 MED ORDER — BISACODYL 10 MG RE SUPP: 10.0000 mg | Freq: Every day | RECTAL | Status: DC | PRN

## 2023-08-27 MED ORDER — LABETALOL HCL 5 MG/ML IV SOLN: 10.0000 mg | INTRAVENOUS | Status: DC | PRN

## 2023-08-27 MED ORDER — MAGNESIUM SULFATE 2 GM/50ML IV SOLN: 2.0000 g | Freq: Every day | INTRAVENOUS | Status: DC | PRN

## 2023-08-27 MED ORDER — ALBUTEROL SULFATE HFA 108 (90 BASE) MCG/ACT IN AERS
INHALATION_SPRAY | RESPIRATORY_TRACT | Status: DC | PRN
  Administered 2023-08-27: 4 via RESPIRATORY_TRACT

## 2023-08-27 MED ORDER — ONDANSETRON HCL 4 MG/2ML IJ SOLN: 4.0000 mg | Freq: Four times a day (QID) | INTRAMUSCULAR | Status: DC | PRN

## 2023-08-27 MED ORDER — CHLORHEXIDINE GLUCONATE CLOTH 2 % EX PADS: 6.0000 | MEDICATED_PAD | Freq: Once | CUTANEOUS | Status: DC

## 2023-08-27 MED ORDER — DIAZEPAM 5 MG PO TABS
5.0000 mg | ORAL_TABLET | Freq: Four times a day (QID) | ORAL | Status: DC | PRN
  Administered 2023-08-27: 5 mg via ORAL
  Filled 2023-08-27: qty 1

## 2023-08-27 MED ORDER — VANCOMYCIN HCL 1500 MG/300ML IV SOLN
1500.0000 mg | INTRAVENOUS | Status: AC
  Administered 2023-08-27: 1500 mg via INTRAVENOUS

## 2023-08-27 MED ORDER — HEPARIN 6000 UNIT IRRIGATION SOLUTION
Status: AC
  Filled 2023-08-27: qty 500

## 2023-08-27 MED ORDER — PHENOL 1.4 % MT LIQD: 1.0000 | OROMUCOSAL | Status: DC | PRN

## 2023-08-27 MED ORDER — CHLORHEXIDINE GLUCONATE 0.12 % MT SOLN
OROMUCOSAL | Status: AC
  Administered 2023-08-27: 15 mL via OROMUCOSAL
  Filled 2023-08-27: qty 15

## 2023-08-27 MED ORDER — DIAZEPAM 2 MG PO TABS: 2.0000 mg | ORAL_TABLET | Freq: Four times a day (QID) | ORAL | Status: DC | PRN

## 2023-08-27 MED ORDER — PANTOPRAZOLE SODIUM 40 MG PO TBEC
40.0000 mg | DELAYED_RELEASE_TABLET | Freq: Every day | ORAL | Status: DC
  Administered 2023-08-28: 40 mg via ORAL
  Filled 2023-08-27: qty 1

## 2023-08-27 MED ORDER — LISINOPRIL 20 MG PO TABS
20.0000 mg | ORAL_TABLET | Freq: Every day | ORAL | Status: DC
  Administered 2023-08-28: 20 mg via ORAL
  Filled 2023-08-27: qty 1

## 2023-08-27 MED ORDER — ONDANSETRON HCL 4 MG/2ML IJ SOLN
INTRAMUSCULAR | Status: DC | PRN
  Administered 2023-08-27: 4 mg via INTRAVENOUS

## 2023-08-27 MED ORDER — HEPARIN SODIUM (PORCINE) 5000 UNIT/ML IJ SOLN
5000.0000 [IU] | Freq: Three times a day (TID) | INTRAMUSCULAR | Status: DC
  Administered 2023-08-28: 5000 [IU] via SUBCUTANEOUS
  Filled 2023-08-27: qty 1

## 2023-08-27 MED ORDER — DROPERIDOL 2.5 MG/ML IJ SOLN: 0.6250 mg | Freq: Once | INTRAMUSCULAR | Status: DC | PRN

## 2023-08-27 MED ORDER — OXYCODONE HCL 5 MG PO TABS: 5.0000 mg | ORAL_TABLET | Freq: Once | ORAL | Status: DC | PRN

## 2023-08-27 MED ORDER — ACETAMINOPHEN 500 MG PO TABS
1000.0000 mg | ORAL_TABLET | Freq: Once | ORAL | Status: AC
  Administered 2023-08-27: 1000 mg via ORAL
  Filled 2023-08-27: qty 2

## 2023-08-27 MED ORDER — DEXAMETHASONE SODIUM PHOSPHATE 10 MG/ML IJ SOLN
INTRAMUSCULAR | Status: DC | PRN
  Administered 2023-08-27: 4 mg via INTRAVENOUS

## 2023-08-27 MED ORDER — OXYCODONE-ACETAMINOPHEN 5-325 MG PO TABS: 1.0000 | ORAL_TABLET | ORAL | Status: DC | PRN

## 2023-08-27 MED ORDER — LIDOCAINE 2% (20 MG/ML) 5 ML SYRINGE
INTRAMUSCULAR | Status: DC | PRN
  Administered 2023-08-27: 100 mg via INTRAVENOUS

## 2023-08-27 MED ORDER — FENTANYL CITRATE (PF) 250 MCG/5ML IJ SOLN
INTRAMUSCULAR | Status: AC
  Filled 2023-08-27: qty 5

## 2023-08-27 MED ORDER — PHENYLEPHRINE HCL-NACL 20-0.9 MG/250ML-% IV SOLN
INTRAVENOUS | Status: DC | PRN
  Administered 2023-08-27: 50 ug/min via INTRAVENOUS

## 2023-08-27 MED ORDER — PROTAMINE SULFATE 10 MG/ML IV SOLN
INTRAVENOUS | Status: DC | PRN
  Administered 2023-08-27: 50 mg via INTRAVENOUS

## 2023-08-27 MED ORDER — HEPARIN SODIUM (PORCINE) 1000 UNIT/ML IJ SOLN
INTRAMUSCULAR | Status: DC | PRN
  Administered 2023-08-27: 11000 [IU] via INTRAVENOUS
  Administered 2023-08-27 (×2): 3000 [IU] via INTRAVENOUS
  Administered 2023-08-27: 5000 [IU] via INTRAVENOUS

## 2023-08-27 MED ORDER — IODIXANOL 320 MG/ML IV SOLN
INTRAVENOUS | Status: DC | PRN
  Administered 2023-08-27: 115.7 mL via INTRA_ARTERIAL

## 2023-08-27 MED ORDER — METOPROLOL TARTRATE 5 MG/5ML IV SOLN: 2.0000 mg | INTRAVENOUS | Status: DC | PRN

## 2023-08-27 MED ORDER — HYDROMORPHONE HCL 1 MG/ML IJ SOLN
INTRAMUSCULAR | Status: DC | PRN
  Administered 2023-08-27 (×2): .5 mg via INTRAVENOUS

## 2023-08-27 SURGICAL SUPPLY — 41 items
BAG COUNTER SPONGE SURGICOUNT (BAG) ×3 IMPLANT
CANISTER SUCT 3000ML PPV (MISCELLANEOUS) ×3 IMPLANT
CATH BEACON 5.038 65CM KMP-01 (CATHETERS) ×2 IMPLANT
CATH OMNI FLUSH .035X70CM (CATHETERS) ×3 IMPLANT
DERMABOND ADVANCED .7 DNX12 (GAUZE/BANDAGES/DRESSINGS) ×2 IMPLANT
DEVICE CLOSURE PERCLS PRGLD 6F (VASCULAR PRODUCTS) ×12 IMPLANT
ELECTRODE REM PT RTRN 9FT ADLT (ELECTROSURGICAL) ×6 IMPLANT
EXCLUDER TNK END 32X14.5X14 18 (Endovascular Graft) IMPLANT
GAUZE SPONGE 2X2 8PLY STRL LF (GAUZE/BANDAGES/DRESSINGS) ×2 IMPLANT
GLIDEWIRE ADV .035X180CM (WIRE) IMPLANT
GLIDEWIRE ADV .035X260CM (WIRE) IMPLANT
GLOVE BIO SURGEON STRL SZ7.5 (GLOVE) ×2 IMPLANT
GLOVE BIOGEL PI IND STRL 8 (GLOVE) ×2 IMPLANT
GOWN STRL REUS W/ TWL LRG LVL3 (GOWN DISPOSABLE) ×6 IMPLANT
GOWN STRL REUS W/ TWL XL LVL3 (GOWN DISPOSABLE) ×3 IMPLANT
GRAFT BALLN CATH 65CM (BALLOONS) ×2 IMPLANT
KIT BASIN OR (CUSTOM PROCEDURE TRAY) ×2 IMPLANT
KIT ENCORE 26 ADVANTAGE (KITS) IMPLANT
KIT TURNOVER KIT B (KITS) ×3 IMPLANT
LEG CONTRALATERAL 16X14.5X12 (Vascular Products) IMPLANT
NS IRRIG 1000ML POUR BTL (IV SOLUTION) ×2 IMPLANT
PACK ENDOVASCULAR (PACKS) ×2 IMPLANT
PAD ARMBOARD POSITIONER FOAM (MISCELLANEOUS) ×6 IMPLANT
PENCIL SMOKE EVACUATOR (MISCELLANEOUS) IMPLANT
SET MICROPUNCTURE 5F STIFF (MISCELLANEOUS) ×2 IMPLANT
SHEATH BRITE TIP 8FR 23CM (SHEATH) ×2 IMPLANT
SHEATH DRYSEAL FLEX 12FR 33CM (SHEATH) IMPLANT
SHEATH DRYSEAL FLEX 18FR 33CM (SHEATH) IMPLANT
SHEATH GUIDING 8.5FR 67X17 (MISCELLANEOUS) IMPLANT
SHEATH PINNACLE 5F 10CM (SHEATH) IMPLANT
SHEATH PINNACLE 8F 10CM (SHEATH) ×3 IMPLANT
STENT GRAFT CONTRALAT 16X12X14 (Vascular Products) IMPLANT
STENT VIABAHN 7X29 6FR 135 (Permanent Stent) IMPLANT
STOPCOCK MORSE 400PSI 3WAY (MISCELLANEOUS) ×2 IMPLANT
SUT MNCRL AB 4-0 PS2 18 (SUTURE) ×2 IMPLANT
SYR 20ML LL LF (SYRINGE) ×3 IMPLANT
TOWEL GREEN STERILE (TOWEL DISPOSABLE) ×3 IMPLANT
TRAY FOLEY MTR SLVR 16FR STAT (SET/KITS/TRAYS/PACK) ×2 IMPLANT
TUBING HIGH PRESSURE 120CM (CONNECTOR) ×3 IMPLANT
WIRE AMPLATZ SS-J .035X180CM (WIRE) ×6 IMPLANT
WIRE BENTSON .035X145CM (WIRE) ×4 IMPLANT

## 2023-08-27 NOTE — Op Note (Signed)
 Date: August 27, 2023  Preoperative diagnosis: 8.5 cm abdominal aortic aneurysm  Postoperative diagnosis: Same  Procedure: 1.  Ultrasound-guided access of bilateral common femoral arteries with Perclose closure (CPT 34713) 2.  Catheter selection of aorta with abdominal aortogram 3.  Aortobiiliac stent graft for treatment of abdominal aortic aneurysm (CPT 34705) 4.  Placement of extension endovascular limb (CPT [phone]) 5.  Catheter selection right renal artery with selective renal angiogram (CPT 36245) 6.  Right renal angioplasty with stent placement (CPT [phone])  Surgeon: Dr. Young Hensen, MD  Assistant: Dr. Bernadette Brewster, MD and Maryanna Smart, [place]  Indications: 68 year old male who was seen urgently in the office on Tuesday as a referral from the [place] with large 8.5 cm abdominal aortic aneurysm.  He presents today for stent graft repair after risks benefits discussed.  He does have a very short neck with tortuosity making this technically challenging and we discussed risks and benefits ahead of time.  An assistant was needed given the complexity of the case and also for wire and sheath exchange.  Findings: Ultrasound-guided access of bilateral common femoral arteries.  Perclose closure in both groins.  32 mm x 14 mm x 14 cm main body right.  This was deployed with angulation at the level of the right renal which was the lowest).  The graft encroached on the right renal and we could no longer constrain the graft.  Ultimately we finished deploying the main body including the right limb.  I then used a Oscar steerable sheath and I was able to get into the right renal with a Glidewire advantage and this was stented with a 7 x 29 VBX with good results.  We then cannulated the gate coming from the left groin and extended with a 12 mm x 14 cm limb on the left from the main body into the left common iliac with preservation of the hypogastric.  I then extended on the right with a 14 mm x 12 cm  limb into the common iliac again with preservation of the hypogastric.  I used MOB balloon and all overlapping components and distal extent of the graft were treated with balloon angioplasty.  I elected not to balloon the proximal seal zone as we already had seal after initial placement of the main body graft.  Final images showed exclusion of the aneurysm with filling of the right renal and no endoleak.  Anesthesia: General  Details: Patient was taken to the operating room after informed consent was obtained.  Placed on the operative table in supine position.  General endotracheal anesthesia was induced.  Ultimately the bilateral groins and abdominal wall were then prepped and draped standard sterile fashion.  Antibiotics were given and timeout performed.  Initially his ultrasound evaluated the right common femoral artery, it was patent, an image was saved.  This was accessed with a micro access needle placed a microwire and then I made a stab incision dissected down with a hemostat and then I placed a micro sheath and used a Bentson wire dilated with an 8 French dilator and used Perclose devices at 10:00 and 2:00 in the right common femoral artery.  We then did the same steps in the left groin by Dr. Rosalva Comber again evaluating the left common femoral artery and ultimately due to tortuosity we had to use a Glidewire advantage and finally were able to get through the left iliac artery retrograde and then dilate with an 8 French dilator and then placed one Perclose  closure.  We then upsized to 8 French sheath in both groins.  The patient was given 100 units per kilogram IV heparin .  ACT's were checked to maintain greater than 250.  Additional IV heparin  was given until he had a therapeutic ACT.  I then exchanged for Amplatz wires in the descending thoracic aorta coming from both groins.  An 72 French Gore dry seal was placed in the right common femoral artery with a 12 French Gore dry seal in the left common femoral  artery and both sheaths were advanced into the main body of the aneurysm.  I then delivered the main body graft up the right side with the pigtail in the left.  We then got our angulation to identify the lowest right renal.  I then angulated the proximal main body of the graft and initially deployed this just below the right renal which was the lowest.  The neck was very short measuring only about 11 mm.  Ultimately the graft slipped down.  Had to constrain the graft and then pushed the graft main body back up.  In the process I did angulate the graft more given the angulation in the neck of the aneurysm.  In the process of constraining and manipulating the graft we ultimately were not able to do this any longer and elected to deployed the main body of the graft.  This did encroach on the right renal artery although this did still fill some antegrade.  I then went ahead and finished deploying the main body device including the limb on the right side.  I then drove a steerable Oscar sheath into the main body of the graft and got proximal and then I was able to use a Glidewire advantage with the steerable sheath to get in the right renal.  This was then stented with a 7 x 29 VBX in order to maintain antegrade flow in the right kidney.  I did take a selective angiogram prior to stent deployment.  I was very satisfied with these results.  Dr. Rosalva Comber then came from the left groin with a buddy wire using a KMP catheter and a Glidewire advantage and we were able to get in the gate of the endograft.  We then spun the main body of the of the pigtail catheter to ensure we were in the true lumen of the endograft.  At that point time we got a retrograde sheath shot with a marker pigtail catheter to identify the left hypogastric.  On the left we then deployed a 12 mm x 14 cm limb from the main body graft into the left common iliac with preservation of the hypogastric.  We then came from the right again getting a retrograde  sheath shot and extended with a 14 mm x 12 cm limb into the common iliac with preservation of the hypogastric.  I then used my MOB balloon and then performed angioplasty of all overlapping components including the distal extent of the graft.  We then put a pigtail back up and shot a final image that showed no evidence of endoleak with appropriate exclusion of the aneurysm.  The aortobiiliac endograft was widely patent.  The right renal filled and the right renal artery stent was widely patent.  We exchanged for Bentson wires in both groins.  We then removed the 12 French sheath in the left groin by holding manual pressure and tied down the Perclose with good hemostasis.  We then did the same on the right removing  this 59 French dry seal and then tied down the Perclose's at 10:00 and 2:00.  In both groins I did get retrograde sheath shots with micro sheath before removing the wire.  We had Doppler signals in both feet that were brisk.  Protamine  was given for reversal.  We had good hemostasis and cut both sutures and then used 4-0 Monocryl and Dermabond.  Taken to recovery in stable condition.  Complication: None  Condition: Stable  Young Hensen, MD [facility] Office: [phone]   Young Hensen

## 2023-08-27 NOTE — Anesthesia Procedure Notes (Signed)
 Arterial Line Insertion Start/End4/21/2025 10:15 AM, 08/27/2023 10:28 AM Performed by: Colen Daunt, CRNA, CRNA  Patient location: Pre-op. Preanesthetic checklist: patient identified, IV checked, site marked, risks and benefits discussed, surgical consent, monitors and equipment checked, pre-op evaluation, timeout performed and anesthesia consent Lidocaine  1% used for infiltration Left, radial was placed Catheter size: 20 G Hand hygiene performed , maximum sterile barriers used  and Seldinger technique used Allen's test indicative of satisfactory collateral circulation Attempts: 2 Procedure performed using ultrasound guided technique. Ultrasound Notes:needle tip was noted to be adjacent to the nerve/plexus identified Following insertion, dressing applied and Biopatch. Post procedure assessment: normal and unchanged  Patient tolerated the procedure well with no immediate complications.

## 2023-08-27 NOTE — Transfer of Care (Signed)
 Immediate Anesthesia Transfer of Care Note  Patient: Dennis Dominguez.  Procedure(s) Performed: INSERTION, ENDOVASCULAR STENT GRAFT, AORTA, ABDOMINAL, INSERTION RIGHT RENAL STENT ULTRASOUND GUIDANCE, FOR VASCULAR ACCESS (Bilateral: Groin)  Patient Location: PACU  Anesthesia Type:General  Level of Consciousness: awake, alert , and oriented  Airway & Oxygen  Therapy: Patient Spontanous Breathing and Patient connected to nasal cannula oxygen   Post-op Assessment: Report given to RN and Post -op Vital signs reviewed and stable  Post vital signs: Reviewed and stable  Last Vitals:  Vitals Value Taken Time  BP 154/89 08/27/23 1402  Temp    Pulse 99 08/27/23 1407  Resp 16 08/27/23 1407  SpO2 97 % 08/27/23 1407  Vitals shown include unfiled device data.  Last Pain:  Vitals:   08/27/23 0935  TempSrc:   PainSc: 0-No pain      Patients Stated Pain Goal: 0 (08/27/23 0924)  Complications: No notable events documented.

## 2023-08-27 NOTE — Anesthesia Postprocedure Evaluation (Signed)
 Anesthesia Post Note  Patient: Dennis Dominguez.  Procedure(s) Performed: INSERTION, ENDOVASCULAR STENT GRAFT, AORTA, ABDOMINAL, INSERTION RIGHT RENAL STENT ULTRASOUND GUIDANCE, FOR VASCULAR ACCESS (Bilateral: Groin)     Patient location during evaluation: PACU Anesthesia Type: General Level of consciousness: sedated and patient cooperative Pain management: pain level controlled Vital Signs Assessment: post-procedure vital signs reviewed and stable Respiratory status: spontaneous breathing Cardiovascular status: stable Anesthetic complications: no  There were no known notable events for this encounter.  Last Vitals:  Vitals:   08/27/23 1538 08/27/23 1545  BP: 120/78 131/84  Pulse: 85 81  Resp: 15 17  Temp: 37.2 C   SpO2: 94% 95%    Last Pain:  Vitals:   08/27/23 1545  TempSrc:   PainSc: 0-No pain                 Gorman Laughter

## 2023-08-27 NOTE — Discharge Instructions (Signed)

## 2023-08-27 NOTE — Progress Notes (Signed)
 Dr. Joann Mu made aware of patient's elevated BP readings this morning. Ok per Dr. Joann Mu. No new orders received.

## 2023-08-27 NOTE — Progress Notes (Signed)
  Day of Surgery Note    Subjective:  resting in recovery.  No complaints   Vitals:   08/27/23 1415 08/27/23 1430  BP: (!) 146/87 135/82  Pulse: 97 92  Resp: 15 14  Temp:    SpO2: 93% 91%    Incisions:   bilateral groins soft without hematoma Extremities:  palpable DP pulses bilaterally Cardiac:  regular Lungs:  non labored Abdomen:  soft   Assessment/Plan:  This is a 68 y.o. male who is s/p  EVAR for AAA  -pt doing well in recovery.  Bilateral groins soft without hematoma and bilateral DP pulses are easily palpable.  -bed rest for 4 hours (over around 1800) -asa/statin home meds reordered -regular diet -anticipate dc tomorrow if night is uneventful -f/u has been sent to the office   Maryanna Smart, PA-C 08/27/2023 2:38 PM [phone]

## 2023-08-27 NOTE — H&P (Signed)
 History and Physical Interval Note:  08/27/2023 10:23 AM  Dennis Dominguez.  has presented today for surgery, with the diagnosis of AAA.  The various methods of treatment have been discussed with the patient and family. After consideration of risks, benefits and other options for treatment, the patient has consented to  Procedure(s): INSERTION, ENDOVASCULAR STENT GRAFT, AORTA, ABDOMINAL (N/A) as a surgical intervention.  The patient's history has been reviewed, patient examined, no change in status, stable for surgery.  I have reviewed the patient's chart and labs.  Questions were answered to the patient's satisfaction.    Discussed plan for stent graft repair of 8.5 cm infrarenal abdominal aortic aneurysm.  This has a short neck that is angulated approximately 11 to 12 mm in length.  Discussed some chance we cannot get seal proximally and would require Endo anchors and/or right renal stenting with laser fenestration.  Discussed ultimate risk of losing the right kidney leading to worsening kidney function and dialysis.  I have reviewed his images with all of my partners.  We feel this is the best plan forward.  Risks and benefits discussed.  Young Hensen     Patient name: Dennis Dominguez   MRN: [id number]        DOB: 28-May-1955          Sex: male   REASON FOR CONSULT: 8 cm AAA   HPI: Dennis Dominguez is a 68 y.o. male, with hx DM, HTN, HLD, COPD presents as a referral from the [facility] with 8 cm AAA.  Patient states he was there for his normal physical.  He denies any associated abdominal or back pain.  He states no prior knowledge of his aneurysm.  He is here with a friend.   Initially had a ultrasound on 08/20/2023 showing a 8 cm abdominal aortic aneurysm at the [facility] in [place].  He was then sent for CTA on the same day on 08/20/2023 again showing a fusiform 8 cm abdominal aortic aneurysm.       Past Medical History:  Diagnosis Date   AAA (abdominal aortic aneurysm)  (HCC)     Asthma     COPD (chronic obstructive pulmonary disease) (HCC)     Diabetes mellitus without complication (HCC)     High cholesterol     Hypertension     Kidney stones     Renal disorder                 Past Surgical History:  Procedure Laterality Date   lithrotripsy                   Family History  Problem Relation Age of Onset   Hypertension Mother     Diabetes Mother            SOCIAL HISTORY: Social History         Socioeconomic History   Marital status: Divorced      Spouse name: Not on file   Number of children: Not on file   Years of education: Not on file   Highest education level: Not on file  Occupational History   Not on file  Tobacco Use   Smoking status: Every Day      Current packs/day: 2.00      Types: Cigarettes   Smokeless tobacco: Never  Vaping Use   Vaping status: Never Used  Substance and Sexual Activity   Alcohol use: No   Drug use: No  Sexual activity: Never  Other Topics Concern   Not on file  Social History Narrative   Not on file    Social Drivers of Health    Financial Resource Strain: Not on file  Food Insecurity: Not on file  Transportation Needs: Not on file  Physical Activity: Not on file  Stress: Not on file  Social Connections: Not on file  Intimate Partner Violence: Not on file      Allergies       Allergies  Allergen Reactions   Penicillin G Shortness Of Breath      Other Reaction(s): Edema, Dyspnea   Penicillins Anaphylaxis      Has patient had a PCN reaction causing immediate rash, facial/tongue/throat swelling, SOB or lightheadedness with hypotension: Yes Has patient had a PCN reaction causing severe rash involving mucus membranes or skin necrosis: Yes Has patient had a PCN reaction that required hospitalization: Yes Has patient had a PCN reaction occurring within the last 10 years: No If all of the above answers are "NO", then may proceed with Cephalosporin use.     Bactrim   [Sulfamethoxazole -Trimethoprim ] Rash              Current Outpatient Medications  Medication Sig Dispense Refill   acetaminophen  (TYLENOL ) 500 MG tablet Take by mouth.       albuterol  (PROVENTIL  HFA;VENTOLIN  HFA) 108 (90 BASE) MCG/ACT inhaler Inhale 2 puffs into the lungs every 6 (six) hours as needed for wheezing or shortness of breath.        amitriptyline  (ELAVIL ) 50 MG tablet Take 50 mg by mouth at bedtime.       aspirin  EC 81 MG tablet Take 162 mg by mouth daily with breakfast.        gabapentin  (NEURONTIN ) 300 MG capsule Take 300 mg by mouth 2 (two) times daily.       Ipratropium-Albuterol  (COMBIVENT  RESPIMAT) 20-100 MCG/ACT AERS respimat Inhale 1 puff into the lungs every 6 (six) hours. 4 g 0   lidocaine  (LIDODERM ) 5 % Place onto the skin.       lisinopril  (ZESTRIL ) 20 MG tablet Take 1 tablet by mouth daily.       metFORMIN (GLUCOPHAGE) 1000 MG tablet Take 1,000 mg by mouth 2 (two) times daily with a meal.       simvastatin  (ZOCOR ) 40 MG tablet Take by mouth.       benzonatate  (TESSALON ) 100 MG capsule Take 1 capsule (100 mg total) by mouth 3 (three) times daily as needed for cough. (Patient not taking: Reported on 08/21/2023) 21 capsule 0   doxycycline  (VIBRAMYCIN ) 100 MG capsule Take 1 capsule (100 mg total) by mouth 2 (two) times daily. (Patient not taking: Reported on 08/21/2023) 20 capsule 0   fish oil-omega-3 fatty acids 1000 MG capsule Take 1 g by mouth daily with breakfast.  (Patient not taking: Reported on 08/21/2023)       ibuprofen  (ADVIL ,MOTRIN ) 200 MG tablet Take 400-600 mg by mouth every 6 (six) hours as needed for moderate pain.  (Patient not taking: Reported on 08/21/2023)       predniSONE  (DELTASONE ) 20 MG tablet 3 tabs po day one, then 2 tabs daily x 4 days (Patient not taking: Reported on 08/21/2023) 11 tablet 0      No current facility-administered medications for this visit.        REVIEW OF SYSTEMS:  [X]  denotes positive finding, [ ]  denotes negative  finding Cardiac   Comments:  Chest pain or chest pressure:  Shortness of breath upon exertion:      Short of breath when lying flat:      Irregular heart rhythm:             Vascular      Pain in calf, thigh, or hip brought on by ambulation:      Pain in feet at night that wakes you up from your sleep:       Blood clot in your veins:      Leg swelling:              Pulmonary      Oxygen  at home:      Productive cough:       Wheezing:              Neurologic      Sudden weakness in arms or legs:       Sudden numbness in arms or legs:       Sudden onset of difficulty speaking or slurred speech:      Temporary loss of vision in one eye:       Problems with dizziness:              Gastrointestinal      Blood in stool:       Vomited blood:              Genitourinary      Burning when urinating:       Blood in urine:             Psychiatric      Major depression:              Hematologic      Bleeding problems:      Problems with blood clotting too easily:             Skin      Rashes or ulcers:             Constitutional      Fever or chills:          PHYSICAL EXAM:    Vitals:    08/21/23 1207  BP: 119/81  Pulse: 97  Resp: 20  Temp: 97.9 F (36.6 C)  TempSrc: Temporal  SpO2: 95%  Weight: 240 lb 11.2 oz (109.2 kg)  Height: 5\' 9"  (1.753 m)      GENERAL: The patient is a well-nourished male, in no acute distress. The vital signs are documented above. CARDIAC: There is a regular rate and rhythm.  VASCULAR:  Bilateral femoral pulses palpable Bilateral DP pulses palpable PULMONARY: No respiratory distress ABDOMEN: Soft and non-tender.  No pain with palpation of aneurysm. MUSCULOSKELETAL: There are no major deformities or cyanosis. NEUROLOGIC: No focal weakness or paresthesias are detected. SKIN: There are no ulcers or rashes noted. PSYCHIATRIC: The patient has a normal affect.   DATA:    CT reviewed from the [facility] with large 8.3 cm infrarenal  abdominal aortic aneurysm.  There is a short neck measuring about 12 mm.   Assessment/Plan:   68 y.o. male, with hx DM, HTN, HLD, COPD, tobacco abuse presents as a referral from the [facility] with 8 cm AAA.  I have reviewed his CTA and discussed that I measure his AAA at 8.3 cm on coronal and saggital imaging.  Discussed in men we repair these at greater than 5.5 cm given risk of rupture.  I would certainly recommend repair at the current size.  I discussed  that at the current size over 8 cm his yearly rupture risk is likely in excess of 40 to 50%.  I discussed bilateral transfemoral access with aortobiiliac stent graft.  He does have a short infrarenal neck and a large aneurysm that will make this more technically challenging.  Fortunately the left renal comes off much higher and with the angulation I think we should be able to get seal given the neck is over 10 mm in length.  I will put this on centerline and review with our rep to ensure we have adequate landing zone.  I will get this scheduled for Monday.  Discussed some risk that we do not have proximal seal at completion requiring additional intervention in the future particularly with the right renal which is the lower.  Risk benefits discussed including risk of anesthesia, pulmonary complication including pneumonia, heart attack, stroke, renal insufficiency including dialysis and conversion to open operation and groin cutdown.     Young Hensen, MD [facility] Office: [phone]

## 2023-08-27 NOTE — Plan of Care (Signed)
  Problem: Cardiovascular: Goal: Ability to achieve and maintain adequate cardiovascular perfusion will improve Outcome: Progressing Goal: Vascular access site(s) Level 0-1 will be maintained Outcome: Progressing   Problem: Education: Goal: Understanding of CV disease, CV risk reduction, and recovery process will improve Outcome: Progressing   Problem: Activity: Goal: Ability to return to baseline activity level will improve Outcome: Progressing

## 2023-08-27 NOTE — Progress Notes (Deleted)
 Dr. Fulton Job made aware that the patient voided prior to arrival and is unable to give a urine specimen for ordered urinalysis.

## 2023-08-27 NOTE — Progress Notes (Signed)
Pt arrived from ...PACU.., A/ox ..4.pt denies any pain, MD aware,CCMD called. CHG bath given,no further needs at this time    

## 2023-08-28 ENCOUNTER — Encounter (HOSPITAL_COMMUNITY): Payer: Self-pay | Admitting: Vascular Surgery

## 2023-08-28 ENCOUNTER — Other Ambulatory Visit (HOSPITAL_COMMUNITY): Payer: Self-pay

## 2023-08-28 LAB — CBC
HCT: 37.1 % — ABNORMAL LOW (ref 39.0–52.0)
Hemoglobin: 12.1 g/dL — ABNORMAL LOW (ref 13.0–17.0)
MCH: 28 pg (ref 26.0–34.0)
MCHC: 32.6 g/dL (ref 30.0–36.0)
MCV: 85.9 fL (ref 80.0–100.0)
Platelets: 343 10*3/uL (ref 150–400)
RBC: 4.32 MIL/uL (ref 4.22–5.81)
RDW: 14.7 % (ref 11.5–15.5)
WBC: 16.3 10*3/uL — ABNORMAL HIGH (ref 4.0–10.5)
nRBC: 0 % (ref 0.0–0.2)

## 2023-08-28 LAB — BASIC METABOLIC PANEL WITH GFR
Anion gap: 10 (ref 5–15)
BUN: 17 mg/dL (ref 8–23)
CO2: 22 mmol/L (ref 22–32)
Calcium: 8.9 mg/dL (ref 8.9–10.3)
Chloride: 102 mmol/L (ref 98–111)
Creatinine, Ser: 1.09 mg/dL (ref 0.61–1.24)
GFR, Estimated: >60 mL/min (ref >60)
Glucose, Bld: 122 mg/dL — ABNORMAL HIGH (ref 70–99)
Potassium: 4.4 mmol/L (ref 3.5–5.1)
Sodium: 134 mmol/L — ABNORMAL LOW (ref 135–145)

## 2023-08-28 MED ORDER — HYDROCODONE-ACETAMINOPHEN 5-325 MG PO TABS
1.0000 | ORAL_TABLET | Freq: Four times a day (QID) | ORAL | 0 refills | Status: DC | PRN
  Filled 2023-08-28: qty 8, 2d supply, fill #0

## 2023-08-28 MED ORDER — CLOPIDOGREL BISULFATE 75 MG PO TABS
75.0000 mg | ORAL_TABLET | Freq: Every day | ORAL | 6 refills | Status: DC
  Filled 2023-08-28: qty 30, 30d supply, fill #0
  Filled 2023-09-24: qty 30, 30d supply, fill #1
  Filled 2023-10-25: qty 30, 30d supply, fill #0
  Filled 2023-11-22: qty 30, 30d supply, fill #1
  Filled 2023-12-20: qty 30, 30d supply, fill #2
  Filled 2024-01-18: qty 30, 30d supply, fill #3
  Filled 2024-02-15: qty 30, 30d supply, fill #4

## 2023-08-28 NOTE — Discharge Summary (Signed)
 [facility] Discharge Summary   Dennis Dominguez 1956/03/07 68 y.o. male  MRN: [id number]  Admission Date: 08/27/2023  Discharge Date: 08/28/2023  Physician: Young Hensen, MD  Admission Diagnosis: Infrarenal abdominal aortic aneurysm (AAA) without rupture (HCC) [I71.43] AAA (abdominal aortic aneurysm) (HCC) [I71.40]   HPI:   This is a 68 y.o. male  with hx DM, HTN, HLD, COPD presents as a referral from the [facility] with 8 cm AAA.  Patient states he was there for his normal physical.  He denies any associated abdominal or back pain.  He states no prior knowledge of his aneurysm.  He is here with a friend.   Initially had a ultrasound on 08/20/2023 showing a 8 cm abdominal aortic aneurysm at the [facility] in [place].  He was then sent for CTA on the same day on 08/20/2023 again showing a fusiform 8 cm abdominal aortic aneurysm.  Hospital Course:  The patient was admitted to the hospital and taken to the operating room on 08/27/2023 and underwent: 1.  Ultrasound-guided access of bilateral common femoral arteries with Perclose closure (CPT 34713) 2.  Catheter selection of aorta with abdominal aortogram 3.  Aortobiiliac stent graft for treatment of abdominal aortic aneurysm (CPT 34705) 4.  Placement of extension endovascular limb (CPT [phone]) 5.  Catheter selection right renal artery with selective renal angiogram (CPT 36245) 6.  Right renal angioplasty with stent placement (CPT [phone])    Findings:  Ultrasound-guided access of bilateral common femoral arteries.  Perclose closure in both groins.  32 mm x 14 mm x 14 cm main body right.  This was deployed with angulation at the level of the right renal which was the lowest).  The graft encroached on the right renal and we could no longer constrain the graft.  Ultimately we finished deploying the main body including the right limb.  I then used a Oscar steerable sheath and I was able to get into the right renal with a Glidewire  advantage and this was stented with a 7 x 29 VBX with good results.  We then cannulated the gate coming from the left groin and extended with a 12 mm x 14 cm limb on the left from the main body into the left common iliac with preservation of the hypogastric.  I then extended on the right with a 14 mm x 12 cm limb into the common iliac again with preservation of the hypogastric.  I used MOB balloon and all overlapping components and distal extent of the graft were treated with balloon angioplasty.  I elected not to balloon the proximal seal zone as we already had seal after initial placement of the main body graft.  Final images showed exclusion of the aneurysm with filling of the right renal and no endoleak.   The pt tolerated the procedure well and was transported to the PACU in good condition.   By POD 1 status post aortobiiliac stent graft for large 8.5 cm AAA including right renal artery angioplasty with stenting. Overall looks great today. No signs of hematoma in the groins. Palpable pedal pulses. Has voided. No abdominal pain. Labs are reassuring this morning and creatinine 1.09 with hemoglobin 12. Plan discharge. Discussed 1 month follow-up with CTA abdomen pelvis that we will arrange. Aspirin  and Plavix  for renal stent as we discussed.  He was able to walk and void without difficulty.     CBC    Component Value Date/Time   WBC 16.3 (H) 08/28/2023 0542  RBC 4.32 08/28/2023 0542   HGB 12.1 (L) 08/28/2023 0542   HCT 37.1 (L) 08/28/2023 0542   PLT 343 08/28/2023 0542   MCV 85.9 08/28/2023 0542   MCH 28.0 08/28/2023 0542   MCHC 32.6 08/28/2023 0542   RDW 14.7 08/28/2023 0542   LYMPHSABS 3.0 12/18/2022 0929   MONOABS 1.3 (H) 12/18/2022 0929   EOSABS 0.5 12/18/2022 0929   BASOSABS 0.1 12/18/2022 0929    BMET    Component Value Date/Time   NA 134 (L) 08/28/2023 0542   K 4.4 08/28/2023 0542   CL 102 08/28/2023 0542   CO2 22 08/28/2023 0542   GLUCOSE 122 (H) 08/28/2023 0542   BUN 17  08/28/2023 0542   CREATININE 1.09 08/28/2023 0542   CALCIUM 8.9 08/28/2023 0542   GFRNONAA >60 08/28/2023 0542   GFRAA >60 04/20/2018 1233       Discharge Instructions     Discharge patient   Complete by: As directed    Discharge pt once he has walked, voided and has meds from TOC.   Discharge disposition: 01-Home or Self Care   Discharge patient date: 08/28/2023       Discharge Diagnosis:  Infrarenal abdominal aortic aneurysm (AAA) without rupture (HCC) [I71.43] AAA (abdominal aortic aneurysm) (HCC) [I71.40]  Secondary Diagnosis: Patient Active Problem List   Diagnosis Date Noted   AAA (abdominal aortic aneurysm) (HCC) 08/27/2023   Chronic obstructive pulmonary disease (HCC) 08/21/2023   Hyperlipidemia 08/21/2023   Major depressive disorder 08/21/2023   Alcohol dependence (HCC) 08/21/2023   Benign essential hypertension 08/21/2023   Chronic neck pain 08/21/2023   Homelessness unspecified 08/21/2023   AAA (abdominal aortic aneurysm) without rupture (HCC) 08/21/2023   Type 2 diabetes mellitus (HCC) 09/15/2019   Vitamin D deficiency 09/15/2019   Past Medical History:  Diagnosis Date   AAA (abdominal aortic aneurysm) (HCC)    Anxiety    Arthritis    Asthma    COPD (chronic obstructive pulmonary disease) (HCC)    Diabetes mellitus without complication (HCC)    Family history of adverse reaction to anesthesia    Per patient it took his father "a long time to wake up from anesthesia"   High cholesterol    Hypertension    Kidney stones    Renal disorder      Allergies as of 08/28/2023       Reactions   Penicillin G Shortness Of Breath   Other Reaction(s): Edema, Dyspnea   Penicillins Anaphylaxis   Has patient had a PCN reaction causing immediate rash, facial/tongue/throat swelling, SOB or lightheadedness with hypotension: Yes Has patient had a PCN reaction causing severe rash involving mucus membranes or skin necrosis: Yes Has patient had a PCN reaction that  required hospitalization: Yes Has patient had a PCN reaction occurring within the last 10 years: No If all of the above answers are "NO", then may proceed with Cephalosporin use.   Bactrim  [sulfamethoxazole -trimethoprim ] Rash        Medication List     PAUSE taking these medications    metFORMIN 1000 MG tablet Wait to take this until: August 29, 2023 Commonly known as: GLUCOPHAGE Take 1,000 mg by mouth 2 (two) times daily with a meal.       TAKE these medications    acetaminophen  500 MG tablet Commonly known as: TYLENOL  Take 500-1,000 mg by mouth every 6 (six) hours as needed (headaches.).   albuterol  108 (90 Base) MCG/ACT inhaler Commonly known as: VENTOLIN  HFA Inhale 2 puffs  into the lungs every 6 (six) hours as needed for wheezing or shortness of breath.   amitriptyline  50 MG tablet Commonly known as: ELAVIL  Take 50 mg by mouth at bedtime.   aspirin  EC 81 MG tablet Take 162-243 mg by mouth daily with breakfast.   clopidogrel  75 MG tablet Commonly known as: PLAVIX  Take 1 tablet (75 mg total) by mouth daily.   diazepam  2 MG tablet Commonly known as: Valium  Take 1 tablet (2 mg total) by mouth every 6 (six) hours as needed for anxiety.   gabapentin  300 MG capsule Commonly known as: NEURONTIN  Take 300 mg by mouth daily as needed (pain).   HYDROcodone -acetaminophen  5-325 MG tablet Commonly known as: NORCO/VICODIN Take 1 tablet by mouth every 6 (six) hours as needed for moderate pain (pain score 4-6).   ibuprofen  200 MG tablet Commonly known as: ADVIL  Take 800 mg by mouth every 8 (eight) hours as needed (pain.).   lidocaine  5 % Commonly known as: LIDODERM  Place 1 patch onto the skin daily as needed (pain.).   lisinopril  20 MG tablet Commonly known as: ZESTRIL  Take 20 mg by mouth in the morning.   simvastatin  40 MG tablet Commonly known as: ZOCOR  Take 40 mg by mouth at bedtime.   VITAMIN D-3 PO Take 1 tablet by mouth in the morning.         Discharge Instructions:  [facility]  Discharge Instructions Endovascular Aortic Aneurysm Repair  Please refer to the following instructions for your post-procedure care. Your surgeon or Physician Assistant will discuss any changes with you.  Activity  You are encouraged to walk as much as you can. You can slowly return to normal activities but must avoid strenuous activity and heavy lifting until your doctor tells you it's OK. Avoid activities such as vacuuming or swinging a gold club. It is normal to feel tired for several weeks after your surgery. Do not drive until your doctor gives the OK and you are no longer taking prescription pain medications. It is also normal to have difficulty with sleep habits, eating, and bowel movements after surgery. These will go away with time.  Bathing/Showering  You may shower after you go home. If you have an incision, do not soak in a bathtub, hot tub, or swim until the incision heals completely.  Incision Care  Shower every day. Clean your incision with mild soap and water. Pat the area dry with a clean towel. You do not need a bandage unless otherwise instructed. Do not apply any ointments or creams to your incision. If you clothing is irritating, you may cover your incision with a dry gauze pad.  Diet  Resume your normal diet. There are no special food restrictions following this procedure. A low fat/low cholesterol diet is recommended for all patients with vascular disease. In order to heal from your surgery, it is CRITICAL to get adequate nutrition. Your body requires vitamins, minerals, and protein. Vegetables are the best source of vitamins and minerals. Vegetables also provide the perfect balance of protein. Processed food has little nutritional value, so try to avoid this.  Medications  Resume taking all of your medications unless your doctor or Physician Assistnat tells you not to. If your incision is  causing pain, you may take over-the-counter pain relievers such as acetaminophen  (Tylenol ). If you were prescribed a stronger pain medication, please be aware these medications can cause nausea and constipation. Prevent nausea by taking the medication with a snack or meal.  Avoid constipation by drinking plenty of fluids and eating foods with a high amount of fiber, such as fruits, vegetables, and grains.  Do not take Tylenol  if you are taking prescription pain medications.   Follow up  Our office will schedule a follow-up appointment with a C.T. scan 3-4 weeks after your surgery.  Please call us  immediately for any of the following conditions  Severe or worsening pain in your legs or feet or in your abdomen back or chest. Increased pain, redness, drainage (pus) from your incision sit. Increased abdominal pain, bloating, nausea, vomiting or persistent diarrhea. Fever of 101 degrees or higher. Swelling in your leg (s),  Reduce your risk of vascular disease  Stop smoking. If you would like help call [facility] at 1-800-QUIT-NOW ((918)516-8235) or [place] at [phone]. Manage your cholesterol Maintain a desired weight Control your diabetes Keep your blood pressure down  If you have questions, please call the office at [phone].   Prescriptions given: 1.  Norco #8 No Refill 2.  Plavix  75mg  daily #30 with 6 refills  Disposition: home  Patient's condition: is Good  Follow up: 1. Dr. Fulton Job in 4 weeks with CTA protocol   Maryanna Smart, PA-C [facility] [phone] 08/28/2023  7:36 AM   - For [facility]QI Registry use - Post-op:  Time to Extubation: [x]  In OR, [ ]  < 12 hrs, [ ]  12-24 hrs, [ ]  >=24 hrs Vasopressors Req. Post-op: No MI: No., [ ]  Troponin only, [ ]  EKG or Clinical New Arrhythmia: No CHF: No ICU Stay: 1 day in progressive Transfusion: No     If yes, n/a units given  Complications: Resp failure: No., [ ]  Pneumonia, [ ]  Ventilator Chg  in renal function: No., [ ]  Inc. Cr > 0.5, [ ]  Temp. Dialysis,  [ ]  Permanent dialysis Leg ischemia: No., no Surgery needed, [ ]  Yes, Surgery needed,  [ ]  Amputation Bowel ischemia: No., [ ]  Medical Rx, [ ]  Surgical Rx Wound complication: No., [ ]  Superficial separation/infection, [ ]  Return to OR Return to OR: No  Return to OR for bleeding: No Stroke: No., [ ]  Minor, [ ]  Major  Discharge medications: Statin use:  Yes  ASA use:  Yes  Plavix  use:  Yes  Beta blocker use:  No  ARB use:  No ACEI use:  Yes CCB use:  No

## 2023-08-28 NOTE — Progress Notes (Signed)
 [facility]  Subjective  -no complaints.  Wants to go home.   Objective (!) 102/56 64 97.9 F (36.6 C) (Oral) 20 95%  Intake/Output Summary (Last 24 hours) at 08/28/2023 0649 Last data filed at 08/28/2023 0353 Gross per 24 hour  Intake 1694.71 ml  Output 1600 ml  Net 94.71 ml    Bilateral groins without hematoma Bilateral DP PT pulses palpable  Laboratory Lab Results: Recent Labs    08/27/23 0910 08/28/23 0542  WBC 10.1 16.3*  HGB 14.3 12.1*  HCT 44.2 37.1*  PLT 398 343   BMET Recent Labs    08/27/23 0910 08/28/23 0542  NA 137 134*  K 4.1 4.4  CL 104 102  CO2 23 22  GLUCOSE 143* 122*  BUN 17 17  CREATININE 0.98 1.09  CALCIUM 9.6 8.9    COAG Lab Results  Component Value Date   INR 1.0 08/27/2023   No results found for: "PTT"  Assessment/Planning:  Postop day 1 status post aortobiiliac stent graft for large 8.5 cm AAA including right renal artery angioplasty with stenting.  Overall looks great today.  No signs of hematoma in the groins.  Palpable pedal pulses.  Has voided.  No abdominal pain.  Labs are reassuring this morning and creatinine 1.09 with hemoglobin 12.  Plan discharge.  Discussed 1 month follow-up with CTA abdomen pelvis that we will arrange.  Aspirin  and Plavix  for renal stent as we discussed.  Young Hensen 08/28/2023 6:49 AM --

## 2023-08-28 NOTE — TOC Transition Note (Signed)
 Transition of Care [facility]) - Discharge Note   Patient Details  Name: Dennis Dominguez. MRN: [id number] Date of Birth: Nov 22, 1955  Transition of Care [facility]) CM/SW Contact:  Omie Bickers, RN Phone Number: 08/28/2023, 8:10 AM   Clinical Narrative:     Patient was referred from office for [facility] services through [facility], they have started auth with the [facility] for this.   Final next level of care: Home w Home Health Services Barriers to Discharge: No Barriers Identified   Patient Goals and CMS Choice            Discharge Placement                       Discharge Plan and Services Additional resources added to the After Visit Summary for                              [facility] Agency: [facility] ([facility]) Date [facility] Agency Contacted: 08/28/23 Time HH Agency Contacted: [id number] Representative spoke with at [facility] Agency: Glenora Laos  Social Drivers of Health (SDOH) Interventions SDOH Screenings   Food Insecurity: No Food Insecurity (08/27/2023)  Housing: Low Risk  (08/27/2023)  Transportation Needs: No Transportation Needs (08/27/2023)  Utilities: Not At Risk (08/27/2023)  Social Connections: Socially Isolated (08/27/2023)  Tobacco Use: High Risk (08/27/2023)     Readmission Risk Interventions     No data to display

## 2023-08-29 ENCOUNTER — Other Ambulatory Visit: Payer: Self-pay

## 2023-08-29 DIAGNOSIS — I7143 Infrarenal abdominal aortic aneurysm, without rupture: Secondary | ICD-10-CM

## 2023-09-14 ENCOUNTER — Inpatient Hospital Stay: Admission: RE | Admit: 2023-09-14 | Source: Ambulatory Visit

## 2023-09-15 ENCOUNTER — Emergency Department (HOSPITAL_COMMUNITY)
Admission: EM | Admit: 2023-09-15 | Discharge: 2023-09-15 | Disposition: A | Discharge status: Home / Self Care | Payer: Self-pay | Attending: Emergency Medicine | Admitting: Emergency Medicine

## 2023-09-15 ENCOUNTER — Encounter (HOSPITAL_COMMUNITY): Payer: Self-pay | Admitting: Emergency Medicine

## 2023-09-15 ENCOUNTER — Other Ambulatory Visit: Payer: Self-pay

## 2023-09-15 DIAGNOSIS — I10 Essential (primary) hypertension: Secondary | ICD-10-CM | POA: Insufficient documentation

## 2023-09-15 DIAGNOSIS — Z79899 Other long term (current) drug therapy: Secondary | ICD-10-CM | POA: Insufficient documentation

## 2023-09-15 DIAGNOSIS — E119 Type 2 diabetes mellitus without complications: Secondary | ICD-10-CM | POA: Insufficient documentation

## 2023-09-15 DIAGNOSIS — J4489 Other specified chronic obstructive pulmonary disease: Secondary | ICD-10-CM | POA: Insufficient documentation

## 2023-09-15 DIAGNOSIS — K029 Dental caries, unspecified: Secondary | ICD-10-CM | POA: Insufficient documentation

## 2023-09-15 DIAGNOSIS — Z7982 Long term (current) use of aspirin: Secondary | ICD-10-CM | POA: Insufficient documentation

## 2023-09-15 DIAGNOSIS — Z7984 Long term (current) use of oral hypoglycemic drugs: Secondary | ICD-10-CM | POA: Insufficient documentation

## 2023-09-15 MED ORDER — CLINDAMYCIN HCL 150 MG PO CAPS: 150.0000 mg | ORAL_CAPSULE | Freq: Three times a day (TID) | ORAL | 0 refills | Status: DC

## 2023-09-15 MED ORDER — OXYCODONE HCL 5 MG PO TABS: 5.0000 mg | ORAL_TABLET | ORAL | 0 refills | Status: DC | PRN

## 2023-09-15 NOTE — ED Provider Notes (Cosign Needed Addendum)
 [facility] Provider Note   CSN: [id number] Arrival date & time: 09/15/23  0941     History  Chief Complaint  Patient presents with   Dental Pain    Dennis Forrestal. is a 68 y.o. male history of asthma, COPD, hyperlipidemia, hypertension, AAA status post endovascular stent graft 08/27/23 presents with complaints of dental pain.  Pain has been ongoing for the past week.  States he feels like tooth is broken off.  Denies any fevers or chills.  No difficulty swallowing or breathing.   Dental Pain     Past Medical History:  Diagnosis Date   AAA (abdominal aortic aneurysm) (HCC)    Anxiety    Arthritis    Asthma    COPD (chronic obstructive pulmonary disease) (HCC)    Diabetes mellitus without complication (HCC)    Family history of adverse reaction to anesthesia    Per patient it took his father "a long time to wake up from anesthesia"   High cholesterol    Hypertension    Kidney stones    Renal disorder    Past Surgical History:  Procedure Laterality Date   ABDOMINAL AORTIC ENDOVASCULAR STENT GRAFT N/A 08/27/2023   Procedure: INSERTION, ENDOVASCULAR STENT GRAFT, AORTA, ABDOMINAL, INSERTION RIGHT RENAL STENT;  Surgeon: Young Hensen, MD;  Location: [facility] OR;  Service: Vascular;  Laterality: N/A;   lithrotripsy     ULTRASOUND GUIDANCE FOR VASCULAR ACCESS Bilateral 08/27/2023   Procedure: ULTRASOUND GUIDANCE, FOR VASCULAR ACCESS;  Surgeon: Young Hensen, MD;  Location: [facility] OR;  Service: Vascular;  Laterality: Bilateral;     Home Medications Prior to Admission medications   Medication Sig Start Date End Date Taking? Authorizing Provider  clindamycin (CLEOCIN) 150 MG capsule Take 1 capsule (150 mg total) by mouth 3 (three) times daily. 09/15/23  Yes Felicie Horning, PA-C  oxyCODONE  (ROXICODONE ) 5 MG immediate release tablet Take 1 tablet (5 mg total) by mouth every 4 (four) hours as needed for severe pain (pain  score 7-10). 09/15/23  Yes Felicie Horning, PA-C  acetaminophen  (TYLENOL ) 500 MG tablet Take 500-1,000 mg by mouth every 6 (six) hours as needed (headaches.). 03/15/23   [provider]  albuterol  (PROVENTIL  HFA;VENTOLIN  HFA) 108 (90 BASE) MCG/ACT inhaler Inhale 2 puffs into the lungs every 6 (six) hours as needed for wheezing or shortness of breath.     [provider]  amitriptyline  (ELAVIL ) 50 MG tablet Take 50 mg by mouth at bedtime. 04/25/23   [provider]  aspirin  EC 81 MG tablet Take 162-243 mg by mouth daily with breakfast.    [provider]  Cholecalciferol (VITAMIN D-3 PO) Take 1 tablet by mouth in the morning.    [provider]  clopidogrel  (PLAVIX ) 75 MG tablet Take 1 tablet (75 mg total) by mouth daily. 08/28/23   Rhyne, Samantha J, PA-C  diazepam  (VALIUM ) 2 MG tablet Take 1 tablet (2 mg total) by mouth every 6 (six) hours as needed for anxiety. 08/21/23   Young Hensen, MD  gabapentin  (NEURONTIN ) 300 MG capsule Take 300 mg by mouth daily as needed (pain). 04/25/23   [provider]  HYDROcodone -acetaminophen  (NORCO/VICODIN) 5-325 MG tablet Take 1 tablet by mouth every 6 (six) hours as needed for moderate pain (pain score 4-6). 08/28/23   Rhyne, Samantha J, PA-C  ibuprofen  (ADVIL ,MOTRIN ) 200 MG tablet Take 800 mg by mouth every 8 (eight) hours as needed (pain.).  [provider]  lidocaine  (LIDODERM ) 5 % Place 1 patch onto the skin daily as needed (pain.). 04/25/23   [provider]  lisinopril  (ZESTRIL ) 20 MG tablet Take 20 mg by mouth in the morning. 04/25/23   [provider]  metFORMIN (GLUCOPHAGE) 1000 MG tablet Take 1,000 mg by mouth 2 (two) times daily with a meal. 04/25/23   [provider]  simvastatin  (ZOCOR ) 40 MG tablet Take 40 mg by mouth at bedtime. 04/25/23   [provider]      Allergies    Penicillin g, Penicillins, and Bactrim  [sulfamethoxazole -trimethoprim ]     Review of Systems   Review of Systems  HENT:  Positive for dental problem.     Physical Exam Updated Vital Signs BP (!) 152/100 (BP Location: Left Arm)   Pulse 100   Temp 98.2 F (36.8 C)   Resp 18   Ht 5\' 9"  (1.753 m)   Wt 108.9 kg   SpO2 96%   BMI 35.44 kg/m  Physical Exam Vitals and nursing note reviewed.  Constitutional:      General: He is not in acute distress.    Appearance: He is well-developed.  HENT:     Head: Normocephalic and atraumatic.     Mouth/Throat:     Comments: Patient with overall poor dentition, he is predominantly edentulous, tooth #22 with obvious dental carrie, no obvious dental abscess appreciated, no gingival swelling or drainage, there is no trismus or pooling of secretions, tolerates full range of motion of cervical spine without discomfort.  No peritonsillar abscess appreciated.  There is no submandibular swelling. Eyes:     Conjunctiva/sclera: Conjunctivae normal.  Cardiovascular:     Rate and Rhythm: Normal rate and regular rhythm.     Heart sounds: No murmur heard. Pulmonary:     Effort: Pulmonary effort is normal. No respiratory distress.     Breath sounds: Normal breath sounds.  Musculoskeletal:     Cervical back: Neck supple.  Skin:    General: Skin is warm and dry.     Capillary Refill: Capillary refill takes less than 2 seconds.  Neurological:     Mental Status: He is alert.  Psychiatric:        Mood and Affect: Mood normal.     ED Results / Procedures / Treatments   Labs (all labs ordered are listed, but only abnormal results are displayed) Labs Reviewed - No data to display  EKG None  Radiology No results found.  Procedures Procedures    Medications Ordered in ED Medications - No data to display  ED Course/ Medical Decision Making/ A&P                                 Medical Decision Making Risk Prescription drug management.   This patient presents to the ED with chief complaint(s) of dental problem.   The complaint involves an extensive differential diagnosis and also carries with it a high risk of complications and morbidity.   Pertinent past medical history as listed in HPI  The differential diagnosis includes  Dental cavity, dental fracture, dental abscess, Ludwig's angina, retropharyngeal abscess Additional history obtained: Records reviewed Care Everywhere/External Records  Assessment and management:   Patient presents hypertensive and tachycardic with complaints of dental pain.  Has been ongoing for the past week but is worsening.  Not associated with any fevers, chills, difficulty swallowing or breathing.  On exam patient  is predominantly edentulous, remaining teeth with poor dentition, tooth #22 with obvious dental carrie.  No obvious abscess appreciated.  He has no facial or submandibular swelling.  Tolerates full cervical range of motion without discomfort.  No suspicion for Ludwicg's angina.  No obvious peritonsillar abscess appreciated.  He has no trismus or pooling of secretions.  Patient did recently undergo stent for aortic aneurysm.  He is without any chest pain, shortness of breath or abdominal pain.  Given recent surgery.  Will send in prescription for Augmentin.  Have him follow-up closely with dentist.  At time of discharge, patient's heart rate was within normal limits and his blood pressure was downtrending.  Encouraged him to follow-up with his primary care doctor for further management of his blood pressure, particularly given his recent surgery.  Although I suspect these were elevated in the setting of pain.  Independent ECG interpretation:  none  Independent labs interpretation:  The following labs were independently interpreted:  none  Independent visualization and interpretation of imaging: I independently visualized the following imaging with scope of interpretation limited to determining acute life threatening conditions related to emergency care: none     Consultations obtained:   none  Disposition:   Patient will be discharged home. The patient has been appropriately medically screened and/or stabilized in the ED. I have low suspicion for any other emergent medical condition which would require further screening, evaluation or treatment in the ED or require inpatient management. At time of discharge the patient is hemodynamically stable and in no acute distress. I have discussed work-up results and diagnosis with patient and answered all questions. Patient is agreeable with discharge plan. We discussed strict return precautions for returning to the emergency department and they verbalized understanding.     Social Determinants of Health:   Patient's unhoused  increases the complexity of managing their presentation  This note was dictated with voice recognition software.  Despite best efforts at proofreading, errors may have occurred which can change the documentation meaning.          Final Clinical Impression(s) / ED Diagnoses Final diagnoses:  Dental caries    Rx / DC Orders ED Discharge Orders          Ordered    clindamycin (CLEOCIN) 150 MG capsule  3 times daily        09/15/23 1042    oxyCODONE  (ROXICODONE ) 5 MG immediate release tablet  Every 4 hours PRN        09/15/23 1042              Felicie Horning, PA-C 09/15/23 1106    Felicie Horning, PA-C 09/15/23 1106    Russella Courts A, DO 09/16/23 [phone]

## 2023-09-15 NOTE — ED Triage Notes (Signed)
 Pt reports left lower tooth has broken off and has been causing pain for the last week. Denies recent fever.

## 2023-09-15 NOTE — Discharge Instructions (Addendum)
 You were evaluated in the emergency room for dental pain.  Your exam was consistent with a notable dental cavity.  A prescription for pain medication was sent into your pharmacy in addition to antibiotics.  Please be sure to complete the full course of antibiotics.  Please follow-up with the dentist within the next week.  Contact information has been provided.  Additionally please follow-up with your primary care doctor, as you were noted  have a elevated blood pressure during your visit.  Blood pressure is important to control, particularly given your recent surgery.

## 2023-09-24 ENCOUNTER — Other Ambulatory Visit (HOSPITAL_COMMUNITY): Payer: Self-pay

## 2023-09-24 NOTE — Progress Notes (Deleted)
 Patient name: Dennis Dominguez. MRN: [id number] DOB: 02-21-1956 Sex: male  REASON FOR CONSULT: Postop status post EVAR  HPI: Dennis Dominguez. is a 68 y.o. male, with hx DM, HTN, HLD, COPD presents for postop check after EVAR on 08/27/2023 for an 8.5 cm AAA.   Past Medical History:  Diagnosis Date   AAA (abdominal aortic aneurysm) (HCC)    Anxiety    Arthritis    Asthma    COPD (chronic obstructive pulmonary disease) (HCC)    Diabetes mellitus without complication (HCC)    Family history of adverse reaction to anesthesia    Per patient it took his father "a long time to wake up from anesthesia"   High cholesterol    Hypertension    Kidney stones    Renal disorder     Past Surgical History:  Procedure Laterality Date   ABDOMINAL AORTIC ENDOVASCULAR STENT GRAFT N/A 08/27/2023   Procedure: INSERTION, ENDOVASCULAR STENT GRAFT, AORTA, ABDOMINAL, INSERTION RIGHT RENAL STENT;  Surgeon: Young Hensen, MD;  Location: [facility] OR;  Service: Vascular;  Laterality: N/A;   lithrotripsy     ULTRASOUND GUIDANCE FOR VASCULAR ACCESS Bilateral 08/27/2023   Procedure: ULTRASOUND GUIDANCE, FOR VASCULAR ACCESS;  Surgeon: Young Hensen, MD;  Location: [facility] OR;  Service: Vascular;  Laterality: Bilateral;    Family History  Problem Relation Age of Onset   Hypertension Mother    Diabetes Mother     SOCIAL HISTORY: Social History   Socioeconomic History   Marital status: Divorced    Spouse name: Not on file   Number of children: Not on file   Years of education: Not on file   Highest education level: Not on file  Occupational History   Not on file  Tobacco Use   Smoking status: Every Day    Current packs/day: 2.00    Types: Cigarettes   Smokeless tobacco: Never  Vaping Use   Vaping status: Never Used  Substance and Sexual Activity   Alcohol use: No   Drug use: No   Sexual activity: Never  Other Topics Concern   Not on file  Social History Narrative   Not on file    Social Drivers of Health   Financial Resource Strain: Not on file  Food Insecurity: No Food Insecurity (08/27/2023)   Hunger Vital Sign    Worried About Running Out of Food in the Last Year: Never true    Ran Out of Food in the Last Year: Never true  Transportation Needs: No Transportation Needs (08/27/2023)   PRAPARE - Administrator, Civil Service (Medical): No    Lack of Transportation (Non-Medical): No  Physical Activity: Not on file  Stress: Not on file  Social Connections: Socially Isolated (08/27/2023)   Social Connection and Isolation Panel [NHANES]    Frequency of Communication with Friends and Family: Once a week    Frequency of Social Gatherings with Friends and Family: Once a week    Attends Religious Services: Never    Database administrator or Organizations: No    Attends Banker Meetings: Never    Marital Status: Divorced  Catering manager Violence: Not At Risk (08/27/2023)   Humiliation, Afraid, Rape, and Kick questionnaire    Fear of Current or Ex-Partner: No    Emotionally Abused: No    Physically Abused: No    Sexually Abused: No    Allergies  Allergen Reactions   Penicillin G Shortness Of  Breath    Other Reaction(s): Edema, Dyspnea   Penicillins Anaphylaxis    Has patient had a PCN reaction causing immediate rash, facial/tongue/throat swelling, SOB or lightheadedness with hypotension: Yes Has patient had a PCN reaction causing severe rash involving mucus membranes or skin necrosis: Yes Has patient had a PCN reaction that required hospitalization: Yes Has patient had a PCN reaction occurring within the last 10 years: No If all of the above answers are "NO", then may proceed with Cephalosporin use.    Bactrim  [Sulfamethoxazole -Trimethoprim ] Rash    Current Outpatient Medications  Medication Sig Dispense Refill   acetaminophen  (TYLENOL ) 500 MG tablet Take 500-1,000 mg by mouth every 6 (six) hours as needed (headaches.).      albuterol  (PROVENTIL  HFA;VENTOLIN  HFA) 108 (90 BASE) MCG/ACT inhaler Inhale 2 puffs into the lungs every 6 (six) hours as needed for wheezing or shortness of breath.      amitriptyline  (ELAVIL ) 50 MG tablet Take 50 mg by mouth at bedtime.     aspirin  EC 81 MG tablet Take 162-243 mg by mouth daily with breakfast.     Cholecalciferol (VITAMIN D-3 PO) Take 1 tablet by mouth in the morning.     clindamycin  (CLEOCIN ) 150 MG capsule Take 1 capsule (150 mg total) by mouth 3 (three) times daily. 21 capsule 0   clopidogrel  (PLAVIX ) 75 MG tablet Take 1 tablet (75 mg total) by mouth daily. 30 tablet 6   diazepam  (VALIUM ) 2 MG tablet Take 1 tablet (2 mg total) by mouth every 6 (six) hours as needed for anxiety. 15 tablet 0   gabapentin  (NEURONTIN ) 300 MG capsule Take 300 mg by mouth daily as needed (pain).     HYDROcodone -acetaminophen  (NORCO/VICODIN) 5-325 MG tablet Take 1 tablet by mouth every 6 (six) hours as needed for moderate pain (pain score 4-6). 8 tablet 0   ibuprofen  (ADVIL ,MOTRIN ) 200 MG tablet Take 800 mg by mouth every 8 (eight) hours as needed (pain.).     lidocaine  (LIDODERM ) 5 % Place 1 patch onto the skin daily as needed (pain.).     lisinopril  (ZESTRIL ) 20 MG tablet Take 20 mg by mouth in the morning.     metFORMIN (GLUCOPHAGE) 1000 MG tablet Take 1,000 mg by mouth 2 (two) times daily with a meal.     oxyCODONE  (ROXICODONE ) 5 MG immediate release tablet Take 1 tablet (5 mg total) by mouth every 4 (four) hours as needed for severe pain (pain score 7-10). 8 tablet 0   simvastatin  (ZOCOR ) 40 MG tablet Take 40 mg by mouth at bedtime.     No current facility-administered medications for this visit.    REVIEW OF SYSTEMS:  [X]  denotes positive finding, [ ]  denotes negative finding Cardiac  Comments:  Chest pain or chest pressure:    Shortness of breath upon exertion:    Short of breath when lying flat:    Irregular heart rhythm:        Vascular    Pain in calf, thigh, or hip brought on by  ambulation:    Pain in feet at night that wakes you up from your sleep:     Blood clot in your veins:    Leg swelling:         Pulmonary    Oxygen  at home:    Productive cough:     Wheezing:         Neurologic    Sudden weakness in arms or legs:     Sudden numbness in  arms or legs:     Sudden onset of difficulty speaking or slurred speech:    Temporary loss of vision in one eye:     Problems with dizziness:         Gastrointestinal    Blood in stool:     Vomited blood:         Genitourinary    Burning when urinating:     Blood in urine:        Psychiatric    Major depression:         Hematologic    Bleeding problems:    Problems with blood clotting too easily:        Skin    Rashes or ulcers:        Constitutional    Fever or chills:      PHYSICAL EXAM: There were no vitals filed for this visit.   GENERAL: The patient is a well-nourished male, in no acute distress. The vital signs are documented above. CARDIAC: There is a regular rate and rhythm.  VASCULAR:  Bilateral femoral pulses palpable Bilateral DP pulses palpable PULMONARY: No respiratory distress ABDOMEN: Soft and non-tender.  No pain with palpation of aneurysm. MUSCULOSKELETAL: There are no major deformities or cyanosis. NEUROLOGIC: No focal weakness or paresthesias are detected. SKIN: There are no ulcers or rashes noted. PSYCHIATRIC: The patient has a normal affect.  DATA:   CT reviewed from the [place] with large 8.3 cm infrarenal abdominal aortic aneurysm.  There is a short neck measuring about 12 mm.  Assessment/Plan:  68 y.o. male, with hx DM, HTN, HLD, COPD presents for postop check after EVAR on 08/27/2023 for an 8.5 cm AAA.   Young Hensen, MD [facility] [place] Office: [phone]

## 2023-09-25 ENCOUNTER — Ambulatory Visit: Admitting: Vascular Surgery

## 2023-09-25 ENCOUNTER — Telehealth: Payer: Self-pay

## 2023-09-25 NOTE — Telephone Encounter (Signed)
 Argentina Bees, RN from [facility] called to let us  know pt will be discharged from their care by 09/28/2023.  The discharge will be done by phone with the pt.

## 2023-10-25 ENCOUNTER — Other Ambulatory Visit (HOSPITAL_COMMUNITY): Payer: Self-pay

## 2023-11-22 ENCOUNTER — Other Ambulatory Visit (HOSPITAL_COMMUNITY): Payer: Self-pay

## 2023-12-20 ENCOUNTER — Other Ambulatory Visit (HOSPITAL_COMMUNITY): Payer: Self-pay

## 2024-01-18 ENCOUNTER — Other Ambulatory Visit (HOSPITAL_COMMUNITY): Payer: Self-pay

## 2024-01-21 ENCOUNTER — Other Ambulatory Visit: Payer: Self-pay

## 2024-01-21 DIAGNOSIS — I7143 Infrarenal abdominal aortic aneurysm, without rupture: Secondary | ICD-10-CM

## 2024-02-15 ENCOUNTER — Ambulatory Visit (HOSPITAL_COMMUNITY)
Admission: RE | Admit: 2024-02-15 | Discharge: 2024-02-15 | Disposition: A | Discharge status: Home / Self Care | Source: Ambulatory Visit | Attending: Vascular Surgery | Admitting: Vascular Surgery

## 2024-02-15 ENCOUNTER — Other Ambulatory Visit (HOSPITAL_COMMUNITY): Payer: Self-pay

## 2024-02-15 DIAGNOSIS — I7143 Infrarenal abdominal aortic aneurysm, without rupture: Secondary | ICD-10-CM | POA: Insufficient documentation

## 2024-02-15 DIAGNOSIS — K5730 Diverticulosis of large intestine without perforation or abscess without bleeding: Secondary | ICD-10-CM | POA: Diagnosis not present

## 2024-02-15 DIAGNOSIS — R160 Hepatomegaly, not elsewhere classified: Secondary | ICD-10-CM | POA: Insufficient documentation

## 2024-02-15 DIAGNOSIS — N200 Calculus of kidney: Secondary | ICD-10-CM | POA: Insufficient documentation

## 2024-02-15 MED ORDER — IOHEXOL 350 MG/ML SOLN
100.0000 mL | Freq: Once | INTRAVENOUS | Status: AC | PRN
  Administered 2024-02-15: 100 mL via INTRAVENOUS

## 2024-02-25 NOTE — Progress Notes (Unsigned)
 Patient name: Dennis Dominguez. MRN: [id number] DOB: 1956/02/16 Sex: male  REASON FOR CONSULT: Postop EVAR for repair of 8.5 cm abdominal aortic aneurysm  HPI: Dennis Dominguez. is a 68 y.o. male, with hx DM, HTN, HLD, COPD presents for postop check after EVAR for repair of 8.5 cm abdominal aortic aneurysm on 08/27/2023.    Initially had a ultrasound on 08/20/2023 showing a 8 cm abdominal aortic aneurysm at the [facility] in [place].  He was then sent for CTA on the same day on 08/20/2023 again showing a fusiform 8 cm abdominal aortic aneurysm.  Past Medical History:  Diagnosis Date   AAA (abdominal aortic aneurysm)    Anxiety    Arthritis    Asthma    COPD (chronic obstructive pulmonary disease) (HCC)    Diabetes mellitus without complication (HCC)    Family history of adverse reaction to anesthesia    Per patient it took his father a long time to wake up from anesthesia   High cholesterol    Hypertension    Kidney stones    Renal disorder     Past Surgical History:  Procedure Laterality Date   ABDOMINAL AORTIC ENDOVASCULAR STENT GRAFT N/A 08/27/2023   Procedure: INSERTION, ENDOVASCULAR STENT GRAFT, AORTA, ABDOMINAL, INSERTION RIGHT RENAL STENT;  Surgeon: Gretta Lonni PARAS, MD;  Location: [facility] OR;  Service: Vascular;  Laterality: N/A;   lithrotripsy     ULTRASOUND GUIDANCE FOR VASCULAR ACCESS Bilateral 08/27/2023   Procedure: ULTRASOUND GUIDANCE, FOR VASCULAR ACCESS;  Surgeon: Gretta Lonni PARAS, MD;  Location: [facility] OR;  Service: Vascular;  Laterality: Bilateral;    Family History  Problem Relation Age of Onset   Hypertension Mother    Diabetes Mother     SOCIAL HISTORY: Social History   Socioeconomic History   Marital status: Divorced    Spouse name: Not on file   Number of children: Not on file   Years of education: Not on file   Highest education level: Not on file  Occupational History   Not on file  Tobacco Use   Smoking status: Every Day    Current  packs/day: 2.00    Types: Cigarettes   Smokeless tobacco: Never  Vaping Use   Vaping status: Never Used  Substance and Sexual Activity   Alcohol use: No   Drug use: No   Sexual activity: Never  Other Topics Concern   Not on file  Social History Narrative   Not on file   Social Drivers of Health   Financial Resource Strain: Not on file  Food Insecurity: No Food Insecurity (08/27/2023)   Hunger Vital Sign    Worried About Running Out of Food in the Last Year: Never true    Ran Out of Food in the Last Year: Never true  Transportation Needs: No Transportation Needs (08/27/2023)   PRAPARE - Administrator, Civil Service (Medical): No    Lack of Transportation (Non-Medical): No  Physical Activity: Not on file  Stress: Not on file  Social Connections: Socially Isolated (08/27/2023)   Social Connection and Isolation Panel    Frequency of Communication with Friends and Family: Once a week    Frequency of Social Gatherings with Friends and Family: Once a week    Attends Religious Services: Never    Database administrator or Organizations: No    Attends Banker Meetings: Never    Marital Status: Divorced  Catering manager Violence: Not At Risk (  08/27/2023)   Humiliation, Afraid, Rape, and Kick questionnaire    Fear of Current or Ex-Partner: No    Emotionally Abused: No    Physically Abused: No    Sexually Abused: No    Allergies  Allergen Reactions   Penicillin G Shortness Of Breath    Other Reaction(s): Edema, Dyspnea   Penicillins Anaphylaxis    Has patient had a PCN reaction causing immediate rash, facial/tongue/throat swelling, SOB or lightheadedness with hypotension: Yes Has patient had a PCN reaction causing severe rash involving mucus membranes or skin necrosis: Yes Has patient had a PCN reaction that required hospitalization: Yes Has patient had a PCN reaction occurring within the last 10 years: No If all of the above answers are NO, then may  proceed with Cephalosporin use.    Bactrim  [Sulfamethoxazole -Trimethoprim ] Rash    Current Outpatient Medications  Medication Sig Dispense Refill   acetaminophen  (TYLENOL ) 500 MG tablet Take 500-1,000 mg by mouth every 6 (six) hours as needed (headaches.).     albuterol  (PROVENTIL  HFA;VENTOLIN  HFA) 108 (90 BASE) MCG/ACT inhaler Inhale 2 puffs into the lungs every 6 (six) hours as needed for wheezing or shortness of breath.      amitriptyline  (ELAVIL ) 50 MG tablet Take 50 mg by mouth at bedtime.     aspirin  EC 81 MG tablet Take 162-243 mg by mouth daily with breakfast.     Cholecalciferol (VITAMIN D-3 PO) Take 1 tablet by mouth in the morning.     clindamycin  (CLEOCIN ) 150 MG capsule Take 1 capsule (150 mg total) by mouth 3 (three) times daily. 21 capsule 0   clopidogrel  (PLAVIX ) 75 MG tablet Take 1 tablet (75 mg total) by mouth daily. 30 tablet 6   diazepam  (VALIUM ) 2 MG tablet Take 1 tablet (2 mg total) by mouth every 6 (six) hours as needed for anxiety. 15 tablet 0   gabapentin  (NEURONTIN ) 300 MG capsule Take 300 mg by mouth daily as needed (pain).     HYDROcodone -acetaminophen  (NORCO/VICODIN) 5-325 MG tablet Take 1 tablet by mouth every 6 (six) hours as needed for moderate pain (pain score 4-6). 8 tablet 0   ibuprofen  (ADVIL ,MOTRIN ) 200 MG tablet Take 800 mg by mouth every 8 (eight) hours as needed (pain.).     lidocaine  (LIDODERM ) 5 % Place 1 patch onto the skin daily as needed (pain.).     lisinopril  (ZESTRIL ) 20 MG tablet Take 20 mg by mouth in the morning.     metFORMIN (GLUCOPHAGE) 1000 MG tablet Take 1,000 mg by mouth 2 (two) times daily with a meal.     oxyCODONE  (ROXICODONE ) 5 MG immediate release tablet Take 1 tablet (5 mg total) by mouth every 4 (four) hours as needed for severe pain (pain score 7-10). 8 tablet 0   simvastatin  (ZOCOR ) 40 MG tablet Take 40 mg by mouth at bedtime.     No current facility-administered medications for this visit.    REVIEW OF SYSTEMS:  [X]   denotes positive finding, [ ]  denotes negative finding Cardiac  Comments:  Chest pain or chest pressure:    Shortness of breath upon exertion:    Short of breath when lying flat:    Irregular heart rhythm:        Vascular    Pain in calf, thigh, or hip brought on by ambulation:    Pain in feet at night that wakes you up from your sleep:     Blood clot in your veins:    Leg swelling:  Pulmonary    Oxygen  at home:    Productive cough:     Wheezing:         Neurologic    Sudden weakness in arms or legs:     Sudden numbness in arms or legs:     Sudden onset of difficulty speaking or slurred speech:    Temporary loss of vision in one eye:     Problems with dizziness:         Gastrointestinal    Blood in stool:     Vomited blood:         Genitourinary    Burning when urinating:     Blood in urine:        Psychiatric    Major depression:         Hematologic    Bleeding problems:    Problems with blood clotting too easily:        Skin    Rashes or ulcers:        Constitutional    Fever or chills:      PHYSICAL EXAM: There were no vitals filed for this visit.   GENERAL: The patient is a well-nourished male, in no acute distress. The vital signs are documented above. CARDIAC: There is a regular rate and rhythm.  VASCULAR:  Bilateral femoral pulses palpable Bilateral DP pulses palpable PULMONARY: No respiratory distress ABDOMEN: Soft and non-tender.  No pain with palpation of aneurysm. MUSCULOSKELETAL: There are no major deformities or cyanosis. NEUROLOGIC: No focal weakness or paresthesias are detected. SKIN: There are no ulcers or rashes noted. PSYCHIATRIC: The patient has a normal affect.  DATA:   CTA reviewed 02/15/2024 with aortobiiliac stent graft with good seal proximally and right renal stent that is patent  Assessment/Plan:  68 y.o. male, with hx DM, HTN, HLD, COPD presents for postop check after EVAR for repair of 8.5 cm abdominal aortic  aneurysm on 08/27/2023.  Ultimately we have not seen him since his initial operation.  He presents for follow-up with CTA.  Stent graft is in excellent position and this did require right renal stent as we encroached on the right renal artery due to a short neck.  Right renal stent is patent and he has stable proximally.  The aneurysm sac is shrinking.  Pleased with results.  Follow-up in 6 months with EVAR duplex.   Lonni DOROTHA Gaskins, MD [facility] Office: [phone]

## 2024-02-26 ENCOUNTER — Ambulatory Visit: Attending: Vascular Surgery | Admitting: Vascular Surgery

## 2024-02-26 ENCOUNTER — Encounter: Payer: Self-pay | Admitting: Vascular Surgery

## 2024-02-26 VITALS — BP 154/93 | HR 95 | Temp 98.3°F | Resp 22 | Ht 69.0 in | Wt 237.8 lb

## 2024-02-26 DIAGNOSIS — I7143 Infrarenal abdominal aortic aneurysm, without rupture: Secondary | ICD-10-CM | POA: Diagnosis not present

## 2024-02-27 ENCOUNTER — Other Ambulatory Visit: Payer: Self-pay

## 2024-02-27 DIAGNOSIS — I7143 Infrarenal abdominal aortic aneurysm, without rupture: Secondary | ICD-10-CM

## 2024-03-20 ENCOUNTER — Other Ambulatory Visit (HOSPITAL_COMMUNITY): Payer: Self-pay

## 2024-03-20 ENCOUNTER — Other Ambulatory Visit: Payer: Self-pay | Admitting: Physician Assistant

## 2024-03-20 ENCOUNTER — Other Ambulatory Visit: Payer: Self-pay

## 2024-03-20 MED ORDER — CLOPIDOGREL BISULFATE 75 MG PO TABS
75.0000 mg | ORAL_TABLET | Freq: Every day | ORAL | 6 refills | Status: AC
  Filled 2024-03-20 (×2): qty 30, 30d supply, fill #0

## 2024-03-21 ENCOUNTER — Other Ambulatory Visit: Payer: Self-pay

## 2024-03-30 ENCOUNTER — Emergency Department (HOSPITAL_COMMUNITY)

## 2024-03-30 ENCOUNTER — Observation Stay (HOSPITAL_COMMUNITY)

## 2024-03-30 ENCOUNTER — Inpatient Hospital Stay (HOSPITAL_COMMUNITY)
Admission: EM | Admit: 2024-03-30 | Discharge: 2024-04-01 | DRG: 064 | Disposition: A | Discharge status: Home Health | Attending: Internal Medicine | Admitting: Internal Medicine

## 2024-03-30 DIAGNOSIS — J4489 Other specified chronic obstructive pulmonary disease: Secondary | ICD-10-CM | POA: Diagnosis present

## 2024-03-30 DIAGNOSIS — F419 Anxiety disorder, unspecified: Secondary | ICD-10-CM | POA: Diagnosis present

## 2024-03-30 DIAGNOSIS — Z79899 Other long term (current) drug therapy: Secondary | ICD-10-CM

## 2024-03-30 DIAGNOSIS — Z95828 Presence of other vascular implants and grafts: Secondary | ICD-10-CM

## 2024-03-30 DIAGNOSIS — F17210 Nicotine dependence, cigarettes, uncomplicated: Secondary | ICD-10-CM | POA: Diagnosis present

## 2024-03-30 DIAGNOSIS — Z8249 Family history of ischemic heart disease and other diseases of the circulatory system: Secondary | ICD-10-CM

## 2024-03-30 DIAGNOSIS — Z833 Family history of diabetes mellitus: Secondary | ICD-10-CM

## 2024-03-30 DIAGNOSIS — Z8679 Personal history of other diseases of the circulatory system: Secondary | ICD-10-CM

## 2024-03-30 DIAGNOSIS — Z7902 Long term (current) use of antithrombotics/antiplatelets: Secondary | ICD-10-CM

## 2024-03-30 DIAGNOSIS — I6329 Cerebral infarction due to unspecified occlusion or stenosis of other precerebral arteries: Principal | ICD-10-CM | POA: Diagnosis present

## 2024-03-30 DIAGNOSIS — I10 Essential (primary) hypertension: Secondary | ICD-10-CM

## 2024-03-30 DIAGNOSIS — I639 Cerebral infarction, unspecified: Principal | ICD-10-CM

## 2024-03-30 DIAGNOSIS — G8191 Hemiplegia, unspecified affecting right dominant side: Secondary | ICD-10-CM | POA: Diagnosis present

## 2024-03-30 DIAGNOSIS — F1011 Alcohol abuse, in remission: Secondary | ICD-10-CM | POA: Diagnosis present

## 2024-03-30 DIAGNOSIS — Z880 Allergy status to penicillin: Secondary | ICD-10-CM

## 2024-03-30 DIAGNOSIS — R29810 Facial weakness: Secondary | ICD-10-CM | POA: Diagnosis present

## 2024-03-30 DIAGNOSIS — G928 Other toxic encephalopathy: Secondary | ICD-10-CM | POA: Diagnosis present

## 2024-03-30 DIAGNOSIS — Z7982 Long term (current) use of aspirin: Secondary | ICD-10-CM

## 2024-03-30 DIAGNOSIS — Z7984 Long term (current) use of oral hypoglycemic drugs: Secondary | ICD-10-CM

## 2024-03-30 DIAGNOSIS — E7800 Pure hypercholesterolemia, unspecified: Secondary | ICD-10-CM | POA: Diagnosis present

## 2024-03-30 DIAGNOSIS — E785 Hyperlipidemia, unspecified: Secondary | ICD-10-CM

## 2024-03-30 DIAGNOSIS — R29702 NIHSS score 2: Secondary | ICD-10-CM

## 2024-03-30 DIAGNOSIS — R29705 NIHSS score 5: Secondary | ICD-10-CM | POA: Diagnosis present

## 2024-03-30 DIAGNOSIS — E119 Type 2 diabetes mellitus without complications: Secondary | ICD-10-CM | POA: Diagnosis present

## 2024-03-30 DIAGNOSIS — F17200 Nicotine dependence, unspecified, uncomplicated: Secondary | ICD-10-CM

## 2024-03-30 LAB — RAPID URINE DRUG SCREEN, HOSP PERFORMED
Amphetamines: NOT DETECTED
Barbiturates: NOT DETECTED
Benzodiazepines: NOT DETECTED
Cocaine: NOT DETECTED
Opiates: NOT DETECTED
Tetrahydrocannabinol: NOT DETECTED

## 2024-03-30 LAB — COMPREHENSIVE METABOLIC PANEL WITH GFR
ALT: 21 U/L (ref 0–44)
AST: 22 U/L (ref 15–41)
Albumin: 4.3 g/dL (ref 3.5–5.0)
Alkaline Phosphatase: 63 U/L (ref 38–126)
Anion gap: 14 (ref 5–15)
BUN: 12 mg/dL (ref 8–23)
CO2: 19 mmol/L — ABNORMAL LOW (ref 22–32)
Calcium: 9.9 mg/dL (ref 8.9–10.3)
Chloride: 103 mmol/L (ref 98–111)
Creatinine, Ser: 1.06 mg/dL (ref 0.61–1.24)
GFR, Estimated: >60 mL/min (ref >60)
Glucose, Bld: 95 mg/dL (ref 70–99)
Potassium: 4.6 mmol/L (ref 3.5–5.1)
Sodium: 136 mmol/L (ref 135–145)
Total Bilirubin: 0.4 mg/dL (ref 0.0–1.2)
Total Protein: 9 g/dL — ABNORMAL HIGH (ref 6.5–8.1)

## 2024-03-30 LAB — GLUCOSE, CAPILLARY: Glucose-Capillary: 113 mg/dL — ABNORMAL HIGH (ref 70–99)

## 2024-03-30 LAB — URINALYSIS, W/ REFLEX TO CULTURE (INFECTION SUSPECTED)
Bilirubin Urine: NEGATIVE
Glucose, UA: NEGATIVE mg/dL
Hgb urine dipstick: NEGATIVE
Ketones, ur: NEGATIVE mg/dL
Nitrite: NEGATIVE
Protein, ur: 30 mg/dL — AB
Specific Gravity, Urine: 1.008 (ref 1.005–1.030)
pH: 5 (ref 5.0–8.0)

## 2024-03-30 LAB — CBC
HCT: 49 % (ref 39.0–52.0)
Hemoglobin: 15.7 g/dL (ref 13.0–17.0)
MCH: 27.6 pg (ref 26.0–34.0)
MCHC: 32 g/dL (ref 30.0–36.0)
MCV: 86.1 fL (ref 80.0–100.0)
Platelets: 393 K/uL (ref 150–400)
RBC: 5.69 MIL/uL (ref 4.22–5.81)
RDW: 14.6 % (ref 11.5–15.5)
WBC: 11.2 K/uL — ABNORMAL HIGH (ref 4.0–10.5)
nRBC: 0 % (ref 0.0–0.2)

## 2024-03-30 LAB — I-STAT CHEM 8, ED
BUN: 13 mg/dL (ref 8–23)
Calcium, Ion: 1.11 mmol/L — ABNORMAL LOW (ref 1.15–1.40)
Chloride: 107 mmol/L (ref 98–111)
Creatinine, Ser: 1.1 mg/dL (ref 0.61–1.24)
Glucose, Bld: 110 mg/dL — ABNORMAL HIGH (ref 70–99)
HCT: 50 % (ref 39.0–52.0)
Hemoglobin: 17 g/dL (ref 13.0–17.0)
Potassium: 4.5 mmol/L (ref 3.5–5.1)
Sodium: 137 mmol/L (ref 135–145)
TCO2: 20 mmol/L — ABNORMAL LOW (ref 22–32)

## 2024-03-30 LAB — DIFFERENTIAL
Abs Immature Granulocytes: 0.03 K/uL (ref 0.00–0.07)
Basophils Absolute: 0.1 K/uL (ref 0.0–0.1)
Basophils Relative: 1 %
Eosinophils Absolute: 0.3 K/uL (ref 0.0–0.5)
Eosinophils Relative: 3 %
Immature Granulocytes: 0 %
Lymphocytes Relative: 21 %
Lymphs Abs: 2.4 K/uL (ref 0.7–4.0)
Monocytes Absolute: 1 K/uL (ref 0.1–1.0)
Monocytes Relative: 9 %
Neutro Abs: 7.3 K/uL (ref 1.7–7.7)
Neutrophils Relative %: 66 %

## 2024-03-30 LAB — APTT: aPTT: 35 s (ref 24–36)

## 2024-03-30 LAB — PROTIME-INR
INR: 1 (ref 0.8–1.2)
Prothrombin Time: 13.9 s (ref 11.4–15.2)

## 2024-03-30 LAB — CBG MONITORING, ED: Glucose-Capillary: 118 mg/dL — ABNORMAL HIGH (ref 70–99)

## 2024-03-30 LAB — ETHANOL: Alcohol, Ethyl (B): <15 mg/dL (ref <15)

## 2024-03-30 MED ORDER — LORAZEPAM 2 MG/ML IJ SOLN
1.0000 mg | Freq: Four times a day (QID) | INTRAMUSCULAR | Status: DC | PRN
  Administered 2024-03-30 (×2): 1 mg via INTRAVENOUS
  Filled 2024-03-30 (×2): qty 1

## 2024-03-30 MED ORDER — HYDROXYZINE HCL 10 MG PO TABS
10.0000 mg | ORAL_TABLET | Freq: Three times a day (TID) | ORAL | Status: DC | PRN
  Administered 2024-03-31 – 2024-04-01 (×2): 10 mg via ORAL
  Filled 2024-03-30 (×2): qty 1

## 2024-03-30 MED ORDER — CLOPIDOGREL BISULFATE 75 MG PO TABS
75.0000 mg | ORAL_TABLET | Freq: Every day | ORAL | Status: DC
  Administered 2024-03-30 – 2024-04-01 (×3): 75 mg via ORAL
  Filled 2024-03-30 (×3): qty 1

## 2024-03-30 MED ORDER — SODIUM CHLORIDE 0.9% FLUSH: 3.0000 mL | Freq: Once | INTRAVENOUS | Status: DC

## 2024-03-30 MED ORDER — LISINOPRIL 20 MG PO TABS
40.0000 mg | ORAL_TABLET | Freq: Every day | ORAL | Status: DC
  Administered 2024-03-30 – 2024-04-01 (×3): 40 mg via ORAL
  Filled 2024-03-30 (×3): qty 2

## 2024-03-30 MED ORDER — IOHEXOL 350 MG/ML SOLN
75.0000 mL | Freq: Once | INTRAVENOUS | Status: AC | PRN
  Administered 2024-03-30: 75 mL via INTRAVENOUS

## 2024-03-30 MED ORDER — INSULIN ASPART 100 UNIT/ML IJ SOLN: 0.0000 [IU] | Freq: Every day | INTRAMUSCULAR | Status: DC

## 2024-03-30 MED ORDER — INSULIN ASPART 100 UNIT/ML IJ SOLN
0.0000 [IU] | Freq: Three times a day (TID) | INTRAMUSCULAR | Status: DC
  Administered 2024-04-01: 2 [IU] via SUBCUTANEOUS
  Filled 2024-03-30: qty 2

## 2024-03-30 MED ORDER — STROKE: EARLY STAGES OF RECOVERY BOOK
Freq: Once | Status: AC
  Filled 2024-03-30: qty 1

## 2024-03-30 MED ORDER — ASPIRIN 81 MG PO TBEC: 162.0000 mg | DELAYED_RELEASE_TABLET | Freq: Every day | ORAL | Status: DC

## 2024-03-30 MED ORDER — HYDRALAZINE HCL 10 MG PO TABS
10.0000 mg | ORAL_TABLET | Freq: Three times a day (TID) | ORAL | Status: DC | PRN
Start: 2024-03-30 — End: 2024-03-31

## 2024-03-30 MED ORDER — AMLODIPINE BESYLATE 5 MG PO TABS
5.0000 mg | ORAL_TABLET | Freq: Every day | ORAL | Status: DC
  Administered 2024-03-30 – 2024-04-01 (×3): 5 mg via ORAL
  Filled 2024-03-30 (×3): qty 1

## 2024-03-30 MED ORDER — ROSUVASTATIN CALCIUM 20 MG PO TABS
40.0000 mg | ORAL_TABLET | Freq: Every day | ORAL | Status: DC
  Administered 2024-03-30 – 2024-04-01 (×3): 40 mg via ORAL
  Filled 2024-03-30 (×3): qty 2

## 2024-03-30 MED ORDER — ENOXAPARIN SODIUM 40 MG/0.4ML IJ SOSY
40.0000 mg | PREFILLED_SYRINGE | INTRAMUSCULAR | Status: DC
  Administered 2024-03-31 – 2024-04-01 (×2): 40 mg via SUBCUTANEOUS
  Filled 2024-03-30 (×2): qty 0.4

## 2024-03-30 MED ORDER — ACETAMINOPHEN 325 MG PO TABS
650.0000 mg | ORAL_TABLET | Freq: Four times a day (QID) | ORAL | Status: DC | PRN
  Administered 2024-03-30 – 2024-04-01 (×2): 650 mg via ORAL
  Filled 2024-03-30 (×2): qty 2

## 2024-03-30 NOTE — ED Notes (Signed)
 Patient transported to MRI

## 2024-03-30 NOTE — Progress Notes (Signed)
 SLP Cancellation Note  Patient Details Name: Dennis Dominguez. MRN: [id number] DOB: [birth year]   Cancelled treatment:       Reason Eval/Treat Not Completed: Patient at procedure or test/unavailable  Rea Pass MA, CCC-SLP  [name] [name] 03/30/2024, 3:44 PM

## 2024-03-30 NOTE — H&P (Cosign Needed Addendum)
 Date: 03/30/2024               Patient Name:  Dennis Dominguez. MRN: [id number]  DOB: 1955/07/14 Age / Sex: 68 y.o., male   PCP: [name]         Medical Service: Internal Medicine Teaching Service         Attending Physician: Dr. Jone Dauphin      First Contact: Schuyler Novak, DO    Second Contact: Dr. Missy Sandhoff, MD         Pager Information: First Contact Pager: [phone]   Second Contact Pager: [phone]   SUBJECTIVE   Chief Complaint: Stroke   History of Present Illness: Dennis Mccalla. is a 68 y.o. male with PMH of AAA s/p endovascular stent graft, HTN/HLD, T2DM, and AUD in remission who presents with a 2-day history of general and right-sided weakness and who is admitted for stroke workup.    Patient reports he was in his usual state of health until Friday 11/21 when he saw his PCP and received the COVID booster. After the shot, he began to feel a little bit fatigued and weak but did not seek care as he ascribed these symptoms to the shot. On Saturday, his weakness persisted and he felt he was talking differently but stayed in bed all day to rest. He described the weakness as affecting his face, legs and arms but is unsure now exactly which side is worse. He thinks it may be his right side. This morning, when the weakness persisted and he continued to notice speech differences from his baseline, he was told by a friend to come to the hospital. He noticed since Friday, he is having urinary urgency and has found it difficult to make it to the bathroom in time due to this new weakness.  His last known normal was at 3pm on Friday 11/21. Patient denies trauma, chest pain, headache, shortness of breath, fecal blood or fecal incontinence, changes to vision, nausea, or vomiting.   ED Course: Upon arrival to ED, patient presented with right-sided facial droop, numbness and weakness in right arm and right leg and dizziness. ED called code stroke and obtained a CT  Head which revealed indeterminate small vessel disease involving the right thalamus. No hemorrhage or cortical infarct appreciated.   Vitals: BP 179/136  Pulse 101  Temp 98 F  Resp 18  SpO2 97%  Labs: CBC: WBC 11.4  CMP: Bicarb 19; Total Protein 9   CBG 118  ETOH negative  UDS negative Imaging: CT Head  Consulted: Neuro, IMTS  Meds:  Patient reports he is taking all his prescribed medicines and does not believe he has missed any doses. Lisinopril  40mg  Amlodipine  10mg  Rosuvastatin  40mg  Clopidogrel  75mg   Cyclobenzaprine 10mg  Metformin  Past Medical History Past Medical History:  Diagnosis Date   AAA (abdominal aortic aneurysm)    Anxiety    Arthritis    Asthma    COPD (chronic obstructive pulmonary disease) (HCC)    Diabetes mellitus without complication (HCC)    Family history of adverse reaction to anesthesia    Per patient it took his father a long time to wake up from anesthesia   High cholesterol    Hypertension    Kidney stones    Renal disorder    Past Surgical History Past Surgical History:  Procedure Laterality Date   ABDOMINAL AORTIC ENDOVASCULAR STENT GRAFT N/A 08/27/2023   Procedure: INSERTION, ENDOVASCULAR STENT GRAFT, AORTA, ABDOMINAL, INSERTION RIGHT  RENAL STENT;  Surgeon: Gretta Lonni PARAS, MD;  Location: [facility] OR;  Service: Vascular;  Laterality: N/A;   lithrotripsy     ULTRASOUND GUIDANCE FOR VASCULAR ACCESS Bilateral 08/27/2023   Procedure: ULTRASOUND GUIDANCE, FOR VASCULAR ACCESS;  Surgeon: Gretta Lonni PARAS, MD;  Location: [facility] OR;  Service: Vascular;  Laterality: Bilateral;   Social:  Lives With: Occupation: Support: Level of Function: PCP:  [facility]  Substances: - Tobacco: 50-100 pack year history - Alcohol: Used alcohol for 50 years, stopped drinking 3 years ago - Recreational Drug: Remote use of cocaine, cannabis in youth but nothing in decades. No history of IV drug use.  Family History:  Family History  Problem  Relation Age of Onset   Hypertension Mother    Diabetes Mother      Allergies: Allergies as of 03/30/2024 - Reviewed 03/30/2024  Allergen Reaction Noted   Penicillin g Shortness Of Breath 12/17/2007   Penicillins Anaphylaxis 06/05/2011   Bactrim  [sulfamethoxazole -trimethoprim ] Rash 07/20/2015    Review of Systems: A complete ROS was negative except as per HPI.   OBJECTIVE:   Physical Exam: Blood pressure (!) 159/101, pulse 99, temperature 99 F (37.2 C), temperature source Oral, resp. rate (!) 31, SpO2 96%.   Constitutional: Disheveled-appearing male patient sitting up in hospital bed, in no acute distress.  HEENT: normocephalic atraumatic, mucous membranes moist Eyes: conjunctiva non-erythematous Neck: supple Cardiovascular: regular rate and rhythm, bilateral radial pulses 2+, bilateral dorsal pedal pulses 2+ Pulmonary/Chest: normal work of breathing on room air, lungs clear to auscultation bilaterally Abdominal: soft, non-tender, non-distended MSK: normal bulk and tone. Neuro: *MS: Alert and oriented to all but location.  Speech: mild dysarthria but no aphasia, able to name and repeat. CN:    I: Deferred   II,III: PERRLA, VFF by confrontation, optic discs not visualized 2/2 pupillary constriction   III,IV,VI: EOMI w/o nystagmus, no ptosis   V: Sensation intact from V1 to V3 to LT   VII: Eyelid closure was full.  Patient refused to smile.   VIII: Hearing intact to voice   IX,X: Voice slightly dysarthric , palate elevates symmetrically    XI: SCM/trap 5/5 bilat   XII: Tongue protrudes midline, no atrophy or fasciculations  *Motor:   Normal bulk.  No tremor, rigidity or bradykinesia. Strength in RUE 4/5; LUE 5/5; RLE 4/5: LLE 5/5  Sensory: Intact to light touch bilateral face, upper, and lower extremities Gait: deferred Skin: warm and dry, no ulcers or lesions on bilateral feet Psych: mood anxious, behavior normal, thought content normal, judgement normal    Labs: CBC    Component Value Date/Time   WBC 11.2 (H) 03/30/2024 1141   RBC 5.69 03/30/2024 1141   HGB 17.0 03/30/2024 1155   HCT 50.0 03/30/2024 1155   PLT 393 03/30/2024 1141   MCV 86.1 03/30/2024 1141   MCH 27.6 03/30/2024 1141   MCHC 32.0 03/30/2024 1141   RDW 14.6 03/30/2024 1141   LYMPHSABS 2.4 03/30/2024 1141   MONOABS 1.0 03/30/2024 1141   EOSABS 0.3 03/30/2024 1141   BASOSABS 0.1 03/30/2024 1141     CMP     Component Value Date/Time   NA 137 03/30/2024 1155   K 4.5 03/30/2024 1155   CL 107 03/30/2024 1155   CO2 19 (L) 03/30/2024 1141   GLUCOSE 110 (H) 03/30/2024 1155   BUN 13 03/30/2024 1155   CREATININE 1.10 03/30/2024 1155   CALCIUM  9.9 03/30/2024 1141   PROT 9.0 (H) 03/30/2024 1141   ALBUMIN  4.3 03/30/2024 1141   AST 22 03/30/2024 1141   ALT 21 03/30/2024 1141   ALKPHOS 63 03/30/2024 1141   BILITOT 0.4 03/30/2024 1141   GFRNONAA >60 03/30/2024 1141   GFRAA >60 04/20/2018 1233    Imaging: CT HEAD CODE STROKE WO CONTRAST Result Date: 03/30/2024 EXAM: CT HEAD WITHOUT 03/30/2024 11:52:00 AM TECHNIQUE: CT of the head was performed without the administration of intravenous contrast. Automated exposure control, iterative reconstruction, and/or weight based adjustment of the mA/kV was utilized to reduce the radiation dose to as low as reasonably achievable. COMPARISON: Temporal bone CT 12/20/2009. CLINICAL HISTORY: 68 year old male with acute neuro deficit, stroke suspected. FINDINGS: BRAIN AND VENTRICLES: No acute intracranial hemorrhage. No mass effect or midline shift. No extra-axial fluid collection. No evidence of acute infarct. No hydrocephalus. Patchy and moderate asymmetric mostly periventricular white matter hypodensity in both hemispheres. Small age indeterminate right caudate lacunar infarct series 2 image 18. Similar small age indeterminate hypodensity in the central right thalamus on image 16. No suspicious intracranial vascular hyperdensity. On  sagittal image 25 the caudate lacune appears circumscribed and chronic. Calcified atherosclerosis at the skull base. No gaze deviation. ORBITS: No acute abnormality. SINUSES AND MASTOIDS: Chronic appearing right maxillary sinus disease with mucoperiosteal thickening. Trace fluid in the left maxillary sinus. Other paranasal sinuses, middle ears and mastoids are well aerated. SOFT TISSUES AND SKULL: No acute skull fracture. No acute soft tissue abnormality. [place] Stroke Program Early CT Score (ASPECTS) ----- Ganglionic (caudate, ic, lentiform nucleus, insula, M1-m3): 7 Supraganglionic (m4-m6): 3 Total: 10 IMPRESSION: 1. No acute intracranial hemorrhage or cortically based infarct. ASPECTS 10. 2. Moderate for age small vessel disease, including age indeterminate involvement of the right thalamus. 3. These results were communicated to Dr Matthews at 11:59 hours on 03/30/2024 by text page via the [facility] messaging system. Electronically signed by: Helayne Hurst MD 03/30/2024 12:00 PM EST RP Workstation: [id number]     EKG: personally reviewed my interpretation is borderline sinus tachycardia with PAC. Prior EKG shows similar.  ASSESSMENT & PLAN:   Assessment & Plan by Problem: Principal Problem:   CVA (cerebral vascular accident) (HCC) Active Problems:   Hyperlipidemia   Essential hypertension   H/O endovascular stent graft for abdominal aortic aneurysm   Anxiety  Dennis Dominguez. is a 68 y.o. male with PMH of AAA s/p endovascular stent graft, HTN/HLD, T2DM, and AUD in remission who presents with a 2-day history of general and right-sided weakness and who is admitted for stroke workup.    Stroke Workup/CVA Patient has CT scan concerning for age indeterminate small vessel disease of thalamus and presented with severe hypertension and deficits suggestive of stroke. He also endorses some difficulty making it to the bathroom but this seems more probably like it is his baseline urinary urgency with weakness  making it difficult for him to make it to the bathroom. It is possible patient sustained CVA and will receive MRI to investigate this further. Patient is currently hemodynamically stable and is out of any thrombolytic window as last known normal was Friday evening. -MRI with acute infarction of the left ventral pons and questionable second small focus of acute infarction of the deep white matter.  Extensive chronic small vessel ischemic changes elsewhere. - Restart aspirin /Plavix  - Per neurology, the patient is outside of the window for permissive hypertension, will restart home antihypertensives as the patient was significantly hypertensive in the ER.  Will monitor closely and avoid hypotension - Neurology following and appreciate recommendations  Toxic metabolic encephalopathy The patient is noticeably confused and easily agitated on exam.  He is alert and oriented to self, year, and president however has difficulty explaining full situation and location.  He does have a very extensive history of alcohol use disorder where he reports drinking heavily from the age of 33 until approximately 3 years ago.  I do suspect some of this confusion and possible ataxia are being contributed to by years of alcohol abuse.  No signs of ophthalmoplegia on exam. I also suspect that this mentation is close to his baseline however will obtain labs in the a.m. to ensure nothing else is contributing.   - Check Thiamine, B12, B9, TSH with AM labs  HTN/HLD H/O AAA endovascular stent graft (08/27/23) Patient has essential HTN managed with Lisinopril  40mg  and Amlodipine  10mg  at home who presented with very elevated BP to a high of 179/136. Given patient being s/p AAA graft repair and being out of permissive HTN window being beneficial, we will restart his home BP regimen to avoid stressing the graft. Will also resume his antiplatelet agents since CT showed no acute bleed. -His blood pressure did come down as his MRI was  completed and the medical team left the room.  Suspect anxiety is contributing greatly to his blood pressure. -Continue Amlodipine  10mg  -Continue Lisinopril  40mg  -Continue plavix  75mg  -Continue Rosuvastatin  40mg  -Consider oral hydralazine  if above 160/120   T2DM Takes metformin at home. Stable with last A1c of 6.6 on 02/06/24. Will hold metformin and start him on SSI. Glucose 95 on admission. Will monitor CBG for hypoglycemia. -Hold Metformin -initiate SSI  Anxiety Patient reports history of anxiety but that he is feeling particularly anxious in the setting of his new symptoms and being in the hospital. He has taken Amitriptyline  in the past with last subscription filled in September but it is unclear if this was a 90-day supply and if he has taken this recently.  -Atarax  10mg  TID prn anxiety  Best practice: Diet: Regular VTE: Enoxaparin  IVF: None,None Code: Full  Disposition planning: Prior to Admission Living Arrangement: Home, living alone with cat Anticipated Discharge Location: Home  Dispo: Admit patient to Observation with expected length of stay less than 2 midnights.  Signed: Leonor BROCKS. Myra

## 2024-03-30 NOTE — Hospital Course (Signed)
 Dennis Dominguez. is a 68 y.o. 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   Stroke Workup/CVA Left Ventral Pontine Infarction Patient had CT scan concerning for age indeterminate small vessel disease of thalamus and presented with severe hypertension and deficits suggestive of stroke. Subsequent MRI showed acute infarction of left ventral pons with very small infarct in deep white matter neat posterior body of left lateral ventricle. TTE obtained showed no clear etiology for cardiac embolic source to stroke. Thrombolytics were not given as he was well out of window by time of encounter; Also was out of window for permissive hypertension. Antihypertensives were reinitiated and patient remained hemodynamically stable throughout admission. On his neuro exam, he had only slightly diminished strength on his right side and was slightly dysarthric. Exam remained unchanged. No neuro status changes through admission. Patient reported feeling better with mobility and using walker by discharge. Neuro signed off and [facility] in 4 weeks. - Continue aspirin  81mg /Plavix  75mg /rosuvastatin  40mg /Zetia10mg  - Continue antihypertensive regimen - PT/OT through home health being set up by [facility] psychotherapist with [facility]  HTN/HLD H/O AAA endovascular stent graft (08/27/23) Patient has essential HTN managed with Lisinopril  40mg  and Amlodipine  5mg  at home who presented with very elevated BP to a high of 179/136. Unclear compliance with medications but patient had BP of 130s/70-80s by time of discharge on current meds. Did have a few episodes of dizziness after restarting his antihypertensives and was positively orthostatic but this resolved spontaneously. Could consider increasing  amlodipine  to 10mg  as he is still above goal. Patient continued antiplatelet therapy without complication and will continue this in setting of his AAA stent graft. -Continue Amlodipine  5mg , consider increase to 10mg  -Continue Lisinopril  40mg  -Continue plavix  75mg  -Continue Rosuvastatin  40mg  -Add Zetia  10mg  daily   Toxic metabolic encephalopathy Patient was noticeably confused and easily agitated on initial exam and had difficulty explaining location but was alert and oriented to self, year, and president. He does have a very extensive history of alcohol use disorder so workup for metabolic encephalopathy was initiated and no acute electrolyte or vitamin abnormalities noted. Possible patient has korsakoff syndrome. Patient following up with neurology in one month where they can evaluate this further.  T2DM Takes metformin at home. Stable with last A1c of 6 on 03/30/24. Metformin held on admission and patient tolerated SSI. No hypoglycemic episodes noted.  Anxiety Doing okay from this standpoint; stable. Did not require anxiolytics during hospitalization. Will need to follow with his PCP regarding his amitriptyline  vs starting SSRI/SNRI.   Incidental finding of Right ICA Aneurysm Patient had ct angio head and neck which revealed the above finding, unrelated to his stroke and clinical presentation. Neurology recommends follow-up with [facility] so we will get him set up for this with Dr. Lester.

## 2024-03-30 NOTE — Consult Note (Signed)
 NEUROLOGY CONSULT NOTE   Date of service: March 30, 2024 Patient Name: Dennis Dominguez. MRN:  [id number] DOB:  Feb 22, 1956 Chief Complaint: CODE STROKE Requesting Provider: Laurice Maude BROCKS, MD  History of Present Illness  Dennis Dominguez. is a 68 y.o. male with hx of HTN, HLD, COPD, DM2, AAA, asthma, anxiety who presented to ED due to slurred speech and right-sided weakness.  Code stroke was activated by triage PA.  On exam, patient has dysarthria and slight RLE drift. He was able to stand up from wheelchair and sit on CT table, did have unsteadiness but did not need assistance. CTH negative.  Patient is outside the window for TNK.  No LVO suspected due to low NIH and absence of VAN + symptoms.   After further discussion with patient and CT, he stated that he received the COVID vaccine Thursday.  He then began to feel sick with some right sided weakness on Friday night and some imbalance, slurred speech and increased weakness happening yesterday. No OAC, but does take Plavix .   LKW: Friday AM Modified rankin score: 0-Completely asymptomatic and back to baseline post- stroke IV Thrombolysis: No, outside of window EVT: No, no LVO suspected   NIHSS components Score: Comment  1a Level of Conscious 0[x]  1[]  2[]  3[]      1b LOC Questions 0[x]  1[]  2[]       1c LOC Commands 0[x]  1[]  2[]       2 Best Gaze 0[x]  1[]  2[]       3 Visual 0[x]  1[]  2[]  3[]      4 Facial Palsy 0[x]  1[]  2[]  3[]      5a Motor Arm - left 0[x]  1[]  2[]  3[]  4[]  UN[]    5b Motor Arm - Right 0[x]  1[]  2[]  3[]  4[]  UN[]    6a Motor Leg - Left 0[x]  1[]  2[]  3[]  4[]  UN[]    6b Motor Leg - Right 0[]  1[x]  2[]  3[]  4[]  UN[]    7 Limb Ataxia 0[x]  1[]  2[]  UN[]      8 Sensory 0[x]  1[]  2[]  UN[]      9 Best Language 0[x]  1[]  2[]  3[]      10 Dysarthria 0[]  1[x]  2[]  UN[]      11 Extinct. and Inattention 0[x]  1[]  2[]       TOTAL:       ROS  Comprehensive ROS performed and pertinent positives documented in HPI   Past History   Past  Medical History:  Diagnosis Date   AAA (abdominal aortic aneurysm)    Anxiety    Arthritis    Asthma    COPD (chronic obstructive pulmonary disease) (HCC)    Diabetes mellitus without complication (HCC)    Family history of adverse reaction to anesthesia    Per patient it took his father a long time to wake up from anesthesia   High cholesterol    Hypertension    Kidney stones    Renal disorder     Past Surgical History:  Procedure Laterality Date   ABDOMINAL AORTIC ENDOVASCULAR STENT GRAFT N/A 08/27/2023   Procedure: INSERTION, ENDOVASCULAR STENT GRAFT, AORTA, ABDOMINAL, INSERTION RIGHT RENAL STENT;  Surgeon: Gretta Lonni PARAS, MD;  Location: [facility] OR;  Service: Vascular;  Laterality: N/A;   lithrotripsy     ULTRASOUND GUIDANCE FOR VASCULAR ACCESS Bilateral 08/27/2023   Procedure: ULTRASOUND GUIDANCE, FOR VASCULAR ACCESS;  Surgeon: Gretta Lonni PARAS, MD;  Location: [facility] OR;  Service: Vascular;  Laterality: Bilateral;    Family History: Family History  Problem Relation Age of Onset  Hypertension Mother    Diabetes Mother     Social History  reports that he has been smoking cigarettes. He has never used smokeless tobacco. He reports that he does not drink alcohol and does not use drugs.  Allergies  Allergen Reactions   Penicillin G Shortness Of Breath    Other Reaction(s): Edema, Dyspnea   Penicillins Anaphylaxis    Has patient had a PCN reaction causing immediate rash, facial/tongue/throat swelling, SOB or lightheadedness with hypotension: Yes Has patient had a PCN reaction causing severe rash involving mucus membranes or skin necrosis: Yes Has patient had a PCN reaction that required hospitalization: Yes Has patient had a PCN reaction occurring within the last 10 years: No If all of the above answers are NO, then may proceed with Cephalosporin use.    Bactrim  [Sulfamethoxazole -Trimethoprim ] Rash    Medications  No current facility-administered medications for  this encounter.  Current Outpatient Medications:    acetaminophen  (TYLENOL ) 500 MG tablet, Take 500-1,000 mg by mouth every 6 (six) hours as needed (headaches.)., Disp: , Rfl:    albuterol  (PROVENTIL  HFA;VENTOLIN  HFA) 108 (90 BASE) MCG/ACT inhaler, Inhale 2 puffs into the lungs every 6 (six) hours as needed for wheezing or shortness of breath. , Disp: , Rfl:    amitriptyline  (ELAVIL ) 50 MG tablet, Take 50 mg by mouth at bedtime., Disp: , Rfl:    aspirin  EC 81 MG tablet, Take 162-243 mg by mouth daily with breakfast., Disp: , Rfl:    Cholecalciferol (VITAMIN D-3 PO), Take 1 tablet by mouth in the morning., Disp: , Rfl:    clindamycin  (CLEOCIN ) 150 MG capsule, Take 1 capsule (150 mg total) by mouth 3 (three) times daily., Disp: 21 capsule, Rfl: 0   clopidogrel  (PLAVIX ) 75 MG tablet, Take 1 tablet (75 mg total) by mouth daily., Disp: 30 tablet, Rfl: 6   diazepam  (VALIUM ) 2 MG tablet, Take 1 tablet (2 mg total) by mouth every 6 (six) hours as needed for anxiety., Disp: 15 tablet, Rfl: 0   gabapentin  (NEURONTIN ) 300 MG capsule, Take 300 mg by mouth daily as needed (pain)., Disp: , Rfl:    HYDROcodone -acetaminophen  (NORCO/VICODIN) 5-325 MG tablet, Take 1 tablet by mouth every 6 (six) hours as needed for moderate pain (pain score 4-6)., Disp: 8 tablet, Rfl: 0   ibuprofen  (ADVIL ,MOTRIN ) 200 MG tablet, Take 800 mg by mouth every 8 (eight) hours as needed (pain.)., Disp: , Rfl:    lidocaine  (LIDODERM ) 5 %, Place 1 patch onto the skin daily as needed (pain.)., Disp: , Rfl:    lisinopril  (ZESTRIL ) 20 MG tablet, Take 20 mg by mouth in the morning., Disp: , Rfl:    metFORMIN (GLUCOPHAGE) 1000 MG tablet, Take 1,000 mg by mouth 2 (two) times daily with a meal., Disp: , Rfl:    oxyCODONE  (ROXICODONE ) 5 MG immediate release tablet, Take 1 tablet (5 mg total) by mouth every 4 (four) hours as needed for severe pain (pain score 7-10)., Disp: 8 tablet, Rfl: 0   simvastatin  (ZOCOR ) 40 MG tablet, Take 40 mg by mouth at  bedtime., Disp: , Rfl:   Vitals   Vitals:   03/30/24 1200 03/30/24 1215 03/30/24 1225 03/30/24 1230  BP: (!) 159/100 (!) 171/97  (!) 159/101  Pulse: (!) 105 100 (!) 106 99  Resp:  (!) 21 (!) 21 (!) 31  Temp:      TempSrc:      SpO2:  100% 98% 96%    There is no height or weight  on file to calculate BMI.   Physical Exam   Constitutional: Appears well-developed and well-nourished.  Cardiovascular: Normal rate and regular rhythm.  Respiratory: Effort normal, non-labored breathing.   Neurologic Examination   Neuro: Mental Status: Patient is awake, alert, oriented to person, place, month, year, and situation Patient is able to give a clear and coherent history. No signs of aphasia or neglect Cranial Nerves: II: Visual Fields are full. Pupils are equal, round, and reactive to light.   III,IV, VI: EOMI without ptosis or diploplia.  V: Facial sensation is symmetric to light touch VII: Facial movement is symmetric.  VIII: hearing is intact to voice X: Uvula elevates symmetrically. Slight dysarthria.  XI: Shoulder shrug is symmetric. XII: tongue is midline without atrophy or fasciculations.  Motor: Tone is normal. Bulk is normal.   RUE: slight drift, 4+/5, strong grip No drifts seen  Sensory: Sensation is symmetric to light touch in the arms and legs. Cerebellar: FNF slow but intact bilaterally   Labs/Imaging/Neurodiagnostic studies   CBC:  Recent Labs  Lab Apr 26, 2024 1141 04/26/24 1155  WBC 11.2*  --   NEUTROABS 7.3  --   HGB 15.7 17.0  HCT 49.0 50.0  MCV 86.1  --   PLT 393  --    Basic Metabolic Panel:  Lab Results  Component Value Date   NA 137 04/26/2024   K 4.5 04/26/2024   CO2 22 08/28/2023   GLUCOSE 110 (H) 04/26/2024   BUN 13 Apr 26, 2024   CREATININE 1.10 26-Apr-2024   CALCIUM  8.9 08/28/2023   GFRNONAA >60 08/28/2023   GFRAA >60 04/20/2018   Lipid Panel:  Lab Results  Component Value Date   LDLCALC 110 (H) 12/09/2007   HgbA1c: No results found  for: HGBA1C Urine Drug Screen:     Component Value Date/Time   LABOPIA NONE DETECTED 10/10/2007 1336   COCAINSCRNUR NONE DETECTED 10/10/2007 1336   LABBENZ NONE DETECTED 10/10/2007 1336   AMPHETMU NONE DETECTED 10/10/2007 1336   THCU NONE DETECTED 10/10/2007 1336   LABBARB  10/10/2007 1336    NONE DETECTED        DRUG SCREEN FOR MEDICAL PURPOSES ONLY.  IF CONFIRMATION IS NEEDED FOR ANY PURPOSE, NOTIFY LAB WITHIN 5 DAYS.    Alcohol Level No results found for: [facility] INR  Lab Results  Component Value Date   INR 1.0 04/26/24   APTT  Lab Results  Component Value Date   APTT 35 04/26/24   AED levels: No results found for: PHENYTOIN, ZONISAMIDE, LAMOTRIGINE, LEVETIRACETA  CT Head without contrast(Personally reviewed): No acute intracranial hemorrhage or cortically based infarct.  ASPECTS 10. Moderate for age small vessel disease, including age indeterminate involvement of the right thalamus.  ASSESSMENT   Dennis Fread. is a 68 y.o. male with hx of HTN, HLD, COPD, DM2, AAA, asthma, anxiety who presented to ED due to slurred speech and right-sided weakness.  Code stroke was activated by triage PA.  On exam, patient is dysarthric with slight right arm drift. States he received the COVID shot Thursday and started feeling bad Friday afternoon.   He is outside the window for TNK treatment. Evaluate further for stroke with MRI Brain. If positive, admit for stroke workup.   RECOMMENDATIONS   - Medical/Infectious workup, per EDP - Outside of permissive hypertension window, avoid hypotension.   - Frequent Neuro checks per stroke unit protocol - MRI Brain stroke protocol - Vascular imaging - CT Angio Head and Neck - TTE - Lipid panel  Patient on Crestor  40mg  - A1C - Antithrombotic - Continue ASA/Plavix  - DVT ppx - lovenox  - Telemetry monitoring for arrhythmia - 72h - Swallow screen - will be performed prior to PO intake - Stroke education - will be  given - PT/OT/SLP - Dispo: admit for stroke workup  ______________________________________________________________________    Signed, Dennis JAYSON Likes, NP [facility]    Attending Neurohospitalist Addendum Patient seen and examined with APP/Resident. Agree with the history and physical as documented above. Agree with the plan as documented, which I helped formulate. I have edited the note above to reflect my full findings and recommendations. I have independently reviewed the chart, obtained history, review of systems and examined the patient.I have personally reviewed pertinent head/neck/spine imaging (CT/MRI). Please feel free to call with any questions.  -- Dennis Ross, MD [facility] [phone]  If 7pm- 7am, please page neurology on call as listed in AMION.

## 2024-03-30 NOTE — Code Documentation (Signed)
 Stroke Response Nurse Documentation Code Documentation  Dennis Dominguez. is a 67 y.o. male arriving to [place]  via Private Vehicle on 03-30-24 with past medical hx of HTN, COPD, DM. On aspirin  81 mg daily and clopidogrel  75 mg daily. Code stroke was activated by ED.   Patient from home where he was LKW at 3pm yesterday and now complaining of slurred speech and right side weakness.     Stroke team at the bedside on patient arrival. Labs drawn and patient cleared for CT by PA Young. Patient to CT with team. NIHSS 3, see documentation for details and code stroke times. Patient with right facial droop, right leg weakness, and dysarthria  on exam. The following imaging was completed:  CT Head. Patient is not a candidate for IV Thrombolytic due to outside thrombolytic window. Patient  a candidate for IR due to no LVO suspected.   Care Plan VS and NIHSS q 2 hours x 12 hours then q 4 hours.  Admit for stroke work up   Bedside handoff with ED RN Dennis Dominguez.    Dennis Dominguez  Stroke Response RN

## 2024-03-30 NOTE — ED Provider Notes (Signed)
 [place] EMERGENCY DEPARTMENT AT [facility] Provider Note   CSN: [id number] Arrival date & time: 03/30/24  1123  An emergency department physician performed an initial assessment on this suspected stroke patient at 1145.  Patient presents with: No chief complaint on file.   Dennis Dominguez. is a 68 y.o. male.  Patient is a 68 year old male with a history of COPD, type 2 diabetes, hyperlipidemia, hypertension, and AAA without rupture who presents to the ED via EMS for concerns of a stroke.  Patient notes he saw his PCP on Thursday of this week.  When he woke up on Friday, he states he felt off and had some weakness in his legs.  He notes symptoms became worse yesterday afternoon.  He notes he was feeling much worse this morning and his girlfriend made him come to the emergency room.  States he is feeling very weak on the right side especially in the right leg.  He had slurred speech.  Denies numbness/tingling or any visual changes.  He believes his last known normal would have been around 3 PM yesterday.  Denies previous history of strokes.  Patient is on Plavix .  No further complaints.   HPI     Prior to Admission medications   Medication Sig Start Date End Date Taking? Authorizing Provider  acetaminophen  (TYLENOL ) 500 MG tablet Take 500-1,000 mg by mouth every 6 (six) hours as needed (headaches.). 03/15/23   [provider]  albuterol  (PROVENTIL  HFA;VENTOLIN  HFA) 108 (90 BASE) MCG/ACT inhaler Inhale 2 puffs into the lungs every 6 (six) hours as needed for wheezing or shortness of breath.     [provider]  amitriptyline  (ELAVIL ) 50 MG tablet Take 50 mg by mouth at bedtime. 04/25/23   [provider]  amLODipine  (NORVASC ) 5 MG tablet Take 5 mg by mouth daily. 03/27/24   [provider]  aspirin  EC 81 MG tablet Take 162-243 mg by mouth daily with breakfast.    [provider]  Cholecalciferol (VITAMIN D-3 PO) Take 1 tablet  by mouth in the morning.    [provider]  clopidogrel  (PLAVIX ) 75 MG tablet Take 1 tablet (75 mg total) by mouth daily. 03/20/24   Gretta Lonni PARAS, MD  cyclobenzaprine (FLEXERIL) 10 MG tablet Take 10 mg by mouth 2 (two) times daily as needed for muscle spasms. 02/06/24   [provider]  diazepam  (VALIUM ) 2 MG tablet Take 1 tablet (2 mg total) by mouth every 6 (six) hours as needed for anxiety. 08/21/23   Gretta Lonni PARAS, MD  gabapentin  (NEURONTIN ) 300 MG capsule Take 300 mg by mouth daily as needed (pain). 04/25/23   [provider]  HYDROcodone -acetaminophen  (NORCO/VICODIN) 5-325 MG tablet Take 1 tablet by mouth every 6 (six) hours as needed for moderate pain (pain score 4-6). 08/28/23   Rhyne, Samantha J, PA-C  ibuprofen  (ADVIL ,MOTRIN ) 200 MG tablet Take 800 mg by mouth every 8 (eight) hours as needed (pain.).    [provider]  lidocaine  (LIDODERM ) 5 % Place 1 patch onto the skin daily as needed (pain.). 04/25/23   [provider]  lisinopril  (ZESTRIL ) 40 MG tablet Take 40 mg by mouth daily. 02/06/24   [provider]  metFORMIN (GLUCOPHAGE) 1000 MG tablet Take 1,000 mg by mouth 2 (two) times daily with a meal. 04/25/23   [provider]  oxyCODONE  (ROXICODONE ) 5 MG immediate release tablet Take 1 tablet (5 mg total) by mouth every 4 (four) hours as needed  for severe pain (pain score 7-10). 09/15/23   Donnajean Lynwood DEL, PA-C  rosuvastatin  (CRESTOR ) 40 MG tablet Take 40 mg by mouth daily. 03/27/24   [provider]    Allergies: Penicillin g, Penicillins, and Bactrim  [sulfamethoxazole -trimethoprim ]    Review of Systems  Constitutional:  Negative for chills and fever.  Respiratory:  Negative for shortness of breath.   Cardiovascular:  Negative for chest pain.  Neurological:  Positive for speech difficulty and weakness. Negative for dizziness, syncope and headaches.  All other systems reviewed and are  negative.   Updated Vital Signs BP (!) 159/101   Pulse 99   Temp 99 F (37.2 C) (Oral)   Resp (!) 31   SpO2 96%   Physical Exam Constitutional:      Appearance: Normal appearance.  HENT:     Head: Normocephalic and atraumatic.     Nose: Nose normal.     Mouth/Throat:     Mouth: Mucous membranes are moist.  Cardiovascular:     Rate and Rhythm: Normal rate.  Pulmonary:     Effort: Pulmonary effort is normal.  Skin:    General: Skin is warm and dry.  Neurological:     Mental Status: He is alert and oriented to person, place, and time.     Cranial Nerves: Cranial nerve deficit present.     Coordination: Coordination abnormal.     Comments: Right-sided facial droop noted as well as some slurred speech.  He does have significant right leg weakness compared to the left on straight leg raise.  Noted to have difficulty with heel-to-shin from right to left.  Psychiatric:        Mood and Affect: Mood normal.        Behavior: Behavior normal.     (all labs ordered are listed, but only abnormal results are displayed) Labs Reviewed  CBC - Abnormal; Notable for the following components:      Result Value   WBC 11.2 (*)    All other components within normal limits  COMPREHENSIVE METABOLIC PANEL WITH GFR - Abnormal; Notable for the following components:   CO2 19 (*)    Total Protein 9.0 (*)    All other components within normal limits  I-STAT CHEM 8, ED - Abnormal; Notable for the following components:   Glucose, Bld 110 (*)    Calcium , Ion 1.11 (*)    TCO2 20 (*)    All other components within normal limits  CBG MONITORING, ED - Abnormal; Notable for the following components:   Glucose-Capillary 118 (*)    All other components within normal limits  PROTIME-INR  APTT  DIFFERENTIAL  ETHANOL  URINALYSIS, W/ REFLEX TO CULTURE (INFECTION SUSPECTED)  RAPID URINE DRUG SCREEN, HOSP PERFORMED    EKG: None  Radiology: CT HEAD CODE STROKE WO CONTRAST Result Date:  03/30/2024 EXAM: CT HEAD WITHOUT 03/30/2024 11:52:00 AM TECHNIQUE: CT of the head was performed without the administration of intravenous contrast. Automated exposure control, iterative reconstruction, and/or weight based adjustment of the mA/kV was utilized to reduce the radiation dose to as low as reasonably achievable. COMPARISON: Temporal bone CT 12/20/2009. CLINICAL HISTORY: 68 year old male with acute neuro deficit, stroke suspected. FINDINGS: BRAIN AND VENTRICLES: No acute intracranial hemorrhage. No mass effect or midline shift. No extra-axial fluid collection. No evidence of acute infarct. No hydrocephalus. Patchy and moderate asymmetric mostly periventricular white matter hypodensity in both hemispheres. Small age indeterminate right caudate lacunar infarct series 2 image 18. Similar small age indeterminate  hypodensity in the central right thalamus on image 16. No suspicious intracranial vascular hyperdensity. On sagittal image 25 the caudate lacune appears circumscribed and chronic. Calcified atherosclerosis at the skull base. No gaze deviation. ORBITS: No acute abnormality. SINUSES AND MASTOIDS: Chronic appearing right maxillary sinus disease with mucoperiosteal thickening. Trace fluid in the left maxillary sinus. Other paranasal sinuses, middle ears and mastoids are well aerated. SOFT TISSUES AND SKULL: No acute skull fracture. No acute soft tissue abnormality. [place] Stroke Program Early CT Score (ASPECTS) ----- Ganglionic (caudate, ic, lentiform nucleus, insula, M1-m3): 7 Supraganglionic (m4-m6): 3 Total: 10 IMPRESSION: 1. No acute intracranial hemorrhage or cortically based infarct. ASPECTS 10. 2. Moderate for age small vessel disease, including age indeterminate involvement of the right thalamus. 3. These results were communicated to Dr Matthews at 11:59 hours on 03/30/2024 by text page via the [facility] messaging system. Electronically signed by: Helayne Hurst MD 03/30/2024 12:00 PM EST RP Workstation:  [id number]      Medications Ordered in the ED - No data to display   Clinical Course as of 03/30/24 1347  Sun Mar 30, 2024  1219 Dr stack with neurology evaluated at bedside. Advised concerns for stroke on CT and to admit for MRI and further evaluation. No acute intervention at this time.   [AY]    Clinical Course User Index [AY] Neysa Thersia RAMAN PA-C                  NIH Stroke Scale: 5              Medical Decision Making Amount and/or Complexity of Data Reviewed Labs: ordered.   Patient is a 68 year old male who presents to the ED for concerns of strokelike symptoms.  Patient notes right-sided weakness and slurred speech.  He believes he was normal around 3 PM yesterday but states he started feeling bad on Friday morning.  Please see detailed HPI above.  On exam patient is alert and in no acute distress.  He is noted to have right sided facial droop as well as slurred speech.  He does have right leg weakness compared to the left and difficulty with heel-to-shin.  Initial CT of the head does show age-indeterminate involvement of the right thalamus.  There is concerns for stroke as patient has no history of previous stroke.  Dr. Matthews with neurology did meet patient in ED.  She advised that no acute intervention is needed at this time but to proceed with stroke workup as well as MRI and can admit to medicine.  Hospitalist has been consulted and are agreeable to admit patient for further evaluation and management.  Patient stable while in ED.  I discussed this case with my attending physician who cosigned this note including patient's presenting symptoms, physical exam, and planned diagnostics and interventions. Attending physician stated agreement with plan or made changes to plan which were implemented.   Attending physician assessed patient at bedside.     Final diagnoses:  Cerebrovascular accident (CVA), unspecified mechanism [facility])    ED Discharge Orders     None           Neysa Thersia RAMAN, [place] 03/30/24 1347    Laurice Maude BROCKS, MD 03/30/24 [phone]

## 2024-03-30 NOTE — Plan of Care (Signed)
  Problem: Education: Goal: Ability to describe self-care measures that may prevent or decrease complications (Diabetes Survival Skills Education) will improve Outcome: Progressing Goal: Individualized Educational Video(s) Outcome: Progressing   Problem: Coping: Goal: Ability to adjust to condition or change in health will improve Outcome: Progressing   Problem: Fluid Volume: Goal: Ability to maintain a balanced intake and output will improve Outcome: Progressing   Problem: Health Behavior/Discharge Planning: Goal: Ability to identify and utilize available resources and services will improve Outcome: Progressing Goal: Ability to manage health-related needs will improve Outcome: Progressing   Problem: Skin Integrity: Goal: Risk for impaired skin integrity will decrease Outcome: Progressing   Problem: Tissue Perfusion: Goal: Adequacy of tissue perfusion will improve Outcome: Progressing   Problem: Education: Goal: Knowledge of General Education information will improve Description: Including pain rating scale, medication(s)/side effects and non-pharmacologic comfort measures Outcome: Progressing

## 2024-03-30 NOTE — ED Triage Notes (Signed)
 Patient states that he thinks he had a stroke last night. He noticed the symptoms yesterday evening. He has slurred speech, bilateral leg weakness, right sided facial drop. Denies numbness or tingling and no visual changes.  LKN was around 3pm he states.   Denies strokes in the past.

## 2024-03-30 NOTE — ED Provider Triage Note (Signed)
 Emergency Medicine Provider Triage Evaluation Note  Dennis Dominguez. , a 68 y.o. male  was evaluated in triage.  Pt complains of neuro deficit. Report noticing his speech was slurred around 3PM yesterday. Also noticing droop to R side of face as well as numbness/weakness to R arm and R leg.  Endorse feeling dizzy. Denies headache, cp, sob, abd pain.  Remote hx of Bell's Palsy.  No prior stroke.  Is a smoker  Review of Systems  Positive: As above Negative: As above  Physical Exam  BP (!) 138/121   Pulse (!) 109   Temp 99 F (37.2 C) (Oral)   Resp 18   SpO2 96%  Gen:   Awake, no distress   Resp:  Normal effort  MSK:   Moves extremities without difficulty  Other:  Neurologic exam:  Speech garbled, pupils equal round reactive to light, extraocular movements intact Normal peripheral visual fields R side facial droop Follows commands, moves all extremities x4, RUE/RLE 4/5 compared to LUE/LLE 5/5 Sensation normal to light touch Dysmetria  R pronator drift Gait antalgic   Medical Decision Making  Medically screening exam initiated at 11:40 AM.  Appropriate orders placed.  Debby CHRISTELLA Zackary Mickey. was informed that the remainder of the evaluation will be completed by another provider, this initial triage assessment does not replace that evaluation, and the importance of remaining in the ED until their evaluation is complete.  LVO positive, will activate code stroke   Nivia Colon, PA-C 03/30/24 1144

## 2024-03-31 ENCOUNTER — Observation Stay (HOSPITAL_COMMUNITY)

## 2024-03-31 DIAGNOSIS — F17200 Nicotine dependence, unspecified, uncomplicated: Secondary | ICD-10-CM

## 2024-03-31 DIAGNOSIS — I6329 Cerebral infarction due to unspecified occlusion or stenosis of other precerebral arteries: Principal | ICD-10-CM

## 2024-03-31 DIAGNOSIS — Z7902 Long term (current) use of antithrombotics/antiplatelets: Secondary | ICD-10-CM

## 2024-03-31 DIAGNOSIS — I7143 Infrarenal abdominal aortic aneurysm, without rupture: Secondary | ICD-10-CM

## 2024-03-31 DIAGNOSIS — Z95828 Presence of other vascular implants and grafts: Secondary | ICD-10-CM

## 2024-03-31 DIAGNOSIS — Z8679 Personal history of other diseases of the circulatory system: Secondary | ICD-10-CM

## 2024-03-31 DIAGNOSIS — E1151 Type 2 diabetes mellitus with diabetic peripheral angiopathy without gangrene: Secondary | ICD-10-CM

## 2024-03-31 DIAGNOSIS — Z79899 Other long term (current) drug therapy: Secondary | ICD-10-CM

## 2024-03-31 DIAGNOSIS — I639 Cerebral infarction, unspecified: Secondary | ICD-10-CM | POA: Diagnosis present

## 2024-03-31 DIAGNOSIS — E119 Type 2 diabetes mellitus without complications: Secondary | ICD-10-CM

## 2024-03-31 DIAGNOSIS — I6389 Other cerebral infarction: Secondary | ICD-10-CM

## 2024-03-31 DIAGNOSIS — I10 Essential (primary) hypertension: Secondary | ICD-10-CM

## 2024-03-31 DIAGNOSIS — Z7982 Long term (current) use of aspirin: Secondary | ICD-10-CM

## 2024-03-31 DIAGNOSIS — E785 Hyperlipidemia, unspecified: Secondary | ICD-10-CM

## 2024-03-31 DIAGNOSIS — F419 Anxiety disorder, unspecified: Secondary | ICD-10-CM

## 2024-03-31 DIAGNOSIS — I613 Nontraumatic intracerebral hemorrhage in brain stem: Secondary | ICD-10-CM

## 2024-03-31 DIAGNOSIS — Z7984 Long term (current) use of oral hypoglycemic drugs: Secondary | ICD-10-CM

## 2024-03-31 DIAGNOSIS — F17210 Nicotine dependence, cigarettes, uncomplicated: Secondary | ICD-10-CM

## 2024-03-31 LAB — CBC
HCT: 43.4 % (ref 39.0–52.0)
Hemoglobin: 14.2 g/dL (ref 13.0–17.0)
MCH: 27.6 pg (ref 26.0–34.0)
MCHC: 32.7 g/dL (ref 30.0–36.0)
MCV: 84.3 fL (ref 80.0–100.0)
Platelets: 364 K/uL (ref 150–400)
RBC: 5.15 MIL/uL (ref 4.22–5.81)
RDW: 14.6 % (ref 11.5–15.5)
WBC: 9.8 K/uL (ref 4.0–10.5)
nRBC: 0 % (ref 0.0–0.2)

## 2024-03-31 LAB — COMPREHENSIVE METABOLIC PANEL WITH GFR
ALT: 26 U/L (ref 0–44)
AST: 19 U/L (ref 15–41)
Albumin: 3.5 g/dL (ref 3.5–5.0)
Alkaline Phosphatase: 56 U/L (ref 38–126)
Anion gap: 9 (ref 5–15)
BUN: 14 mg/dL (ref 8–23)
CO2: 25 mmol/L (ref 22–32)
Calcium: 9.2 mg/dL (ref 8.9–10.3)
Chloride: 97 mmol/L — ABNORMAL LOW (ref 98–111)
Creatinine, Ser: 0.99 mg/dL (ref 0.61–1.24)
GFR, Estimated: >60 mL/min (ref >60)
Glucose, Bld: 94 mg/dL (ref 70–99)
Potassium: 3.9 mmol/L (ref 3.5–5.1)
Sodium: 131 mmol/L — ABNORMAL LOW (ref 135–145)
Total Bilirubin: 0.9 mg/dL (ref 0.0–1.2)
Total Protein: 7.8 g/dL (ref 6.5–8.1)

## 2024-03-31 LAB — VITAMIN B12: Vitamin B-12: 230 pg/mL (ref 180–914)

## 2024-03-31 LAB — HEMOGLOBIN A1C
Hgb A1c MFr Bld: 6 % — ABNORMAL HIGH (ref 4.8–5.6)
Mean Plasma Glucose: 125.5 mg/dL

## 2024-03-31 LAB — GLUCOSE, CAPILLARY
Glucose-Capillary: 106 mg/dL — ABNORMAL HIGH (ref 70–99)
Glucose-Capillary: 97 mg/dL (ref 70–99)
Glucose-Capillary: 122 mg/dL — ABNORMAL HIGH (ref 70–99)
Glucose-Capillary: 115 mg/dL — ABNORMAL HIGH (ref 70–99)
Glucose-Capillary: 114 mg/dL — ABNORMAL HIGH (ref 70–99)
Glucose-Capillary: 134 mg/dL — ABNORMAL HIGH (ref 70–99)

## 2024-03-31 LAB — LIPID PANEL
Cholesterol: 172 mg/dL (ref 0–200)
HDL: 43 mg/dL (ref >40)
LDL Cholesterol: 99 mg/dL (ref 0–99)
Total CHOL/HDL Ratio: 4 ratio
Triglycerides: 148 mg/dL (ref <150)
VLDL: 30 mg/dL (ref 0–40)

## 2024-03-31 LAB — ECHOCARDIOGRAM COMPLETE
Area-P 1/2: 3.89 cm2
S' Lateral: 2.9 cm

## 2024-03-31 LAB — TSH: TSH: 2.563 u[IU]/mL (ref 0.350–4.500)

## 2024-03-31 LAB — FOLATE: Folate: 18.8 ng/mL (ref >5.9)

## 2024-03-31 LAB — HIV ANTIBODY (ROUTINE TESTING W REFLEX): HIV Screen 4th Generation wRfx: NONREACTIVE

## 2024-03-31 MED ORDER — ASPIRIN 81 MG PO TBEC
162.0000 mg | DELAYED_RELEASE_TABLET | Freq: Every day | ORAL | Status: DC
  Filled 2024-03-31: qty 2

## 2024-03-31 MED ORDER — NICOTINE 21 MG/24HR TD PT24
21.0000 mg | MEDICATED_PATCH | Freq: Every day | TRANSDERMAL | Status: DC | PRN
  Filled 2024-03-31: qty 1

## 2024-03-31 MED ORDER — EZETIMIBE 10 MG PO TABS
10.0000 mg | ORAL_TABLET | Freq: Every day | ORAL | Status: DC
  Administered 2024-03-31 – 2024-04-01 (×2): 10 mg via ORAL
  Filled 2024-03-31 (×2): qty 1

## 2024-03-31 MED ORDER — ASPIRIN 81 MG PO TBEC
81.0000 mg | DELAYED_RELEASE_TABLET | Freq: Every day | ORAL | Status: DC
  Administered 2024-03-31 – 2024-04-01 (×2): 81 mg via ORAL
  Filled 2024-03-31: qty 1

## 2024-03-31 NOTE — TOC Initial Note (Addendum)
 Transition of Dominguez (TOC) - Initial/Assessment Note    Patient Details  Name: Dennis Dominguez. MRN: [id number] Date of Birth: 12-09-1955  Transition of Dominguez [facility]) CM/SW Contact:    Dennis Dennis George, RN Phone Number: 03/31/2024, 3:36 PM  Clinical Narrative:                 68yo man with heavy tobacco use, HTN, HLD, and prior heavy alcohol use (last drink 3 years ago), who presented with 2 days of general and right-sided weakness, admitted with an acute infarct in the left ventral pons.   Pt is from home alone. He states he has a friend that can check on him and assist some at home named, Dennis Dominguez.  Recommendations are for CIR. Pt is refusing and says it would be like going to jail to attend CIR. CM attempted to educate without success. Pt asking to d/c home. Pt is agreeable to [facility] services. CM will send in request to the [facility]. Dennis Dominguez accepted and information on the AVS. Dennis Dominguez will contact him for the first home visit. DME ordered through the [facility]. Pt is aware the DME wont arrive to his home for several days.   Pt states Dennis Dominguez will assist with transportation and will transport him home at d/c.   [facility].  [facility]P: Dr Dennis Dominguez at [facility]: Dennis Dominguez [phone] ext [phone]  IP Dominguez management following.  Expected Discharge Plan: Home w Home Health Services Barriers to Discharge: Continued Medical Work up   Patient Goals and CMS Choice   CMS Medicare.gov Compare Post Acute Dominguez list provided to:: Patient Choice offered to / list presented to : Patient      Expected Discharge Plan and Services   Discharge Planning Services: CM Consult Post Acute Dominguez Choice: Home Health, Durable Medical Equipment Living arrangements for the past 2 months: Apartment                 DME Arranged: Bedside commode, Walker rolling DME Agency: [employer], [place] Date DME Agency Contacted: 03/31/24   Representative spoke with at DME Agency: email HH Arranged: RN, PT, OT, Nurse's Aide,  Speech Therapy, Social Work          Prior Living Arrangements/Services Living arrangements for the past 2 months: Apartment Lives with:: Self Patient language and need for interpreter reviewed:: Yes Do you feel safe going back to the place where you live?: Yes      Need for Family Participation in Patient Dominguez: Yes (Comment) Dominguez giver support system in place?:  (hard to tell if friend will assist as much as needed)   Criminal Activity/Legal Involvement Pertinent to Current Situation/Hospitalization: No - Comment as needed  Activities of Daily Living   ADL Screening (condition at time of admission) Independently performs ADLs?: No Does the patient have a NEW difficulty with bathing/dressing/toileting/self-feeding that is expected to last >3 days?: Yes (Initiates electronic notice to provider for possible OT consult) Does the patient have a NEW difficulty with getting in/out of bed, walking, or climbing stairs that is expected to last >3 days?: Yes (Initiates electronic notice to provider for possible PT consult) Does the patient have a NEW difficulty with communication that is expected to last >3 days?: No Is the patient deaf or have difficulty hearing?: Yes Does the patient have difficulty seeing, even when wearing glasses/contacts?: No Does the patient have difficulty concentrating, remembering, or making decisions?: Yes  Permission Sought/Granted  Emotional Assessment Appearance:: Appears older than stated age Attitude/Demeanor/Rapport: Engaged Affect (typically observed): Accepting Orientation: : Oriented to Self, Oriented to Place, Oriented to Situation   Psych Involvement: No (comment)  Admission diagnosis:  CVA (cerebral vascular accident) (HCC) [I63.9] Cerebrovascular accident (CVA), unspecified mechanism (HCC) [I63.9] Patient Active Problem List   Diagnosis Date Noted   Tobacco use disorder 03/31/2024   CVA (cerebral vascular accident) (HCC)  03/31/2024   Left pontine stroke (HCC) 03/30/2024   H/O endovascular stent graft for abdominal aortic aneurysm 03/30/2024   Anxiety 03/30/2024   AAA (abdominal aortic aneurysm) 08/27/2023   Chronic obstructive pulmonary disease (HCC) 08/21/2023   Hyperlipidemia 08/21/2023   Major depressive disorder 08/21/2023   Alcohol dependence (HCC) 08/21/2023   Essential hypertension 08/21/2023   Chronic neck pain 08/21/2023   Homelessness unspecified 08/21/2023   AAA (abdominal aortic aneurysm) without rupture 08/21/2023   Type 2 diabetes mellitus (HCC) 09/15/2019   Vitamin D deficiency 09/15/2019   PCP:  Clinic, Dennis Dominguez:   [facility] [phone] - [place] Dominguez, [place] - [address] AT [facility] OF [address] [facility] [address] [place] [address] Phone: [phone] Fax: [phone]  Dennis Dominguez [facility] [address] [place] [address] Phone: [phone] Fax: [phone]     Social Drivers of Health (SDOH) Social History: SDOH Screenings   Food Insecurity: No Food Insecurity (03/30/2024)  Housing: Low Risk  (03/30/2024)  Transportation Needs: No Transportation Needs (03/30/2024)  Utilities: Not At Risk (03/30/2024)  Social Connections: Socially Isolated (03/30/2024)  Tobacco Use: High Risk (02/26/2024)   SDOH Interventions:     Readmission Risk Interventions     No data to display

## 2024-03-31 NOTE — Progress Notes (Signed)
  Echocardiogram 2D Echocardiogram has been performed.  Koleen KANDICE Popper, RDCS 03/31/2024, 12:15 PM

## 2024-03-31 NOTE — Evaluation (Signed)
 Occupational Therapy Evaluation Patient Details Name: Dennis Dominguez. MRN: [id number] DOB: 05-21-1955 Today's Date: 03/31/2024   History of Present Illness   Pt is a 68 y.o. male presenting 11/23 with RUE weakness and slurred speech. 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 PMH: HTN, HLD, COPD, DM2, AAA, asthma, anxiety     Clinical Impressions PTA, pt lived alone and was modified independent in ADL and IADL. Upon eval, pt with R weakness, decr coordination, safety, balance, and insight into deficits. Pt currently requiring up to mod A for BADL. This session, able to don socks mod A, perform grooming in standing with min A, and ambulate with RW with min A. Pt needing cues and education for safe RW use. Will continue to follow. Due to significant functional decline, recommending intensive multidisciplinary rehabilitation >3 hours/day to optimize safety and independence in ADL.        If plan is discharge home, recommend the following:   A little help with walking and/or transfers;A lot of help with bathing/dressing/bathroom;Assistance with cooking/housework;Assist for transportation;Help with stairs or ramp for entrance     Functional Status Assessment   Patient has had a recent decline in their functional status and demonstrates the ability to make significant improvements in function in a reasonable and predictable amount of time.     Equipment Recommendations   BSC/3in1;Other (comment) (RW)     Recommendations for Other Services         Precautions/Restrictions   Precautions Precautions: Fall Recall of Precautions/Restrictions: Impaired Restrictions Weight Bearing Restrictions Per Provider Order: No     Mobility Bed Mobility Overal bed mobility: Needs Assistance Bed Mobility: Supine to Sit     Supine to sit: Min assist     General bed mobility comments: for truncal elevation and scooting to EOB    Transfers Overall transfer level: Needs  assistance Equipment used: Rolling walker (2 wheels) Transfers: Sit to/from Stand Sit to Stand: Min assist           General transfer comment: for steadying      Balance Overall balance assessment: Needs assistance Sitting-balance support: No upper extremity supported, Feet supported Sitting balance-Leahy Scale: Fair Sitting balance - Comments: LOB with challenge/dressing RLE Postural control: Posterior lean Standing balance support: Bilateral upper extremity supported, During functional activity, Reliant on assistive device for balance Standing balance-Leahy Scale: Poor Standing balance comment: reliant on device and intermittent external support                           ADL either performed or assessed with clinical judgement   ADL Overall ADL's : Needs assistance/impaired Eating/Feeding: Set up;Sitting   Grooming: Minimal assistance;Sitting;Oral care   Upper Body Bathing: Set up;Sitting   Lower Body Bathing: Minimal assistance;Sit to/from stand   Upper Body Dressing : Set up;Sitting   Lower Body Dressing: Moderate assistance;Sitting/lateral leans Lower Body Dressing Details (indicate cue type and reason): to don R sock Toilet Transfer: Minimal assistance;Ambulation;Rolling walker (2 wheels);Comfort height toilet           Functional mobility during ADLs: Minimal assistance;Rolling walker (2 wheels);Contact guard assist;Cueing for safety       Vision Patient Visual Report: No change from baseline Vision Assessment?: No apparent visual deficits Additional Comments: not formally assessed this session, able to locate items in room     Perception         Praxis         Pertinent Vitals/Pain  Pain Assessment Pain Assessment: Faces Faces Pain Scale: No hurt Pain Intervention(s): Monitored during session     Extremity/Trunk Assessment Upper Extremity Assessment Upper Extremity Assessment: Generalized weakness;RUE deficits/detail RUE Deficits  / Details: decr coordination, effortful during functional ADL participation, but able to do without assist RUE Sensation: decreased proprioception RUE Coordination: decreased fine motor;decreased gross motor   Lower Extremity Assessment Lower Extremity Assessment: Defer to PT evaluation       Communication Communication Communication: Impaired Factors Affecting Communication: Reduced clarity of speech   Cognition Arousal: Alert Behavior During Therapy: Lability Cognition: Cognition impaired     Awareness: Online awareness impaired   Attention impairment (select first level of impairment): Sustained attention, Selective attention Executive functioning impairment (select all impairments): Organization, Sequencing, Reasoning, Problem solving (initially very aware he should not get up without help, but then attempting to get up with PT on other side of room at end of session during handoff) OT - Cognition Comments: decr insight into deficits at times, although able to recognize they exist                 Following commands: Impaired Following commands impaired: Follows multi-step commands inconsistently     Cueing  General Comments   Cueing Techniques: Verbal cues;Gestural cues      Exercises     Shoulder Instructions      Home Living Family/patient expects to be discharged to:: Private residence Living Arrangements: Alone Available Help at Discharge: Friend(s);Available PRN/intermittently Type of Home: Apartment Home Access: Stairs to enter Entrance Stairs-Number of Steps: flight Entrance Stairs-Rails:  (has a rail, could not recall which side) Home Layout: One level     Bathroom Shower/Tub: Chief Strategy Officer: Standard     Home Equipment: None          Prior Functioning/Environment Prior Level of Function : Independent/Modified Independent             Mobility Comments: no AD ADLs Comments: independent in ADL, mod I for IADL,  does own grocery shopping via ambulation to grocery store, does not drive    OT Problem List: Decreased strength;Decreased activity tolerance;Impaired balance (sitting and/or standing);Decreased cognition;Decreased knowledge of use of DME or AE;Impaired UE functional use   OT Treatment/Interventions: Self-care/ADL training;DME and/or AE instruction;Neuromuscular education;Therapeutic exercise;Therapeutic activities;Patient/family education;Balance training      OT Goals(Current goals can be found in the care plan section)   Acute Rehab OT Goals Patient Stated Goal: get better OT Goal Formulation: With patient Time For Goal Achievement: 04/14/24 Potential to Achieve Goals: Good   OT Frequency:  Min 2X/week    Co-evaluation              AM-PAC OT 6 Clicks Daily Activity     Outcome Measure Help from another person eating meals?: A Little Help from another person taking care of personal grooming?: A Little Help from another person toileting, which includes using toliet, bedpan, or urinal?: A Lot Help from another person bathing (including washing, rinsing, drying)?: A Lot Help from another person to put on and taking off regular upper body clothing?: A Little Help from another person to put on and taking off regular lower body clothing?: A Lot 6 Click Score: 15   End of Session Equipment Utilized During Treatment: Gait belt;Rolling walker (2 wheels) Nurse Communication: Mobility status  Activity Tolerance: Patient tolerated treatment well Patient left: Other (comment) (standing by chair with PTS and PT)  OT Visit Diagnosis: Unsteadiness on feet (R26.81);Muscle  weakness (generalized) (M62.81);Other symptoms and signs involving cognitive function;Pain;Cognitive communication deficit (R41.841)                Time: 9243-9179 OT Time Calculation (min): 24 min Charges:  OT General Charges $OT Visit: 1 Visit OT Evaluation $OT Eval Moderate Complexity: 1 Mod OT  Treatments $Self Care/Home Management : 8-22 mins  Elma JONETTA Lebron FREDERICK, OTR/L [facility] Acute Rehabilitation Office: [phone]   Elma JONETTA Lebron 03/31/2024, 8:37 AM

## 2024-03-31 NOTE — Evaluation (Addendum)
 Physical Therapy Evaluation Patient Details Name: Dennis Dominguez. MRN: [id number] DOB: [birth year] Today's Date: 03/31/2024  History of Present Illness  Pt is a 68 y.o. male presenting 11/23 with RUE weakness and slurred speech. 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 PMH: HTN, HLD, COPD, DM2, AAA, asthma, anxiety  Clinical Impression  PTA, pt living alone in apartment with flight of stairs to enter, independent with ADLs, typically does not drive, walking with no AD. Currently, pt able to ambulate 2 bouts of 100' with RW, requiring standing rest break in between. At times, he required minA to prevent a fall. Pt has tendency to inc gait speed, resulting in bouts of stumbling and scissoring. VC required for good posture, RW proximity, and avoiding crossing feet in attempt to regain balance. Noted R LE weakness during gait and transfers today. As patient has decreased family support available and has experienced a significant functional change from baseline, recommending post-acute rehab >3hrs/day to improve safety with functional mobility and independence with ADLs. Acute PT to follow.       If plan is discharge home, recommend the following: A little help with walking and/or transfers;A little help with bathing/dressing/bathroom;Assistance with cooking/housework;Assist for transportation;Help with stairs or ramp for entrance   Can travel by private vehicle        Equipment Recommendations Rolling walker (2 wheels);BSC/3in1  Recommendations for Other Services  Rehab consult    Functional Status Assessment Patient has had a recent decline in their functional status and demonstrates the ability to make significant improvements in function in a reasonable and predictable amount of time.     Precautions / Restrictions Precautions Precautions: Fall Recall of Precautions/Restrictions: Impaired Restrictions Weight Bearing Restrictions Per Provider Order: No      Mobility  Bed  Mobility               General bed mobility comments: Received in standing, returned to sitting in chair    Transfers Overall transfer level: Needs assistance Equipment used: Rolling walker (2 wheels) Transfers: Sit to/from Stand Sit to Stand: Contact guard assist           General transfer comment: CGA for safety, cueing for grabbing at least one handle of chair before sitting    Ambulation/Gait Ambulation/Gait assistance: Min assist, Contact guard assist Gait Distance (Feet): 100 Feet (x2) Assistive device: Rolling walker (2 wheels) Gait Pattern/deviations: Decreased stance time - right, Decreased stride length, Decreased dorsiflexion - right, Decreased dorsiflexion - left, Shuffle Gait velocity: Dec Gait velocity interpretation: <1.8 ft/sec, indicate of risk for recurrent falls   General Gait Details: Pt with dec R stance time and varying gait speed. With inc speed, pt had tendency to stumble and inc RW proximity. Notable R toe out, with pt able to correct but unable to maintain; he is unsure if this is his baseline. Had 1 notable LOB, requiring minA to mainain upright. Cues given for RW proximity, consistent gait speed, and safe techniques for regaining balance during gait.  Stairs Stairs: Yes Stairs assistance: Contact guard assist Stair Management: Step to pattern, Forwards, Two rails Number of Stairs: 2 General stair comments: Requriring close CGA for stair mobility. Noted dec eccentric control during descension of stairs. Cues for hand placement on rails.  Wheelchair Mobility     Tilt Bed    Modified Rankin (Stroke Patients Only) Modified Rankin (Stroke Patients Only) Pre-Morbid Rankin Score: No symptoms Modified Rankin: Moderately severe disability     Balance Overall balance assessment: Needs assistance  Sitting-balance support: No upper extremity supported, Feet supported Sitting balance-Leahy Scale: Fair     Standing balance support: Bilateral  upper extremity supported, During functional activity, Reliant on assistive device for balance Standing balance-Leahy Scale: Poor Standing balance comment: Reliant on external support                             Pertinent Vitals/Pain Pain Assessment Pain Assessment: Faces Faces Pain Scale: No hurt Pain Intervention(s): Monitored during session    Home Living Family/patient expects to be discharged to:: Private residence Living Arrangements: Alone Available Help at Discharge: Friend(s);Available PRN/intermittently Type of Home: Apartment Home Access: Stairs to enter Entrance Stairs-Rails: Right;Left;Can reach both Entrance Stairs-Number of Steps: flight   Home Layout: One level Home Equipment: None      Prior Function Prior Level of Function : Independent/Modified Independent             Mobility Comments: no AD ADLs Comments: independent in ADL, mod I for IADL, does own grocery shopping via ambulation to grocery store, does not drive     Extremity/Trunk Assessment   Upper Extremity Assessment Upper Extremity Assessment: Defer to OT evaluation RUE Deficits / Details: decr coordination, effortful during functional ADL participation, but able to do without assist RUE Sensation: decreased proprioception RUE Coordination: decreased fine motor;decreased gross motor    Lower Extremity Assessment Lower Extremity Assessment: RLE deficits/detail RLE Deficits / Details: R toe out with ability to actively correct with VC, but did not maintain. Denies numbness or tingling in B LE.    Cervical / Trunk Assessment Cervical / Trunk Assessment: Kyphotic  Communication   Communication Communication: Impaired Factors Affecting Communication: Reduced clarity of speech    Cognition Arousal: Alert Behavior During Therapy: Lability, Impulsive   PT - Cognitive impairments: Safety/Judgement                       PT - Cognition Comments: Pt slightly impulsive  with mobility, including STS before instruction to do so. Pt would occasionally inc gait speed to the point of stumbling. Following commands: Impaired Following commands impaired: Follows multi-step commands inconsistently     Cueing Cueing Techniques: Verbal cues, Gestural cues     General Comments General comments (skin integrity, edema, etc.): Pt expresses distress and sadness at current disabilities.    Exercises     Assessment/Plan    PT Assessment Patient needs continued PT services  PT Problem List Decreased strength;Decreased activity tolerance;Decreased balance;Decreased mobility;Decreased coordination;Decreased knowledge of use of DME;Decreased safety awareness       PT Treatment Interventions DME instruction;Gait training;Stair training;Functional mobility training;Therapeutic activities;Therapeutic exercise;Balance training;Neuromuscular re-education;Cognitive remediation;Patient/family education;Manual techniques;Modalities    PT Goals (Current goals can be found in the Care Plan section)  Acute Rehab PT Goals Patient Stated Goal: to go home PT Goal Formulation: With patient Time For Goal Achievement: 04/14/24 Potential to Achieve Goals: Good    Frequency Min 3X/week     Co-evaluation               AM-PAC PT 6 Clicks Mobility  Outcome Measure Help needed turning from your back to your side while in a flat bed without using bedrails?: A Little Help needed moving from lying on your back to sitting on the side of a flat bed without using bedrails?: A Little Help needed moving to and from a bed to a chair (including a wheelchair)?: A Little Help needed standing  up from a chair using your arms (e.g., wheelchair or bedside chair)?: A Little Help needed to walk in hospital room?: A Little Help needed climbing 3-5 steps with a railing? : A Little 6 Click Score: 18    End of Session Equipment Utilized During Treatment: Gait belt Activity Tolerance: Patient  tolerated treatment well Patient left: in chair;with call bell/phone within reach;with chair alarm set Nurse Communication: Mobility status PT Visit Diagnosis: Unsteadiness on feet (R26.81);Other abnormalities of gait and mobility (R26.89);Difficulty in walking, not elsewhere classified (R26.2)    Time: 9184-9163 PT Time Calculation (min) (ACUTE ONLY): 21 min   Charges:   PT Evaluation $PT Eval Low Complexity: 1 Low   PT General Charges $$ ACUTE PT VISIT: 1 Visit         Marius Betts, SPT   Kelii Chittum 03/31/2024, 10:14 AM

## 2024-03-31 NOTE — Evaluation (Addendum)
 Clinical/Bedside Swallow Evaluation Patient Details  Name: Dennis Dominguez. MRN: [id number] Date of Birth: 03-20-1956  Today's Date: 03/31/2024 Time: SLP Start Time (ACUTE ONLY): 1452 SLP Stop Time (ACUTE ONLY): 1514 SLP Time Calculation (min) (ACUTE ONLY): 22 min  Past Medical History:  Past Medical History:  Diagnosis Date   AAA (abdominal aortic aneurysm)    Anxiety    Arthritis    Asthma    COPD (chronic obstructive pulmonary disease) (HCC)    Diabetes mellitus without complication (HCC)    Family history of adverse reaction to anesthesia    Per patient it took his father a long time to wake up from anesthesia   High cholesterol    Hypertension    Kidney stones    Renal disorder    Past Surgical History:  Past Surgical History:  Procedure Laterality Date   ABDOMINAL AORTIC ENDOVASCULAR STENT GRAFT N/A 08/27/2023   Procedure: INSERTION, ENDOVASCULAR STENT GRAFT, AORTA, ABDOMINAL, INSERTION RIGHT RENAL STENT;  Surgeon: Gretta Lonni PARAS, MD;  Location: [facility] OR;  Service: Vascular;  Laterality: N/A;   lithrotripsy     ULTRASOUND GUIDANCE FOR VASCULAR ACCESS Bilateral 08/27/2023   Procedure: ULTRASOUND GUIDANCE, FOR VASCULAR ACCESS;  Surgeon: Gretta Lonni PARAS, MD;  Location: [facility] OR;  Service: Vascular;  Laterality: Bilateral;   HPI:  Dennis Dominguez. is a 68 y.o. male who presented with a 2-day history of general and right-sided facial droop, numbness and weakness in RUE/R leg and dizziness.  Patient reported he was in his usual state of health until Friday 11/21 when he saw his PCP and received the COVID booster. After the shot, he began to feel a little bit fatigued and weak but did not seek care as he related symptoms to the vaccination.  On Saturday, his weakness persisted and he felt he was talking differently.  His friend convinced him to go to the ED on 03/29/24.  MRI revealed Acute infarction in the left ventral pons.  PMH of AAA s/p endovascular stent graft,  HTN/HLD, T2DM and AUD in remission.  ST consulted for a clinical swallow evaluation.  Pt passed the [facility] swallow screen.    Assessment / Plan / Recommendation  Clinical Impression  Recommend Dysphagia 3(mechanical soft)/thin liquid diet with general swallow precautions in place.  Medications provided whole with liquids as tolerated.  ST f/u at next venue of care for dysphagia tx and speech/language assessment as pt noted to be min dysarthric during speaking tasks and SLP schedule did not permit completion this session.  Pt seen for clinical swallow evaluation with R asymmetry during OME and decreased R sensory impairment with puree/solid consistency with pt requiring min verbal cues to clear residue from facial area.  No R buccal retention noted during trial, but suspect pt may exhibit this occurrence during full meal if fatigued depending on consistency.  Impaired mastication with solids noted d/t missing dentition.  Pt agreeable to Dysphagia 3 diet with thin liquids.  Discussed general swallowing precautions with pt to utilize during meals/snacks with acknowledgement noted.  No further f/u during acute stay.   SLP Visit Diagnosis: Dysphagia, oropharyngeal phase (R13.12)    Aspiration Risk  Mild aspiration risk    Diet Recommendation   Thin;Dysphagia 3 (mechanical soft)  Medication Administration: Whole meds with liquid    Other  Recommendations Oral Care Recommendations: Oral care BID     Assistance Recommended at Discharge  TBD  Functional Status Assessment Patient has had a recent decline in their  functional status and demonstrates the ability to make significant improvements in function in a reasonable and predictable amount of time.  Frequency and Duration            Prognosis Prognosis for improved oropharyngeal function: Good      Swallow Study   General HPI: Dennis Dominguez. is a 68 y.o. male who presented with a 2-day history of general and right-sided facial droop,  numbness and weakness in RUE/R leg and dizziness.  Patient reported he was in his usual state of health until Friday 11/21 when he saw his PCP and received the COVID booster. After the shot, he began to feel a little bit fatigued and weak but did not seek care as he related symptoms to the vaccination.  On Saturday, his weakness persisted and he felt he was talking differently.  His friend convinced him to go to the ED on 03/28/24.  MRI revealed Acute infarction in the left ventral pons.  PMH of AAA s/p endovascular stent graft, HTN/HLD, T2DM and AUD in remission.  ST consulted for a clinical swallow evaluation.  Pt passed the [facility] swallow screen. Type of Study: Bedside Swallow Evaluation Previous Swallow Assessment: 03/30/24 passed [facility] swallow screen Diet Prior to this Study: Regular;Thin liquids (Level 0) Temperature Spikes Noted: No Respiratory Status: Room air History of Recent Intubation: No Behavior/Cognition: Alert;Cooperative Oral Cavity Assessment: Within Functional Limits Oral Care Completed by SLP: No Oral Cavity - Dentition: Missing dentition;Other (Comment) (has limited natural dentition (few teeth)) Vision: Functional for self-feeding Self-Feeding Abilities: Able to feed self Patient Positioning: Upright in chair Baseline Vocal Quality: Low vocal intensity Volitional Cough: Strong Volitional Swallow: Able to elicit    Oral/Motor/Sensory Function Overall Oral Motor/Sensory Function: Mild impairment Facial Symmetry: Abnormal symmetry right Facial Sensation: Reduced right Lingual Symmetry: Abnormal symmetry right   Ice Chips Ice chips: Not tested   Thin Liquid Thin Liquid: Impaired Presentation: Cup;Straw Oral Phase Functional Implications: Right anterior spillage    Nectar Thick Nectar Thick Liquid: Not tested   Honey Thick Honey Thick Liquid: Not tested   Puree Puree: Impaired Presentation: Self Fed Oral Phase Functional Implications: Other (comment) (R sensory  impairment; needed cues to remove from R facial area)   Solid     Solid: Impaired Presentation: Self Fed Oral Phase Impairments: Impaired mastication Oral Phase Functional Implications: Impaired mastication;Prolonged oral transit      Pat Charline Hoskinson,M.S.,CCC-SLP 03/31/2024,3:39 PM

## 2024-03-31 NOTE — TOC CM/SW Note (Signed)
 Transition of Care [facility]) - Inpatient Brief Assessment   Patient Details  Name: Dennis Dominguez. MRN: [id number] Date of Birth: 14-Nov-1955  [facility] CM/SW Contact:    Almarie CHRISTELLA Goodie, LCSW Phone Number: 03/31/2024, 2:32 PM   Clinical Narrative:   Patient from home alone, positive for stroke. Current recommendation for CIR, they will evaluate. CSW to follow for needs.    Transition of Care Asessment: Insurance and Status: Insurance coverage has been reviewed Patient has primary care physician: Yes Home environment has been reviewed: Home alone Prior level of function:: Independent Prior/Current Home Services: No current home services Social Drivers of Health Review: SDOH reviewed no interventions necessary Readmission risk has been reviewed: Yes Transition of care needs: transition of care needs identified, TOC will continue to follow

## 2024-03-31 NOTE — Progress Notes (Signed)
 STROKE TEAM PROGRESS NOTE   INTERIM HISTORY/SUBJECTIVE No family at the bedside. Pt sitting in chair, stated that his R sided weakness has improved. He wants [facility] go [facility] which [facility] for [facility].   CBC    Component Value Date/Time   WBC 9.8 03/31/2024 0706   RBC 5.15 03/31/2024 0706   HGB 14.2 03/31/2024 0706   HCT 43.4 03/31/2024 0706   PLT 364 03/31/2024 0706   MCV 84.3 03/31/2024 0706   MCH 27.6 03/31/2024 0706   MCHC 32.7 03/31/2024 0706   RDW 14.6 03/31/2024 0706   LYMPHSABS 2.4 03/30/2024 1141   MONOABS 1.0 03/30/2024 1141   EOSABS 0.3 03/30/2024 1141   BASOSABS 0.1 03/30/2024 1141    BMET    Component Value Date/Time   NA 131 (L) 03/31/2024 0706   K 3.9 03/31/2024 0706   CL 97 (L) 03/31/2024 0706   CO2 25 03/31/2024 0706   GLUCOSE 94 03/31/2024 0706   BUN 14 03/31/2024 0706   CREATININE 0.99 03/31/2024 0706   CALCIUM  9.2 03/31/2024 0706   GFRNONAA >60 03/31/2024 0706    IMAGING past 24 hours CT ANGIO HEAD NECK W WO CM Result Date: 03/30/2024 EXAM: CTA HEAD AND NECK WITHOUT AND WITH 03/30/2024 07:21:34 PM TECHNIQUE: CTA of the head and neck was performed without and with the administration of 75 mL iohexol  (OMNIPAQUE ) 350 MG/ML intravenous contrast. Multiplanar 2D and/or 3D reformatted images are provided for review. Automated exposure control, iterative reconstruction, and/or weight based adjustment of the mA/kV was utilized to reduce the radiation dose to as low as reasonably achievable. Stenosis of the internal carotid arteries measured using NASCET criteria. COMPARISON: None available CLINICAL HISTORY: Stroke/TIA, determine embolic source. FINDINGS: CTA NECK: AORTIC ARCH AND ARCH VESSELS: Mild calcific aortic atherosclerosis. No dissection or arterial injury. No significant stenosis of the brachiocephalic or subclavian arteries. CERVICAL CAROTID ARTERIES: Mixed density atherosclerotic disease at the left carotid bifurcation with  less than 50% stenosis of the proximal left ICA. The right ICA is patent without significant stenosis or dissection. No dissection or arterial injury. CERVICAL VERTEBRAL ARTERIES: Mild atherosclerotic calcification of the V4 segment of the left vertebral artery. No dissection, arterial injury, or significant stenosis. LUNGS AND MEDIASTINUM: Unremarkable. SOFT TISSUES: No acute abnormality. BONES: No acute abnormality. CTA HEAD: ANTERIOR CIRCULATION: Mild atherosclerotic calcification of the cavernous segments of both internal carotid arteries. 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 No significant stenosis of the internal carotid arteries. No significant stenosis of the anterior cerebral arteries. No significant stenosis of the middle cerebral arteries. POSTERIOR CIRCULATION: No significant stenosis of the posterior cerebral arteries. No significant stenosis of the basilar artery. No significant stenosis of the vertebral arteries. No aneurysm. OTHER: No dural venous sinus thrombosis on this non-dedicated study. Complete opacification of the right maxillary sinus. Small amount of fluid in the left maxillary sinus. IMPRESSION: 1. No emergent large vessel occlusion 2. 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 3. Mixed density atherosclerotic disease at the left carotid bifurcation with less than 50% stenosis of the proximal ICA. 4. Mild atherosclerotic calcification of the V4 segment of the left vertebral artery and the cavernous segments of both internal carotid arteries. Electronically signed by: Franky Stanford MD 03/30/2024 07:49 PM EST RP Workstation: [id number]   MR BRAIN WO CONTRAST Result Date: 03/30/2024 CLINICAL DATA:  Neuro deficit, acute, stroke suspected EXAM: MRI HEAD WITHOUT CONTRAST TECHNIQUE: Multiplanar, multiecho pulse sequences of the brain and surrounding structures were obtained  without intravenous contrast. COMPARISON:  CT earlier same day. FINDINGS: Brain: Diffusion imaging shows acute infarction within the left ventral pons. Question small focus of acute infarction in the deep white matter adjacent to the posterior body of the left lateral ventricle. No large vessel territory stroke. Chronic small-vessel ischemic changes otherwise affect the brainstem. No focal cerebellar insult. Cerebral hemispheres show old small vessel infarctions of the thalami and basal ganglia and moderate to severe chronic small-vessel ischemic change of the hemispheric white matter. No mass, acute hemorrhage, hydrocephalus or extra-axial collection. There are a few punctate foci of hemosiderin deposition related to the old small vessel ischemic changes. Vascular: Major vessels at the base of the brain show flow. Skull and upper cervical spine: Negative Sinuses/Orbits: Inflammatory changes of both maxillary sinuses. Other sinuses are clear. Orbits negative. Other: None IMPRESSION: 1. Acute infarction in the left ventral pons. Question second small focus of acute infarction in the deep white matter adjacent to the posterior body of the left lateral ventricle. 2. Extensive chronic small-vessel ischemic changes elsewhere throughout the brain as outlined above. Electronically Signed   By: Oneil Officer M.D.   On: 03/30/2024 16:50    Vitals:   03/30/24 1536 03/30/24 1709 03/30/24 1918 03/31/24 1107  BP:  (!) 162/94 (!) 158/69 138/84  Pulse:  91 67 95  Resp:  18 18 20   Temp: 98.1 F (36.7 C) 99.9 F (37.7 C) 98.5 F (36.9 C) 98 F (36.7 C)  TempSrc: Oral Oral Oral Oral  SpO2:  96% 98% 95%     PHYSICAL EXAM General:  Alert, well-nourished, well-developed patient in no acute distress Psych:  Mood and affect appropriate for situation CV: Regular rate and rhythm on monitor Respiratory:  Regular, unlabored respirations on room air GI: Abdomen soft and nontender Neuro - awake, alert, eyes open,  orientated to age, place, time. No aphasia, fluent language but mild to moderate dysarthria, but following all simple commands. Able to name and repeat in dysarthric voice. No gaze palsy, tracking bilaterally, visual field full. R facial droop. Tongue midline. LUE 5/5, RUE proximal 5-/5 with mild pronator drift, distally 4/5 with hand grip. LLE 5/5, RLE 4/5 proximal and 4+/5 distally. Sensation symmetrical bilaterally, b/l FTN intact, gait not tested.    ASSESSMENT/PLAN  Mr. Dennis Dominguez. is a 68 y.o. male with history of   HTN, HLD, COPD, DM2, AAA, asthma, anxiety who presented to ED due to slurred speech and right-sided weakness. NIH on Admission 2  Stroke:  left ventral pons acute infarct, subacute L posterior periventricular and SO small subacute/late subacute infarct Etiology: Small vessel disease Code Stroke  CT head No acute abnormality.  Small vessel disease. ASPECTS 10. age indeterminate involvement of the right thalamus.   CTA head & neck no LVO. B/l siphon atherosclerosis, mixed density atherosclerotic disease at the left carotid bifurcation with less than 50% stenosis of the proximal ICA. MRI Acute infarction in the left ventral pons. Question second small focus of acute infarction in the deep white matter adjacent to the posterior body of the left lateral ventricle.  2D Echo EF 65-70% LDL 99 HgbA1c 6.0 UDS neg VTE prophylaxis -Lovenox  aspirin  81 mg daily and clopidogrel  75 mg daily (not sure compliance) prior to admission, now on home ASA and plavix  DAPT.  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 Will need outpatient follow-up after discharge with Dr. Lester IR  Hypertension Home meds: Amlodipine  5 mg, lisinopril  40 mg Stable on the high end On home BP meds Long term Goal normotension  Hyperlipidemia Home meds: Crestor  40 mg (not  sure compliance)  LDL 99, goal < 70 Resumed home crestor  40 Continue statin at discharge  Diabetes type II Controlled Home meds: Metformin HgbA1c 6.0, goal < 7.0 CBGs SSI Recommend close follow-up with PCP for better DM control  Tobacco Abuse Patient smokes 1-1/2 packs per day       Ready to quit? No Nicotine  replacement therapy provided  Other Stroke Risk Factors Advanced age  Other Active Problems COPD AAA Anxiety  Hospital day # 0  Neurology will sign off. Please call with questions. Pt will follow up with stroke clinic NP at [facility] or neurology in [place] in about 4 weeks. Thanks for the consult.  Ary Cummins, MD PhD Stroke Neurology 03/31/2024 5:51 PM     To contact Stroke Continuity provider, please refer to [email or web address]. After hours, contact General Neurology

## 2024-03-31 NOTE — Progress Notes (Signed)
 Inpatient Rehab Admissions Coordinator Note:   Per therapy recommendations patient was screened for CIR candidacy by Reche FORBES Lowers, PT. At this time, pt appears to be a potential candidate for CIR. I will place an order for rehab consult for full assessment, per our protocol.  Please contact me any with questions.SABRA Reche Lowers, PT, DPT [phone] 03/31/24 1:51 PM

## 2024-03-31 NOTE — Progress Notes (Addendum)
 HD#0 SUBJECTIVE:  Patient Summary: Dennis Dominguez. is a 68 y.o. male with PMH of AAA s/p endovascular stent graft, HTN/HLD, T2DM, and AUD in remission who presents with a 2-day history of general and right-sided weakness and who is admitted for stroke workup and found to have a left ventral pontine infarction.    Overnight Events: none  Interim History: Patient reports doing okay overnight. He is eating, drinking, and voiding appropriately. He endorses some frustration with the bed alarm as he says he is very careful getting up. He feels he is 50% of his baseline in terms of overall function. He also mentioned that he feels a little dizzy when standing. He has no other concerns.  OBJECTIVE:  Vital Signs: Vitals:   03/30/24 1530 03/30/24 1536 03/30/24 1709 03/30/24 1918  BP: (!) 174/98  (!) 162/94 (!) 158/69  Pulse: 90  91 67  Resp: 20  18 18   Temp:  98.1 F (36.7 C) 99.9 F (37.7 C) 98.5 F (36.9 C)  TempSrc:  Oral Oral Oral  SpO2: 98%  96% 98%   Supplemental O2: Room Air SpO2: 98 %  There were no vitals filed for this visit.  No intake or output data in the 24 hours ending 03/31/24 0617 Net IO Since Admission: No IO data has been entered for this period [03/31/24 0617]  Physical Exam:  Constitutional: well-appearing male patient sitting up in exam chair, in no acute distress HEENT: normocephalic atraumatic, mucous membranes moist. Poor dentition.  Eyes: conjunctiva non-erythematous Cardiovascular: regular rate and rhythm, no murmurs.  Pulmonary/Chest: normal work of breathing on room air, lungs clear to auscultation bilaterally Abdominal: non-distended MSK: normal bulk and tone. Neuro: Per Attending attestation on H&P Skin: warm and dry, no ulcers or lesions on bilateral feet Psych: mood anxious, behavior normal, thought content normal, judgement normal   Patient Lines/Drains/Airways Status     Active Line/Drains/Airways     Name Placement date Placement time  Site Days   Peripheral IV 03/30/24 18 G Left Antecubital 03/30/24  1215  Antecubital  1   External Urinary Catheter 03/30/24  1820  --  1           Pertinent labs and imaging:     Latest Ref Rng & Units 03/30/2024   11:55 AM 03/30/2024   11:41 AM 08/28/2023    5:42 AM  CBC  WBC 4.0 - 10.5 K/uL  11.2  16.3   Hemoglobin 13.0 - 17.0 g/dL 82.9  84.2  87.8   Hematocrit 39.0 - 52.0 % 50.0  49.0  37.1   Platelets 150 - 400 K/uL  393  343        Latest Ref Rng & Units 03/30/2024   11:55 AM 03/30/2024   11:41 AM 08/28/2023    5:42 AM  CMP  Glucose 70 - 99 mg/dL 889  95  877   BUN 8 - 23 mg/dL 13  12  17    Creatinine 0.61 - 1.24 mg/dL 8.89  8.93  8.90   Sodium 135 - 145 mmol/L 137  136  134   Potassium 3.5 - 5.1 mmol/L 4.5  4.6  4.4   Chloride 98 - 111 mmol/L 107  103  102   CO2 22 - 32 mmol/L  19  22   Calcium  8.9 - 10.3 mg/dL  9.9  8.9   Total Protein 6.5 - 8.1 g/dL  9.0    Total Bilirubin 0.0 - 1.2 mg/dL  0.4  Alkaline Phos 38 - 126 U/L  63    AST 15 - 41 U/L  22    ALT 0 - 44 U/L  21      CT ANGIO HEAD NECK W WO CM Result Date: 03/30/2024 EXAM: CTA HEAD AND NECK WITHOUT AND WITH 03/30/2024 07:21:34 PM TECHNIQUE: CTA of the head and neck was performed without and with the administration of 75 mL iohexol  (OMNIPAQUE ) 350 MG/ML intravenous contrast. Multiplanar 2D and/or 3D reformatted images are provided for review. Automated exposure control, iterative reconstruction, and/or weight based adjustment of the mA/kV was utilized to reduce the radiation dose to as low as reasonably achievable. Stenosis of the internal carotid arteries measured using NASCET criteria. COMPARISON: None available CLINICAL HISTORY: Stroke/TIA, determine embolic source. FINDINGS: CTA NECK: AORTIC ARCH AND ARCH VESSELS: Mild calcific aortic atherosclerosis. No dissection or arterial injury. No significant stenosis of the brachiocephalic or subclavian arteries. CERVICAL CAROTID ARTERIES: Mixed density  atherosclerotic disease at the left carotid bifurcation with less than 50% stenosis of the proximal left ICA. The right ICA is patent without significant stenosis or dissection. No dissection or arterial injury. CERVICAL VERTEBRAL ARTERIES: Mild atherosclerotic calcification of the V4 segment of the left vertebral artery. No dissection, arterial injury, or significant stenosis. LUNGS AND MEDIASTINUM: Unremarkable. SOFT TISSUES: No acute abnormality. BONES: No acute abnormality. CTA HEAD: ANTERIOR CIRCULATION: Mild atherosclerotic calcification of the cavernous segments of both internal carotid arteries. 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 No significant stenosis of the internal carotid arteries. No significant stenosis of the anterior cerebral arteries. No significant stenosis of the middle cerebral arteries. POSTERIOR CIRCULATION: No significant stenosis of the posterior cerebral arteries. No significant stenosis of the basilar artery. No significant stenosis of the vertebral arteries. No aneurysm. OTHER: No dural venous sinus thrombosis on this non-dedicated study. Complete opacification of the right maxillary sinus. Small amount of fluid in the left maxillary sinus. IMPRESSION: 1. No emergent large vessel occlusion 2. 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 3. Mixed density atherosclerotic disease at the left carotid bifurcation with less than 50% stenosis of the proximal ICA. 4. Mild atherosclerotic calcification of the V4 segment of the left vertebral artery and the cavernous segments of both internal carotid arteries. Electronically signed by: Franky Stanford MD 03/30/2024 07:49 PM EST RP Workstation: [id number]   MR BRAIN WO CONTRAST Result Date: 03/30/2024 CLINICAL DATA:  Neuro deficit, acute, stroke suspected EXAM: MRI HEAD WITHOUT CONTRAST TECHNIQUE: Multiplanar, multiecho pulse  sequences of the brain and surrounding structures were obtained without intravenous contrast. COMPARISON:  CT earlier same day. FINDINGS: Brain: Diffusion imaging shows acute infarction within the left ventral pons. Question small focus of acute infarction in the deep white matter adjacent to the posterior body of the left lateral ventricle. No large vessel territory stroke. Chronic small-vessel ischemic changes otherwise affect the brainstem. No focal cerebellar insult. Cerebral hemispheres show old small vessel infarctions of the thalami and basal ganglia and moderate to severe chronic small-vessel ischemic change of the hemispheric white matter. No mass, acute hemorrhage, hydrocephalus or extra-axial collection. There are a few punctate foci of hemosiderin deposition related to the old small vessel ischemic changes. Vascular: Major vessels at the base of the brain show flow. Skull and upper cervical spine: Negative Sinuses/Orbits: Inflammatory changes of both maxillary sinuses. Other sinuses are clear. Orbits negative. Other: None IMPRESSION: 1. Acute infarction  in the left ventral pons. Question second small focus of acute infarction in the deep white matter adjacent to the posterior body of the left lateral ventricle. 2. Extensive chronic small-vessel ischemic changes elsewhere throughout the brain as outlined above. Electronically Signed   By: Oneil Officer M.D.   On: 03/30/2024 16:50   CT HEAD CODE STROKE WO CONTRAST Result Date: 03/30/2024 EXAM: CT HEAD WITHOUT 03/30/2024 11:52:00 AM TECHNIQUE: CT of the head was performed without the administration of intravenous contrast. Automated exposure control, iterative reconstruction, and/or weight based adjustment of the mA/kV was utilized to reduce the radiation dose to as low as reasonably achievable. COMPARISON: Temporal bone CT 12/20/2009. CLINICAL HISTORY: 68 year old male with acute neuro deficit, stroke suspected. FINDINGS: BRAIN AND VENTRICLES: No acute  intracranial hemorrhage. No mass effect or midline shift. No extra-axial fluid collection. No evidence of acute infarct. No hydrocephalus. Patchy and moderate asymmetric mostly periventricular white matter hypodensity in both hemispheres. Small age indeterminate right caudate lacunar infarct series 2 image 18. Similar small age indeterminate hypodensity in the central right thalamus on image 16. No suspicious intracranial vascular hyperdensity. On sagittal image 25 the caudate lacune appears circumscribed and chronic. Calcified atherosclerosis at the skull base. No gaze deviation. ORBITS: No acute abnormality. SINUSES AND MASTOIDS: Chronic appearing right maxillary sinus disease with mucoperiosteal thickening. Trace fluid in the left maxillary sinus. Other paranasal sinuses, middle ears and mastoids are well aerated. SOFT TISSUES AND SKULL: No acute skull fracture. No acute soft tissue abnormality. [place] Stroke Program Early CT Score (ASPECTS) ----- Ganglionic (caudate, ic, lentiform nucleus, insula, M1-m3): 7 Supraganglionic (m4-m6): 3 Total: 10 IMPRESSION: 1. No acute intracranial hemorrhage or cortically based infarct. ASPECTS 10. 2. Moderate for age small vessel disease, including age indeterminate involvement of the right thalamus. 3. These results were communicated to Dr Matthews at 11:59 hours on 03/30/2024 by text page via the [facility] messaging system. Electronically signed by: Helayne Hurst MD 03/30/2024 12:00 PM EST RP Workstation: [id number]    ASSESSMENT/PLAN:  Assessment: Principal Problem:   Left pontine stroke (HCC) Active Problems:   Hyperlipidemia   Essential hypertension   H/O endovascular stent graft for abdominal aortic aneurysm   Anxiety  Dennis Dominguez. is a 68 y.o. male with PMH of AAA s/p endovascular stent graft, HTN/HLD, T2DM, and AUD in remission who presents with a 2-day history of general and right-sided weakness and who is admitted for stroke workup and found to have a  left ventral pontine infarction and doing fairly well.     Left Ventral Pontine Infarction -Patient had initial CT scan concerning for age indeterminate small vessel disease of thalamus and now has MRI showing acute infarction of left ventral pons with possible infarct in deep white matter neat posterior body of left lateral ventricle.  Thrombolytics were not given as he was well out of window. Also out of window for permissive hypertension. Patient currently hemodynamically stable and doing overall well.  -Does endorse some dizziness when standing up so we will obtain orthostatic vitals to make sure we are not dropping his BP too much on his current meds before potentially increasing his dose.   On his neuro exam, he has only slightly diminished strength on his right side but subjectively feels. Remains slightly dysarthric. TTE obtained showed Grade 1 diastolic dysfunction, no mitral or aortic regurgitation appreciated and no clear etiology for cardiac embolic source to stroke. Neuro to follow with any further recommendation and appreciate their expertise.   - Continue  aspirin  81mg /Plavix  75mg /rosuvastatin  40mg  - Continue Amlodipine , Lisinopril .  - As the patient presented with significant hypertension, suspect she is not compliant with at home medications.  As the patient is dizzy today upon standing, suspect antihypertensives are contributing.  Will continue current management and adjust as necessary. - PT/OT believe CIR will be beneficial for patient and this can be accomplished with his [facility] insurance if patient amenable.   HTN/HLD H/O AAA endovascular stent graft (08/27/23) Patient has essential HTN managed with Lisinopril  40mg  and Amlodipine  5mg  at home who presented. Yesterday, blood pressure did come down as his MRI was completed and the medical team left the room.  Suspect anxiety is contributing greatly to his blood pressure. His pressures today have been in 150s/60-70s. Would benefit from  better control but some question as to whether he is symptomatic from his lower pressures despite remaining elevated. Obtaining orthostatic vitals to assess if it is BP causing dizziness vs sequela of stroke.  -Continue Amlodipine  5mg , consider increase to 10mg  -Continue Lisinopril  40mg , consider switch to olmasartan  -Consider adding thiazide if not orthostatic  -Continue plavix  75mg  -Continue Rosuvastatin  40mg  -Add Zetia  10mg  daily   Toxic metabolic encephalopathy While patient was initially very confused on initial evaluation, he appears less so on exam today. He is a little frustrated about his current situation which is understandable. Reassuringly, he does not have any electrolyte abnormalities, and TSH is normal. B12 borderline low so checking MMA. Lower likelihood for acute encephalopathy but worth considering korsakoff syndrome in setting of prolonged AUD even though not currently using.  - Check MMA as B12 is low  T2DM Takes metformin at home. Stable with last A1c of 6 on 03/30/24. Will hold metformin and start him on SSI. Glucose 97 this morning. Will continue to monitor CBG for hypoglycemia. -Hold Metformin -initiate SSI   Anxiety Doing okay from this standpoint; stable. Worth following up with PCP outpatient to see if amitriptyline  is appropriate pharmacotherapy for him. Has been held during admission. He has Atarax  prn but has not taken this yet. -Atarax  10mg  TID prn anxiety  Best Practice: Diet: Thin fluids IVF: Fluids: None VTE: enoxaparin  (LOVENOX ) injection 40 mg Start: 03/31/24 1000 SCDs Start: 03/30/24 1455 Code: Full  Disposition planning: Therapy Recs: Pending Family Contact: Brother Elnora), to be notified. DISPO: Anticipated discharge today to Home pending physical therapy evaluation.  Signature: Leonor BROCKS. [employer] Student   I was personally present and re-performed the exam and medical decision making and verified the service and findings are  accurately documented in the student's note.  Schuyler Novak, DO 03/31/2024 2:40 PM

## 2024-03-31 NOTE — Progress Notes (Signed)
 Inpatient Rehab Coordinator Note:  I met with patient at bedside and spoke to his friend Dennis Dominguez on the phone to discuss CIR recommendations and goals/expectations of CIR stay.  We reviewed 3 hrs/day of therapy, physician follow up, and average length of stay 2 weeks (dependent upon progress) with goals of supervision. According to Dennis Dominguez and patient he really wants to go home and does not want to pursue CIR admission. Other discharge dispositions will need to be looked into. Signing off.   Rehab Admissons Coordinator Roxas Clymer, , [place] [phone]

## 2024-03-31 NOTE — Plan of Care (Signed)
  Problem: Education: Goal: Knowledge of disease or condition will improve 03/31/2024 0606 by Vicci Fass, RN Outcome: Progressing 03/30/2024 2154 by Vicci Fass, RN Outcome: Progressing Goal: Knowledge of secondary prevention will improve (MUST DOCUMENT ALL) 03/31/2024 0606 by Vicci Fass, RN Outcome: Progressing 03/30/2024 2154 by Vicci Fass, RN Outcome: Progressing Goal: Knowledge of patient specific risk factors will improve (DELETE if not current risk factor) 03/31/2024 0606 by Vicci Fass, RN Outcome: Progressing 03/30/2024 2154 by Vicci Fass, RN Outcome: Progressing   Problem: Ischemic Stroke/TIA Tissue Perfusion: Goal: Complications of ischemic stroke/TIA will be minimized 03/31/2024 0606 by Vicci Fass, RN Outcome: Progressing 03/30/2024 2154 by Vicci Fass, RN Outcome: Progressing   Problem: Coping: Goal: Will verbalize positive feelings about self 03/31/2024 0606 by Vicci Fass, RN Outcome: Progressing 03/30/2024 2154 by Vicci Fass, RN Outcome: Progressing Goal: Will identify appropriate support needs 03/31/2024 0606 by Vicci Fass, RN Outcome: Progressing 03/30/2024 2154 by Vicci Fass, RN Outcome: Progressing

## 2024-03-31 NOTE — Progress Notes (Deleted)
  Echocardiogram 2D Echocardiogram has been performed.  Koleen KANDICE Popper, RDCS 03/31/2024, 12:14 PM

## 2024-04-01 ENCOUNTER — Other Ambulatory Visit (HOSPITAL_COMMUNITY): Payer: Self-pay

## 2024-04-01 DIAGNOSIS — G928 Other toxic encephalopathy: Secondary | ICD-10-CM

## 2024-04-01 DIAGNOSIS — I720 Aneurysm of carotid artery: Secondary | ICD-10-CM

## 2024-04-01 LAB — BASIC METABOLIC PANEL WITH GFR
Anion gap: 13 (ref 5–15)
BUN: 16 mg/dL (ref 8–23)
CO2: 24 mmol/L (ref 22–32)
Calcium: 9.7 mg/dL (ref 8.9–10.3)
Chloride: 99 mmol/L (ref 98–111)
Creatinine, Ser: 1.04 mg/dL (ref 0.61–1.24)
GFR, Estimated: >60 mL/min (ref >60)
Glucose, Bld: 124 mg/dL — ABNORMAL HIGH (ref 70–99)
Potassium: 4.1 mmol/L (ref 3.5–5.1)
Sodium: 136 mmol/L (ref 135–145)

## 2024-04-01 LAB — GLUCOSE, CAPILLARY
Glucose-Capillary: 130 mg/dL — ABNORMAL HIGH (ref 70–99)
Glucose-Capillary: 135 mg/dL — ABNORMAL HIGH (ref 70–99)

## 2024-04-01 MED ORDER — EZETIMIBE 10 MG PO TABS
10.0000 mg | ORAL_TABLET | Freq: Every day | ORAL | 0 refills | Status: AC
  Filled 2024-04-01: qty 30, 30d supply, fill #0

## 2024-04-01 NOTE — Discharge Summary (Signed)
 Name: Dennis Dominguez. MRN: [id number] DOB: 27-Sep-1955 68 y.o. PCP: [name]  Date of Admission: 03/30/2024 11:32 AM Date of Discharge: 04/01/2024 Attending Physician: Dr. Mliss Dominguez  Discharge Diagnosis: 1. Principal Problem:   Left pontine stroke [facility]) Active Problems:   Hyperlipidemia   Essential hypertension   H/O endovascular stent graft for abdominal aortic aneurysm   Anxiety   Tobacco use disorder   CVA (cerebral vascular accident) [facility])   Discharge Medications: Allergies as of 04/01/2024       Reactions   Penicillin G Shortness Of Breath   Other Reaction(s): Edema, Dyspnea   Penicillins Anaphylaxis   Has patient had a PCN reaction causing immediate rash, facial/tongue/throat swelling, SOB or lightheadedness with hypotension: Yes Has patient had a PCN reaction causing severe rash involving mucus membranes or skin necrosis: Yes Has patient had a PCN reaction that required hospitalization: Yes Has patient had a PCN reaction occurring within the last 10 years: No If all of the above answers are NO, then may proceed with Cephalosporin use.   Bactrim  [sulfamethoxazole -trimethoprim ] Rash        Medication List     PAUSE taking these medications    amitriptyline  50 MG tablet Wait to take this until your doctor or other care provider tells you to start again. Commonly known as: ELAVIL  Take 50 mg by mouth at bedtime.       STOP taking these medications    cyclobenzaprine 10 MG tablet Commonly known as: FLEXERIL   gabapentin  300 MG capsule Commonly known as: NEURONTIN    ibuprofen  200 MG tablet Commonly known as: ADVIL        TAKE these medications    albuterol  108 (90 Base) MCG/ACT inhaler Commonly known as: VENTOLIN  HFA Inhale 2 puffs into the lungs every 6 (six) hours as needed for wheezing or shortness of breath.   amLODipine  5 MG tablet Commonly known as: NORVASC  Take 5 mg by mouth daily.   aspirin  EC 81 MG  tablet Take 162 mg by mouth daily with breakfast.   cholecalciferol 25 MCG (1000 UNIT) tablet Commonly known as: VITAMIN D3 Take 1,000 Units by mouth daily.   clopidogrel  75 MG tablet Commonly known as: PLAVIX  Take 1 tablet (75 mg total) by mouth daily.   ezetimibe  10 MG tablet Commonly known as: ZETIA  Take 1 tablet (10 mg total) by mouth daily. Start taking on: April 02, 2024   lisinopril  40 MG tablet Commonly known as: ZESTRIL  Take 40 mg by mouth daily.   metFORMIN 1000 MG tablet Commonly known as: GLUCOPHAGE Take 1,000 mg by mouth 2 (two) times daily with a meal.   rosuvastatin  40 MG tablet Commonly known as: CRESTOR  Take 40 mg by mouth daily.        Disposition and follow-up:   Mr.Dennis Dominguez. was discharged from [facility] in Stable condition.  At the hospital follow up visit please address:  S/p Pontine CVA--ensure he was able to set up with neurology at the [facility] and is taking aspirin  and plavix  as instructed.    Right sided weakness--pt was set up with [facility] PT/OT upon discharge. Ensure this was completed and he is receiving therapy. HTN--He was restarted on lisinopril  40mg  and amlodipine  5mg  upon discharge (he was not taking these at home). Ensure BP is at goal and he is not orthostatic.  Incidental finding of Right ICA Aneurysm--Pt should call Dr. Lester with Neuro IR for further evaluation.    Follow-up Appointments:  Contact information for follow-up providers      [facility]. Schedule an appointment as soon as possible for a visit in 1 month(s).   Specialty: Neurology Why: stroke clinic Contact information: [address] [place]  [address] [phone]             Contact information for after-discharge care     Home Medical Care     [facility] [place] [facility]) .   Service: Home Health Services Contact information: [address] [place]  [place]  [address] [phone]                      Hospital Course by problem list: Dennis Dominguez. is a 68 y.o. person living with a history of AAA, HTN/HLD, T2DM, and AUD in remission who presented with R sided weakness and admitted for L pontine CVA now being discharged on hospital day 1 with the following pertinent hospital course:  Left Ventral Pontine Infarction Patient had CT scan concerning for age indeterminate small vessel disease of thalamus and presented with severe hypertension and deficits suggestive of CVA. Thrombolytics were not given as he was well out of window by time of encounter. Subsequent MRI showed acute infarction of left ventral pons with very small infarct in deep white matter neat posterior body of left lateral ventricle. TTE obtained showed no clear etiology for cardiac embolic source to stroke. On his neuro exam, he had only slightly diminished strength on his right side and was slightly dysarthric. Exam remained unchanged. No neuro status changes through admission. Patient reported feeling better with mobility and using walker by discharge. Neuro signed off and [facility] 4 weeks. PT/OT recommended CIR due to ataxia and motor deficits, however he declined. He was discharged on DAPT with PT/OT home health. Zetia  was added to home regimen for cholesterol goal.   HTN/HLD H/O AAA endovascular stent graft (08/27/23) Patient has essential HTN managed with Lisinopril  40mg  and Amlodipine  5mg  at home who presented with very elevated BP to a high of 179/136. Suspect the patient does not take medications at home regularly. Home medications were re-initiated with BP of 130s/70-80s by time of discharge. He was initially exhibiting orthostatic hypotension, however this resolved within 24 hours. On 11/25 he was medically stable for discharge and was discharge on home regimen.  Toxic metabolic  encephalopathy Patient was noticeably confused and easily agitated on initial exam and had difficulty explaining location but was alert and oriented to self, year, and president. He does have a very extensive history of alcohol use disorder so workup for metabolic encephalopathy was initiated and no acute electrolyte or vitamin abnormalities noted. Suspect a component of alcohol related brain damage as he also had notable ataxia. Patient following up with neurology in one month where they can evaluate this further.  Incidental finding of Right ICA Aneurysm Patient had ct angio head and neck which revealed the above finding, unrelated to his stroke and clinical presentation. Neurology recommends follow-up with [facility]. He was given Dr. Wendall information upon discharge.   Stable chronic medical conditions: T2DM AAA s/p stent  Subjective The patient was seen and examined at the bedside this morning. He was feeling much better overall. He is looking forward to going home today.   Discharge Exam:   BP 133/81 (BP Location: Right Arm)   Pulse 89   Temp 97.8 F (36.6 C) (  Oral)   Resp 20   SpO2 97%  Discharge exam:  Const: Awake, alert in NAD HENT: Normocephalic, atraumatic, mucus membranes moist Eyes: PERRL Card: RRR, No MRG, No pitting edema on LE's bilaterally  Resp: LCTAB, no increased work of breathing Abd: Soft, NTND, Bsx4 Extremities: Warm, pink Neuro: Mild R facial droop. CN II-XII intact.    Pertinent Labs, Studies, and Procedures:     Latest Ref Rng & Units 03/31/2024    7:06 AM 03/30/2024   11:55 AM 03/30/2024   11:41 AM  CBC  WBC 4.0 - 10.5 K/uL 9.8   11.2   Hemoglobin 13.0 - 17.0 g/dL 85.7  82.9  84.2   Hematocrit 39.0 - 52.0 % 43.4  50.0  49.0   Platelets 150 - 400 K/uL 364   393        Latest Ref Rng & Units 04/01/2024    7:36 AM 03/31/2024    7:06 AM 03/30/2024   11:55 AM  CMP  Glucose 70 - 99 mg/dL 875  94  889   BUN 8 - 23 mg/dL 16  14  13    Creatinine 0.61 -  1.24 mg/dL 8.95  9.00  8.89   Sodium 135 - 145 mmol/L 136  131  137   Potassium 3.5 - 5.1 mmol/L 4.1  3.9  4.5   Chloride 98 - 111 mmol/L 99  97  107   CO2 22 - 32 mmol/L 24  25    Calcium  8.9 - 10.3 mg/dL 9.7  9.2    Total Protein 6.5 - 8.1 g/dL  7.8    Total Bilirubin 0.0 - 1.2 mg/dL  0.9    Alkaline Phos 38 - 126 U/L  56    AST 15 - 41 U/L  19    ALT 0 - 44 U/L  26      ECHOCARDIOGRAM COMPLETE Result Date: 03/31/2024    ECHOCARDIOGRAM REPORT   Patient Name:   Adyan Palau. Date of Exam: 03/31/2024 Medical Rec #:  [id number]             Height:       69.0 in Accession #:    [id number]            Weight:       237.8 lb Date of Birth:  1955/10/03             BSA:          2.224 m Patient Age:    68 years              BP:           158/69 mmHg Patient Gender: M                     HR:           93 bpm. Exam Location:  Inpatient Procedure: 2D Echo, Cardiac Doppler and Color Doppler (Both Spectral and Color            Flow Doppler were utilized during procedure). Indications:    Stroke I63.9  History:        Patient has no prior history of Echocardiogram examinations.                 Stroke and COPD; Risk Factors:Hypertension, Dyslipidemia,                 Diabetes and Current Smoker.  Sonographer:    Koleen Popper  RDCS Referring Phys: ERIN C LEHNER  Sonographer Comments: Image acquisition challenging due to COPD. IMPRESSIONS  1. Left ventricular ejection fraction, by estimation, is 65 to 70%. The left ventricle has normal function. The left ventricle has no regional wall motion abnormalities. There is moderate concentric left ventricular hypertrophy. Left ventricular diastolic parameters are consistent with Grade I diastolic dysfunction (impaired relaxation).  2. Right ventricular systolic function is normal. The right ventricular size is normal.  3. The mitral valve is normal in structure. No evidence of mitral valve regurgitation. No evidence of mitral stenosis.  4. The aortic valve is normal  in structure. Aortic valve regurgitation is not visualized. Aortic valve sclerosis is present, with no evidence of aortic valve stenosis.  5. The inferior vena cava is normal in size with greater than 50% respiratory variability, suggesting right atrial pressure of 3 mmHg. FINDINGS  Left Ventricle: Left ventricular ejection fraction, by estimation, is 65 to 70%. The left ventricle has normal function. The left ventricle has no regional wall motion abnormalities. The left ventricular internal cavity size was normal in size. There is  moderate concentric left ventricular hypertrophy. Left ventricular diastolic parameters are consistent with Grade I diastolic dysfunction (impaired relaxation). Right Ventricle: The right ventricular size is normal. No increase in right ventricular wall thickness. Right ventricular systolic function is normal. Left Atrium: Left atrial size was normal in size. Right Atrium: Right atrial size was normal in size. Pericardium: There is no evidence of pericardial effusion. Mitral Valve: The mitral valve is normal in structure. No evidence of mitral valve regurgitation. No evidence of mitral valve stenosis. Tricuspid Valve: The tricuspid valve is normal in structure. Tricuspid valve regurgitation is not demonstrated. No evidence of tricuspid stenosis. Aortic Valve: The aortic valve is normal in structure. Aortic valve regurgitation is not visualized. Aortic valve sclerosis is present, with no evidence of aortic valve stenosis. Pulmonic Valve: The pulmonic valve was normal in structure. Pulmonic valve regurgitation is not visualized. No evidence of pulmonic stenosis. Aorta: The aortic root is normal in size and structure. Venous: The inferior vena cava is normal in size with greater than 50% respiratory variability, suggesting right atrial pressure of 3 mmHg. IAS/Shunts: No atrial level shunt detected by color flow Doppler.  LEFT VENTRICLE PLAX 2D LVIDd:         4.60 cm   Diastology LVIDs:          2.90 cm   LV e' medial:    7.29 cm/s LV PW:         1.70 cm   LV E/e' medial:  8.2 LV IVS:        1.60 cm   LV e' lateral:   6.20 cm/s LVOT diam:     2.00 cm   LV E/e' lateral: 9.6 LV SV:         57 LV SV Index:   26 LVOT Area:     3.14 cm  RIGHT VENTRICLE             IVC RV S prime:     15.20 cm/s  IVC diam: 1.60 cm TAPSE (M-mode): 1.9 cm LEFT ATRIUM             Index LA diam:        3.80 cm 1.71 cm/m LA Vol (A2C):   33.2 ml 14.93 ml/m LA Vol (A4C):   57.6 ml 25.90 ml/m LA Biplane Vol: 44.2 ml 19.88 ml/m  AORTIC VALVE LVOT Vmax:   118.00 cm/s  LVOT Vmean:  73.900 cm/s LVOT VTI:    0.183 m  AORTA Ao Root diam: 3.70 cm Ao Asc diam:  3.60 cm MITRAL VALVE MV Area (PHT): 3.89 cm    SHUNTS MV Decel Time: 195 msec    Systemic VTI:  0.18 m MV E velocity: 59.50 cm/s  Systemic Diam: 2.00 cm MV A velocity: 96.70 cm/s MV E/A ratio:  0.62 Morene Brownie Electronically signed by Morene Brownie Signature Date/Time: 03/31/2024/12:49:31 PM    Final    CT ANGIO HEAD NECK W WO CM Result Date: 03/30/2024 EXAM: CTA HEAD AND NECK WITHOUT AND WITH 03/30/2024 07:21:34 PM TECHNIQUE: CTA of the head and neck was performed without and with the administration of 75 mL iohexol  (OMNIPAQUE ) 350 MG/ML intravenous contrast. Multiplanar 2D and/or 3D reformatted images are provided for review. Automated exposure control, iterative reconstruction, and/or weight based adjustment of the mA/kV was utilized to reduce the radiation dose to as low as reasonably achievable. Stenosis of the internal carotid arteries measured using NASCET criteria. COMPARISON: None available CLINICAL HISTORY: Stroke/TIA, determine embolic source. FINDINGS: CTA NECK: AORTIC ARCH AND ARCH VESSELS: Mild calcific aortic atherosclerosis. No dissection or arterial injury. No significant stenosis of the brachiocephalic or subclavian arteries. CERVICAL CAROTID ARTERIES: Mixed density atherosclerotic disease at the left carotid bifurcation with less than 50% stenosis of  the proximal left ICA. The right ICA is patent without significant stenosis or dissection. No dissection or arterial injury. CERVICAL VERTEBRAL ARTERIES: Mild atherosclerotic calcification of the V4 segment of the left vertebral artery. No dissection, arterial injury, or significant stenosis. LUNGS AND MEDIASTINUM: Unremarkable. SOFT TISSUES: No acute abnormality. BONES: No acute abnormality. CTA HEAD: ANTERIOR CIRCULATION: Mild atherosclerotic calcification of the cavernous segments of both internal carotid arteries. 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 No significant stenosis of the internal carotid arteries. No significant stenosis of the anterior cerebral arteries. No significant stenosis of the middle cerebral arteries. POSTERIOR CIRCULATION: No significant stenosis of the posterior cerebral arteries. No significant stenosis of the basilar artery. No significant stenosis of the vertebral arteries. No aneurysm. OTHER: No dural venous sinus thrombosis on this non-dedicated study. Complete opacification of the right maxillary sinus. Small amount of fluid in the left maxillary sinus. IMPRESSION: 1. No emergent large vessel occlusion 2. 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 3. Mixed density atherosclerotic disease at the left carotid bifurcation with less than 50% stenosis of the proximal ICA. 4. Mild atherosclerotic calcification of the V4 segment of the left vertebral artery and the cavernous segments of both internal carotid arteries. Electronically signed by: Franky Stanford MD 03/30/2024 07:49 PM EST RP Workstation: [id number]   MR BRAIN WO CONTRAST Result Date: 03/30/2024 CLINICAL DATA:  Neuro deficit, acute, stroke suspected EXAM: MRI HEAD WITHOUT CONTRAST TECHNIQUE: Multiplanar, multiecho pulse sequences of the brain and surrounding structures were obtained without intravenous  contrast. COMPARISON:  CT earlier same day. FINDINGS: Brain: Diffusion imaging shows acute infarction within the left ventral pons. Question small focus of acute infarction in the deep white matter adjacent to the posterior body of the left lateral ventricle. No large vessel territory stroke. Chronic small-vessel ischemic changes otherwise affect the brainstem. No focal cerebellar insult. Cerebral hemispheres show old small vessel infarctions of the thalami and basal ganglia and moderate to severe chronic small-vessel ischemic change of the hemispheric white matter. No mass, acute hemorrhage, hydrocephalus or extra-axial  collection. There are a few punctate foci of hemosiderin deposition related to the old small vessel ischemic changes. Vascular: Major vessels at the base of the brain show flow. Skull and upper cervical spine: Negative Sinuses/Orbits: Inflammatory changes of both maxillary sinuses. Other sinuses are clear. Orbits negative. Other: None IMPRESSION: 1. Acute infarction in the left ventral pons. Question second small focus of acute infarction in the deep white matter adjacent to the posterior body of the left lateral ventricle. 2. Extensive chronic small-vessel ischemic changes elsewhere throughout the brain as outlined above. Electronically Signed   By: Oneil Officer M.D.   On: 03/30/2024 16:50   CT HEAD CODE STROKE WO CONTRAST Result Date: 03/30/2024 EXAM: CT HEAD WITHOUT 03/30/2024 11:52:00 AM TECHNIQUE: CT of the head was performed without the administration of intravenous contrast. Automated exposure control, iterative reconstruction, and/or weight based adjustment of the mA/kV was utilized to reduce the radiation dose to as low as reasonably achievable. COMPARISON: Temporal bone CT 12/20/2009. CLINICAL HISTORY: 68 year old male with acute neuro deficit, stroke suspected. FINDINGS: BRAIN AND VENTRICLES: No acute intracranial hemorrhage. No mass effect or midline shift. No extra-axial fluid  collection. No evidence of acute infarct. No hydrocephalus. Patchy and moderate asymmetric mostly periventricular white matter hypodensity in both hemispheres. Small age indeterminate right caudate lacunar infarct series 2 image 18. Similar small age indeterminate hypodensity in the central right thalamus on image 16. No suspicious intracranial vascular hyperdensity. On sagittal image 25 the caudate lacune appears circumscribed and chronic. Calcified atherosclerosis at the skull base. No gaze deviation. ORBITS: No acute abnormality. SINUSES AND MASTOIDS: Chronic appearing right maxillary sinus disease with mucoperiosteal thickening. Trace fluid in the left maxillary sinus. Other paranasal sinuses, middle ears and mastoids are well aerated. SOFT TISSUES AND SKULL: No acute skull fracture. No acute soft tissue abnormality. [place] Stroke Program Early CT Score (ASPECTS) ----- Ganglionic (caudate, ic, lentiform nucleus, insula, M1-m3): 7 Supraganglionic (m4-m6): 3 Total: 10 IMPRESSION: 1. No acute intracranial hemorrhage or cortically based infarct. ASPECTS 10. 2. Moderate for age small vessel disease, including age indeterminate involvement of the right thalamus. 3. These results were communicated to Dr Matthews at 11:59 hours on 03/30/2024 by text page via the [facility] messaging system. Electronically signed by: Helayne Hurst MD 03/30/2024 12:00 PM EST RP Workstation: [id number]     Discharge Instructions: Discharge Instructions     Call MD for:  difficulty breathing, headache or visual disturbances   Complete by: As directed    Call MD for:  extreme fatigue   Complete by: As directed    Call MD for:  persistant dizziness or light-headedness   Complete by: As directed    Call MD for:  severe uncontrolled pain   Complete by: As directed    Diet - low sodium heart healthy   Complete by: As directed    Discharge instructions   Complete by: As directed    Thank you for allowing us  to be part of your care. You  were hospitalized for a stroke. We treated you with medications and therapy  See the changes in your medications and management of your chronic conditions below:  *For your Stroke -We have STARTED you on these following medications:  -Zetia  (ezetimibe )--take 1 capsule once per day.  -Continue taking your other medications including:  -Aspirin   -Plavix  (clopidogrel )  -Amlodipine   -Lisinopril   -rosuvastatin  (crestor )  -Please see your PCP in 7 to 10 days  FOLLOW UP APPOINTMENTS: -Keep your PCP appointment on 12/2 at the [facility] -  You should also be contacted to arrange physical therapy that will come to your house.  -PLEASE CALL Dr. Lester at [phone] to set up a follow up.  -Please set up a follow up with neurology through the [facility].   Please call your PCP or our clinic if you have any questions or concerns, we may be able to help and keep you from a long and expensive emergency room wait. Our clinic and after hours phone number is [phone]. The best time to call is Monday through Friday 9 am to 4 pm but there is always someone available 24/7 if you have an emergency. If you need medication refills please notify your pharmacy one week in advance and they will send us  a request.   We are glad you are feeling better,  Schuyler Novak, DO Internal Medicine Inpatient Teaching Service at [facility]   Increase activity slowly   Complete by: As directed    No wound care   Complete by: As directed        Signed: Novak Schuyler, DO 04/01/2024, 7:05 PM

## 2024-04-01 NOTE — TOC CAGE-AID Note (Signed)
 Transition of Care [facility]) - CAGE-AID Screening   Patient Details  Name: Dennis Dominguez. MRN: [id number] Date of Birth: 10-Aug-1955  [facility] of [facility]) CM/SW Contact:    Konstantine Gervasi E Kelsye Loomer, LCSW Phone Number: 04/01/2024, 9:06 AM   Clinical Narrative: History of SA, denied current SA.   CAGE-AID Screening:    Have You Ever Felt You Ought to Cut Down on Your Drinking or Drug Use?: No Have People Annoyed You By Critizing Your Drinking Or Drug Use?: No Have You Felt Bad Or Guilty About Your Drinking Or Drug Use?: No Have You Ever Had a Drink or Used Drugs First Thing In The Morning to Steady Your Nerves or to Get Rid of a Hangover?: No CAGE-AID Score: 0  Substance Abuse Education Offered: No

## 2024-04-01 NOTE — Plan of Care (Signed)

## 2024-04-01 NOTE — Discharge Instructions (Addendum)
 Thank you for allowing us  to be part of your care. You were hospitalized for a stroke. We treated you with medications and therapy  See the changes in your medications and management of your chronic conditions below:  *For your Stroke -We have STARTED you on these following medications:  -Zetia  (ezetimibe )--take 1 capsule once per day.  -Continue taking your other medications including:  -Aspirin   -Plavix  (clopidogrel )  -Amlodipine   -Lisinopril   -rosuvastatin  (crestor )  -Please see your PCP in 7 to 10 days  FOLLOW UP APPOINTMENTS: -Keep your PCP appointment on 12/2 at the [facility] -You should also be contacted to arrange physical therapy that will come to your house.  -PLEASE CALL Dr. Lester at [phone] to set up a follow up.  -Please set up a follow up with neurology through the [facility].   Please call your PCP or our clinic if you have any questions or concerns, we may be able to help and keep you from a long and expensive emergency room wait. Our clinic and after hours phone number is [phone]. The best time to call is Monday through Friday 9 am to 4 pm but there is always someone available 24/7 if you have an emergency. If you need medication refills please notify your pharmacy one week in advance and they will send us  a request.   We are glad you are feeling better,  Schuyler Novak, DO Internal Medicine Inpatient Teaching Service at [facility]

## 2024-04-01 NOTE — Progress Notes (Signed)
 Physical Therapy Treatment Patient Details Name: Dennis Dominguez. MRN: [id number] DOB: 1955-09-23 Today's Date: 04/01/2024   History of Present Illness Pt is a 68 y.o. male presenting 11/23 with RUE weakness and slurred speech. 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 PMH: HTN, HLD, COPD, DM2, AAA, asthma, anxiety    PT Comments  Treatment today focused on gait training and fall management strategies. Pt reports having a fall last night in his room after losing his balance walking with the RW around his bed. He required assist to return to standing. Pt demonstrates good carryover with VC on R heel initial contact followed by toe off for improve R LE clearance during swing. Overall, gait appears steadier today with no bouts of staggering noted, and able to ambulate 2x 60' CGA with RW. Noted difficulty with dual tasking, as pt would frequently stop walking to answer questions from therapist. Able to navigate stairs, with further education on RW management and how his friend can assist him up his flight of stairs. Expressed understanding regarding fall safety strategies, as well as how to return to standing should he have a non-injurious fall. Continuing to recommend post-acute rehab >3hrs/day to improve safety with functional mobility, but if pt refuses, will benefit from [facility] PT. Acute PT to follow.     If plan is discharge home, recommend the following: A little help with walking and/or transfers;A little help with bathing/dressing/bathroom;Assistance with cooking/housework;Assist for transportation;Help with stairs or ramp for entrance   Can travel by private vehicle        Equipment Recommendations  Rolling walker (2 wheels);BSC/3in1    Recommendations for Other Services Rehab consult     Precautions / Restrictions Precautions Precautions: Fall Recall of Precautions/Restrictions: Intact Restrictions Weight Bearing Restrictions Per Provider Order: No     Mobility  Bed  Mobility               General bed mobility comments: Received and returned to sitting in chair    Transfers Overall transfer level: Needs assistance Equipment used: Rolling walker (2 wheels) Transfers: Sit to/from Stand Sit to Stand: Contact guard assist           General transfer comment: CGA for safety. Good technique in pushing up from handrails of chair    Ambulation/Gait Ambulation/Gait assistance: Contact guard assist Gait Distance (Feet): 60 Feet (x2) Assistive device: Rolling walker (2 wheels) Gait Pattern/deviations: Decreased stride length, Decreased dorsiflexion - right, Trunk flexed, Shuffle Gait velocity: Dec Gait velocity interpretation: 1.31 - 2.62 ft/sec, indicative of limited community ambulator   General Gait Details: Focused on R heel initial contact and VC for clearing R foot during swing. Pt able to carry through well. Gait overall more steady today, no bouts of stumbling. Cues for RW proximity and good posture with good carryover.   Stairs Stairs: Yes Stairs assistance: Contact guard assist Stair Management: Step to pattern, Forwards, Two rails Number of Stairs: 2 General stair comments: Cues for advancement of hands forward on rails during descension. Demosntrates slightly improved eccentric control with L LE during descension. Discussed RW management with assist of friend to bring RW up and down stairs for patient   Wheelchair Mobility     Tilt Bed    Modified Rankin (Stroke Patients Only) Modified Rankin (Stroke Patients Only) Pre-Morbid Rankin Score: No symptoms Modified Rankin: Moderately severe disability     Balance Overall balance assessment: Needs assistance Sitting-balance support: No upper extremity supported, Feet supported Sitting balance-Leahy Scale: Fair  Standing balance support: Bilateral upper extremity supported, During functional activity, Reliant on assistive device for balance Standing balance-Leahy Scale:  Poor Standing balance comment: Reliant on external support                            Communication Communication Communication: Impaired Factors Affecting Communication: Reduced clarity of speech  Cognition Arousal: Alert Behavior During Therapy: WFL for tasks assessed/performed   PT - Cognitive impairments: Safety/Judgement                       PT - Cognition Comments: Pt requiring multimodal cueing for fall prevention education and demonstrated difficulty with teachback Following commands: Impaired Following commands impaired: Follows multi-step commands inconsistently    Cueing Cueing Techniques: Verbal cues, Gestural cues  Exercises      General Comments General comments (skin integrity, edema, etc.): Discussed fall prevention and management strategies including having phone on him 24/7 and charged, as well as strategies to stand up if he falls at home without someone to help him      Pertinent Vitals/Pain Pain Assessment Pain Assessment: Faces Faces Pain Scale: No hurt Pain Intervention(s): Monitored during session    Home Living                          Prior Function            PT Goals (current goals can now be found in the care plan section) Acute Rehab PT Goals Patient Stated Goal: to go home PT Goal Formulation: With patient Time For Goal Achievement: 04/14/24 Potential to Achieve Goals: Good Progress towards PT goals: Progressing toward goals    Frequency    Min 3X/week      PT Plan      Co-evaluation              AM-PAC PT 6 Clicks Mobility   Outcome Measure  Help needed turning from your back to your side while in a flat bed without using bedrails?: A Little Help needed moving from lying on your back to sitting on the side of a flat bed without using bedrails?: A Little Help needed moving to and from a bed to a chair (including a wheelchair)?: A Little Help needed standing up from a chair using your  arms (e.g., wheelchair or bedside chair)?: A Little Help needed to walk in hospital room?: A Little Help needed climbing 3-5 steps with a railing? : A Little 6 Click Score: 18    End of Session Equipment Utilized During Treatment: Gait belt Activity Tolerance: Patient tolerated treatment well Patient left: in chair;with call bell/phone within reach;with chair alarm set Nurse Communication: Mobility status PT Visit Diagnosis: Unsteadiness on feet (R26.81);Other abnormalities of gait and mobility (R26.89);Difficulty in walking, not elsewhere classified (R26.2)     Time: 9179-9155 PT Time Calculation (min) (ACUTE ONLY): 24 min  Charges:    $Therapeutic Activity: 23-37 mins PT General Charges $$ ACUTE PT VISIT: 1 Visit                     Rollo Farquhar, SPT    Kaelyn Nauta 04/01/2024, 10:59 AM

## 2024-04-02 LAB — VITAMIN B1: Vitamin B1 (Thiamine): 119.1 nmol/L (ref 66.5–200.0)

## 2024-04-04 ENCOUNTER — Telehealth: Payer: Self-pay

## 2024-04-04 DIAGNOSIS — I7143 Infrarenal abdominal aortic aneurysm, without rupture: Secondary | ICD-10-CM

## 2024-04-04 LAB — METHYLMALONIC ACID, SERUM: Methylmalonic Acid, Quantitative: 151 nmol/L (ref 0–378)

## 2024-04-04 NOTE — Transitions of Care (Post Inpatient/ED Visit) (Signed)
 Stroke Discharge Follow-up   04/04/2024 Name:  Dennis Dominguez. MRN:  [id number] DOB:  Mar 07, 1956  Subjective: Dennis Dominguez. is a 68 y.o. year old male who is a primary care patient of [name] An Emmi alert was received indicating patient responded to questions: Problems setting up rehab? Questions/problems with meds? Scheduled a follow-up appointment?. I reached out by phone to follow up on the alert and spoke to Patient. Patient states he is doing okay.  He states he thought he had more strength but he is managing.  He states he did not want to go to rehab.  He states he could not answer the automated call.  He denies problems with medication.  Reviewed medications.  He states that he has been in contact with [facility] child psychotherapist and working on follow up visit. He has not heard from [facility] but has the contact information on his discharge summary.  CM did call [name] Sermon with [facility] they have the order just waiting for [facility] authorization.    Care Management Interventions:   -Discussed medications with patient and reviewed medication.   -Discussed [facility] with [facility].  -Discussed home health with patient.    -Call to follow up about home health order with [facility].   -Discussed stroke symptoms and getting help immediately, if they do reoccur.    Follow up plan: No further intervention required.   Kerby Hockley J. Ceyda Peterka RN, MSN [facility], [facility] RN Care Manager Direct Dial: [phone]  Fax: [phone] Website: [email or web address]

## 2024-04-07 ENCOUNTER — Telehealth: Payer: Self-pay

## 2024-04-07 DIAGNOSIS — I7143 Infrarenal abdominal aortic aneurysm, without rupture: Secondary | ICD-10-CM

## 2024-04-07 NOTE — Transitions of Care (Post Inpatient/ED Visit) (Signed)
 Stroke Discharge Follow-up   04/07/2024 Name:  Dennis Dominguez. MRN:  [id number] DOB:  Oct 05, 1955  Subjective: Dennis Paci. is a 68 y.o. year old male who is a primary care patient of [name] An Emmi alert was received indicating patient responded to questions: Know how/when to take meds? Problems setting up rehab? Scheduled a follow-up appointment?. I reached out by phone to follow up on the alert and spoke to Patient.  Patient reports that he has all his medications and is taking as prescribed. Reports that he has follow up planned at the [facility] on 04/25/2024.  Pending set of therapy services. Spoke with Jasmine at [facility] who confirms they has requested authorization on 11/26 and today as well. Pending response from [facility].   Care Management Interventions: (1) reviewed that patient is taking his medications as prescribed (2) confirmed patient has hospital followup at the [facility] on 04/25/2004 (3) Call to [facility] to confirm patient is pending insurance approval.   Follow up plan: No further intervention required.    Alan Ee, RN, BSN, CEN [employer]- Transition of Care Team.  [facility] [phone]

## 2024-04-13 ENCOUNTER — Emergency Department (HOSPITAL_COMMUNITY)

## 2024-04-13 ENCOUNTER — Other Ambulatory Visit: Payer: Self-pay

## 2024-04-13 ENCOUNTER — Encounter (HOSPITAL_COMMUNITY): Payer: Self-pay | Admitting: Emergency Medicine

## 2024-04-13 ENCOUNTER — Emergency Department (HOSPITAL_COMMUNITY)
Admission: EM | Admit: 2024-04-13 | Discharge: 2024-04-14 | Discharge status: Against Medical Advice | Attending: Emergency Medicine | Admitting: Emergency Medicine

## 2024-04-13 DIAGNOSIS — R2689 Other abnormalities of gait and mobility: Secondary | ICD-10-CM | POA: Diagnosis present

## 2024-04-13 DIAGNOSIS — Z7984 Long term (current) use of oral hypoglycemic drugs: Secondary | ICD-10-CM | POA: Insufficient documentation

## 2024-04-13 DIAGNOSIS — E119 Type 2 diabetes mellitus without complications: Secondary | ICD-10-CM | POA: Diagnosis not present

## 2024-04-13 DIAGNOSIS — J4489 Other specified chronic obstructive pulmonary disease: Secondary | ICD-10-CM | POA: Diagnosis not present

## 2024-04-13 DIAGNOSIS — Z79899 Other long term (current) drug therapy: Secondary | ICD-10-CM | POA: Diagnosis not present

## 2024-04-13 DIAGNOSIS — Z7982 Long term (current) use of aspirin: Secondary | ICD-10-CM | POA: Diagnosis not present

## 2024-04-13 DIAGNOSIS — R531 Weakness: Secondary | ICD-10-CM | POA: Insufficient documentation

## 2024-04-13 DIAGNOSIS — Z5329 Procedure and treatment not carried out because of patient's decision for other reasons: Secondary | ICD-10-CM | POA: Insufficient documentation

## 2024-04-13 DIAGNOSIS — Z8673 Personal history of transient ischemic attack (TIA), and cerebral infarction without residual deficits: Secondary | ICD-10-CM | POA: Insufficient documentation

## 2024-04-13 DIAGNOSIS — I10 Essential (primary) hypertension: Secondary | ICD-10-CM | POA: Diagnosis not present

## 2024-04-13 DIAGNOSIS — R262 Difficulty in walking, not elsewhere classified: Secondary | ICD-10-CM

## 2024-04-13 LAB — CBC
HCT: 44.4 % (ref 39.0–52.0)
Hemoglobin: 14.4 g/dL (ref 13.0–17.0)
MCH: 27.7 pg (ref 26.0–34.0)
MCHC: 32.4 g/dL (ref 30.0–36.0)
MCV: 85.4 fL (ref 80.0–100.0)
Platelets: 387 K/uL (ref 150–400)
RBC: 5.2 MIL/uL (ref 4.22–5.81)
RDW: 14.7 % (ref 11.5–15.5)
WBC: 11.1 K/uL — ABNORMAL HIGH (ref 4.0–10.5)
nRBC: 0 % (ref 0.0–0.2)

## 2024-04-13 LAB — COMPREHENSIVE METABOLIC PANEL WITH GFR
ALT: 36 U/L (ref 0–44)
AST: 27 U/L (ref 15–41)
Albumin: 3.8 g/dL (ref 3.5–5.0)
Alkaline Phosphatase: 51 U/L (ref 38–126)
Anion gap: 11 (ref 5–15)
BUN: 12 mg/dL (ref 8–23)
CO2: 21 mmol/L — ABNORMAL LOW (ref 22–32)
Calcium: 9.1 mg/dL (ref 8.9–10.3)
Chloride: 104 mmol/L (ref 98–111)
Creatinine, Ser: 0.89 mg/dL (ref 0.61–1.24)
GFR, Estimated: >60 mL/min (ref >60)
Glucose, Bld: 106 mg/dL — ABNORMAL HIGH (ref 70–99)
Potassium: 4.2 mmol/L (ref 3.5–5.1)
Sodium: 136 mmol/L (ref 135–145)
Total Bilirubin: 0.7 mg/dL (ref 0.0–1.2)
Total Protein: 7.8 g/dL (ref 6.5–8.1)

## 2024-04-13 LAB — TROPONIN I (HIGH SENSITIVITY): Troponin I (High Sensitivity): 6 ng/L (ref <18)

## 2024-04-13 LAB — CBG MONITORING, ED
Glucose-Capillary: 103 mg/dL — ABNORMAL HIGH (ref 70–99)
Glucose-Capillary: 194 mg/dL — ABNORMAL HIGH (ref 70–99)

## 2024-04-13 LAB — PROTIME-INR
INR: 1 (ref 0.8–1.2)
Prothrombin Time: 13.7 s (ref 11.4–15.2)

## 2024-04-13 LAB — MAGNESIUM: Magnesium: 1.7 mg/dL (ref 1.7–2.4)

## 2024-04-13 MED ORDER — ROSUVASTATIN CALCIUM 20 MG PO TABS
40.0000 mg | ORAL_TABLET | Freq: Every day | ORAL | Status: DC
  Administered 2024-04-13: 40 mg via ORAL
  Filled 2024-04-13: qty 2

## 2024-04-13 MED ORDER — AMLODIPINE BESYLATE 5 MG PO TABS
5.0000 mg | ORAL_TABLET | Freq: Every day | ORAL | Status: DC
  Administered 2024-04-13: 5 mg via ORAL
  Filled 2024-04-13: qty 1

## 2024-04-13 MED ORDER — METFORMIN HCL 500 MG PO TABS
1000.0000 mg | ORAL_TABLET | Freq: Two times a day (BID) | ORAL | Status: DC
  Administered 2024-04-13: 1000 mg via ORAL
  Filled 2024-04-13: qty 2

## 2024-04-13 MED ORDER — ALBUTEROL SULFATE HFA 108 (90 BASE) MCG/ACT IN AERS: 2.0000 | INHALATION_SPRAY | Freq: Four times a day (QID) | RESPIRATORY_TRACT | Status: DC | PRN

## 2024-04-13 MED ORDER — LISINOPRIL 20 MG PO TABS
40.0000 mg | ORAL_TABLET | Freq: Every day | ORAL | Status: DC
  Administered 2024-04-13: 40 mg via ORAL
  Filled 2024-04-13: qty 2

## 2024-04-13 MED ORDER — ASPIRIN 81 MG PO TBEC: 162.0000 mg | DELAYED_RELEASE_TABLET | Freq: Every day | ORAL | Status: DC

## 2024-04-13 MED ORDER — EZETIMIBE 10 MG PO TABS
10.0000 mg | ORAL_TABLET | Freq: Every day | ORAL | Status: DC
  Administered 2024-04-13: 10 mg via ORAL
  Filled 2024-04-13: qty 1

## 2024-04-13 MED ORDER — CLOPIDOGREL BISULFATE 75 MG PO TABS
75.0000 mg | ORAL_TABLET | Freq: Every day | ORAL | Status: DC
  Administered 2024-04-13: 75 mg via ORAL
  Filled 2024-04-13: qty 1

## 2024-04-13 NOTE — ED Provider Notes (Signed)
 [place] EMERGENCY DEPARTMENT AT [facility] Provider Note   CSN: [id number] Arrival date & time: 04/13/24  1252     Patient presents with: Weakness   Dennis Dominguez. is a 68 y.o. male.   Pt is a 68 yo male with pmhx significant for asthma, hld, copd, htn, dm, arthritis, and hx AAA.  Pt was admitted from 11/23-25 for a CVA.  Dennis Dominguez thought Dennis Dominguez'd be ok at home, but has not been doing well.  Dennis Dominguez lives alone.  Dennis Dominguez's not able to make his food well.  Dennis Dominguez still has weakness to his right side.  It looks like home health was set up at d/c, but no one has come out yet.  Dennis Dominguez does not think Dennis Dominguez can make it on his own any more.  Dennis Dominguez's not been able to bathe.  Dennis Dominguez's not eaten all day.  Dennis Dominguez tried to go home because Dennis Dominguez has a cat at home and did not want to leave the cat.       Prior to Admission medications   Medication Sig Start Date End Date Taking? Authorizing Provider  albuterol  (PROVENTIL  HFA;VENTOLIN  HFA) 108 (90 BASE) MCG/ACT inhaler Inhale 2 puffs into the lungs every 6 (six) hours as needed for wheezing or shortness of breath.     [provider]  amitriptyline  (ELAVIL ) 50 MG tablet Take 50 mg by mouth at bedtime. 04/25/23   [provider]  amLODipine  (NORVASC ) 5 MG tablet Take 5 mg by mouth daily. 03/27/24   [provider]  aspirin  EC 81 MG tablet Take 162 mg by mouth daily with breakfast.    [provider]  cholecalciferol (VITAMIN D3) 25 MCG (1000 UNIT) tablet Take 1,000 Units by mouth daily.    [provider]  clopidogrel  (PLAVIX ) 75 MG tablet Take 1 tablet (75 mg total) by mouth daily. 03/20/24   Gretta Lonni PARAS, MD  ezetimibe  (ZETIA ) 10 MG tablet Take 1 tablet (10 mg total) by mouth daily. 04/02/24   King, Olivia, DO  lisinopril  (ZESTRIL ) 40 MG tablet Take 40 mg by mouth daily. 02/06/24   [provider]  metFORMIN  (GLUCOPHAGE ) 1000 MG tablet Take 1,000 mg by mouth 2 (two) times daily with a meal. 04/25/23   [provider]  rosuvastatin  (CRESTOR ) 40 MG tablet Take 40 mg by mouth daily. 03/27/24   [provider]    Allergies: Penicillin g, Penicillins, and Bactrim  [sulfamethoxazole -trimethoprim ]    Review of Systems  Neurological:  Positive for weakness.  All other systems reviewed and are negative.   Updated Vital Signs BP (!) 159/90 (BP Location: Left Arm)   Pulse 93   Temp 98.3 F (36.8 C)   Resp 16   Ht 5' 9 (1.753 m)   Wt 108 kg   SpO2 100%   BMI 35.16 kg/m   Physical Exam Vitals and nursing note reviewed.  Constitutional:      Appearance: Normal appearance. Dennis Dominguez is obese.  HENT:     Head: Normocephalic and atraumatic.     Right Ear: External ear normal.     Left Ear: External ear normal.     Nose: Nose normal.     Mouth/Throat:     Mouth: Mucous membranes are moist.     Pharynx: Oropharynx is clear.  Eyes:     Extraocular Movements: Extraocular movements intact.     Conjunctiva/sclera: Conjunctivae normal.     Pupils: Pupils are equal, round, and reactive to light.  Cardiovascular:  Rate and Rhythm: Normal rate and regular rhythm.     Pulses: Normal pulses.     Heart sounds: Normal heart sounds.  Pulmonary:     Effort: Pulmonary effort is normal.     Breath sounds: Normal breath sounds.  Abdominal:     General: Abdomen is flat. Bowel sounds are normal.     Palpations: Abdomen is soft.  Musculoskeletal:        General: Normal range of motion.     Cervical back: Normal range of motion and neck supple.  Skin:    General: Skin is warm.     Capillary Refill: Capillary refill takes less than 2 seconds.  Neurological:     Mental Status: Dennis Dominguez is alert and oriented to person, place, and time.     Comments: Right sided weakness  Psychiatric:        Mood and Affect: Mood normal.        Behavior: Behavior normal.     (all labs ordered are listed, but only abnormal results are displayed) Labs Reviewed  COMPREHENSIVE METABOLIC PANEL WITH GFR - Abnormal;  Notable for the following components:      Result Value   CO2 21 (*)    Glucose, Bld 106 (*)    All other components within normal limits  CBC - Abnormal; Notable for the following components:   WBC 11.1 (*)    All other components within normal limits  CBG MONITORING, ED - Abnormal; Notable for the following components:   Glucose-Capillary 103 (*)    All other components within normal limits  PROTIME-INR  URINALYSIS, ROUTINE W REFLEX MICROSCOPIC  MAGNESIUM   CBG MONITORING, ED  TROPONIN I (HIGH SENSITIVITY)    EKG: EKG Interpretation Date/Time:  Sunday April 13 2024 13:03:49 EST Ventricular Rate:  86 PR Interval:  130 QRS Duration:  120 QT Interval:  368 QTC Calculation: 440 R Axis:   -36  Text Interpretation: Sinus rhythm with Premature supraventricular complexes Left axis deviation Right bundle branch block Abnormal ECG When compared with ECG of 30-Mar-2024 13:55, PREVIOUS ECG IS PRESENT No significant change since last tracing Confirmed by Dean Clarity [phone]) on 04/13/2024 3:32:29 PM  Radiology: CT Head Wo Contrast Result Date: 04/13/2024 EXAM: CT HEAD WITHOUT CONTRAST 04/13/2024 02:12:43 PM TECHNIQUE: CT of the head was performed without the administration of intravenous contrast. Automated exposure control, iterative reconstruction, and/or weight based adjustment of the mA/kV was utilized to reduce the radiation dose to as low as reasonably achievable. COMPARISON: 03/30/2024 CLINICAL HISTORY: Delirium; fatigue, post stroke FINDINGS: BRAIN AND VENTRICLES: No acute hemorrhage. No evidence of acute infarct. Stable chronic small vessel ischemia with patchy periventricular and deep white matter hypodensity. Remote lacunar infarct in left basal ganglia. Recent left ventral pontine infarct on prior MRI not visualized on CT. Remote lacunar infarcts are again noted within the thalami bilaterally. A remote lacunar infarct is present in the posterior right caudate. Atherosclerosis of  skullbase vasculature without hyperdense vessel or abnormal calcification. No hydrocephalus. No extra-axial collection. No mass effect or midline shift. ORBITS: Bilateral lens replacements are noted. The globes and orbits are otherwise within normal limits. SINUSES: Chronic opacification of the right maxillary sinus. SOFT TISSUES AND SKULL: No acute soft tissue abnormality. No skull fracture. IMPRESSION: 1. No acute findings. 2. Recent left ventral pontine infarct on prior MRI not visualized on CT. 3. Stable chronic small vessel ischemia with patchy periventricular and deep white matter hypodensity. 4. Remote lacunar infarcts in the left basal ganglia, thalami  bilaterally, and posterior right caudate. Electronically signed by: Lonni Necessary MD 04/13/2024 02:25 PM EST RP Workstation: [id number]   DG Chest 2 View Result Date: 04/13/2024 CLINICAL DATA:  Fatigue and generalized weakness.  Recent stroke. EXAM: CHEST - 2 VIEW COMPARISON:  Chest x-ray 08/27/2023 FINDINGS: The heart is borderline enlarged but stable. Stable tortuosity and ectasia of the thoracic aorta. Chronic bronchitic type interstitial lung changes but no focal infiltrates, edema or effusions. No pneumothorax. The bony thorax ileal is intact. IMPRESSION: Chronic bronchitic type interstitial lung changes but no acute pulmonary findings. Electronically Signed   By: MYRTIS Stammer M.D.   On: 04/13/2024 14:25     Procedures   Medications Ordered in the ED  albuterol  (VENTOLIN  HFA) 108 (90 Base) MCG/ACT inhaler 2 puff (has no administration in time range)  amLODipine  (NORVASC ) tablet 5 mg (has no administration in time range)  aspirin  EC tablet 162 mg (has no administration in time range)  clopidogrel  (PLAVIX ) tablet 75 mg (has no administration in time range)  ezetimibe  (ZETIA ) tablet 10 mg (has no administration in time range)  lisinopril  (ZESTRIL ) tablet 40 mg (has no administration in time range)  metFORMIN  (GLUCOPHAGE ) tablet 1,000  mg (has no administration in time range)  rosuvastatin  (CRESTOR ) tablet 40 mg (has no administration in time range)                                    Medical Decision Making Amount and/or Complexity of Data Reviewed Labs: ordered.  Risk OTC drugs. Prescription drug management.   This patient presents to the ED for concern of weakness, this involves an extensive number of treatment options, and is a complaint that carries with it a high risk of complications and morbidity.  The differential diagnosis includes new stroke, electrolyte abn, infection   Co morbidities that complicate the patient evaluation  asthma, hld, copd, htn, dm, arthritis, and hx AAA   Additional history obtained:  Additional history obtained from epic chart review  Lab Tests:  I Ordered, and personally interpreted labs.  The pertinent results include:  cbc nl, cmp nl, inr nl   Imaging Studies ordered:  I ordered imaging studies including ct head, cxr  I independently visualized and interpreted imaging which showed  CT head: . No acute findings.  2. Recent left ventral pontine infarct on prior MRI not visualized on CT.  3. Stable chronic small vessel ischemia with patchy periventricular and deep  white matter hypodensity.  4. Remote lacunar infarcts in the left basal ganglia, thalami bilaterally, and  posterior right caudate.  CXR: Chronic bronchitic type interstitial lung changes but no acute  pulmonary findings.   I agree with the radiologist interpretation   Medicines ordered and prescription drug management:   I have reviewed the patients home medicines and have made adjustments as needed   Test Considered:  ct  Consultations Obtained:  I requested consultation with TOC/PT,  and discussed lab and imaging findings as well as pertinent plan - pending   Problem List / ED Course:  Ambulatory dysfunction and FTT:  nothing new today.  I have consulted TOC for  placement.   Reevaluation:  After the interventions noted above, I reevaluated the patient and found that they have :improved   Social Determinants of Health:  Lives alone   Dispostion:  Pending TOC consult     Final diagnoses:  History of stroke  Ambulatory dysfunction  ED Discharge Orders     None          Dean Clarity, MD 04/13/24 1615

## 2024-04-13 NOTE — ED Notes (Signed)
 Contacted diplomatic services operational officer to order meal tray

## 2024-04-13 NOTE — Care Management (Addendum)
 Transition of Care [facility]) - Emergency Department Mini Assessment   Patient Details  Name: Alban Marucci. MRN: [id number] Date of Birth: 05/27/1955  Transition of Care [facility]) CM/SW Contact:    Corean JAYSON Canary, RN Phone Number: 04/13/2024, 5:15 PM   Clinical Narrative:  Patient was discharged to home on November 25 with home health due to the patents refusal of CIR which was recommended.  The patient has [employer] , and forms were sent to the [place]. Called Cory from [facility] who is the patients home health agency> It is likely that the [place] has not send authorization yet. He will check with the office.  A PT consult was place the patient will board in the ED to await re-evaluation from PT 1720 called and psoke to the patient via phone introduced self, and explained what happened with home health.  Asked him if he would be amenable to SNF if it was recommended. He stated  if it is recommended.  Then he states that  I've been here for 2 hours and nobody knows what is going on he proceeds to tell this RNCM he is leaving and  hung up the phone.   Charge RN, Nursing and MD made aware of this exchange ED Mini Assessment: What brought you to the Emergency Department? : cannot take care of self at home           Interventions which prevented an admission or readmission: Home Health Consult or Services    Patient Contact and Communications        ,                 Admission diagnosis:  weakness Patient Active Problem List   Diagnosis Date Noted   Tobacco use disorder 03/31/2024   CVA (cerebral vascular accident) (HCC) 03/31/2024   Left pontine stroke (HCC) 03/30/2024   H/O endovascular stent graft for abdominal aortic aneurysm 03/30/2024   Anxiety 03/30/2024   AAA (abdominal aortic aneurysm) 08/27/2023   Chronic obstructive pulmonary disease (HCC) 08/21/2023   Hyperlipidemia 08/21/2023   Major depressive disorder 08/21/2023   Alcohol dependence (HCC) 08/21/2023    Essential hypertension 08/21/2023   Chronic neck pain 08/21/2023   Homelessness unspecified 08/21/2023   AAA (abdominal aortic aneurysm) without rupture 08/21/2023   Type 2 diabetes mellitus (HCC) 09/15/2019   Vitamin D deficiency 09/15/2019   PCP:  [name] Pharmacy:   [facility] [phone] - [place], [place] - [address] AT [facility] [address] [facility] [address] [place] [address] Phone: [phone] Fax: [phone]  [name] [address] [place] [address] Phone: [phone] Fax: [phone]

## 2024-04-13 NOTE — ED Notes (Signed)
Pt aware a urine sample is needed. Urinal at bedside. 

## 2024-04-13 NOTE — ED Triage Notes (Signed)
 Pt arrives via [facility] from home with reports of generalized weakness for a month since stroke. Denies any pain but reports dizziness with the weakness. Also reports he tripped this morning and fell on his side.

## 2024-04-13 NOTE — ED Notes (Signed)
 Pt provided with urine cup and instructed to collect urine sample since pt did not have to urinate right now. Pt acknowledges.

## 2024-04-13 NOTE — ED Provider Triage Note (Cosign Needed Addendum)
 Emergency Medicine Provider Triage Evaluation Note  Dennis Dominguez. , a 68 y.o. male  was evaluated in triage.  Pt complains of generalized weakness. Denies any vision changes no sob. States I have some CP sometimes non currently. I felt weaker and weaker each day this week   Review of Systems  Positive: fatigue Negative: Fever   Physical Exam  BP (!) 159/90 (BP Location: Left Arm)   Pulse 93   Temp 98.3 F (36.8 C)   Resp 16   Ht 5' 9 (1.753 m)   Wt 108 kg   SpO2 100%   BMI 35.16 kg/m  Gen:   Awake, no distress   Resp:  Normal effort  MSK:   Moves extremities without difficulty  Other:    Alert and oriented to self, place, time and event.   Speech is fluent, clear without dysarthria or dysphasia.   Strength 5/5 in upper/lower extremities   Sensation intact in upper/lower extremities    CN I not tested  CN II grossly intact visual fields bilaterally. Did not visualize posterior eye.  CN III, IV, VI PERRLA and EOMs intact bilaterally  CN V Intact sensation to sharp and light touch to the face  CN VII facial movements symmetric  CN VIII not tested  CN IX, X no uvula deviation, symmetric rise of soft palate  CN XI 5/5 SCM and trapezius strength bilaterally  CN XII Midline tongue protrusion, symmetric L/R movements    Medical Decision Making  Medically screening exam initiated at 1:17 PM.  Appropriate orders placed.  Dennis Dominguez. was informed that the remainder of the evaluation will be completed by another provider, this initial triage assessment does not replace that evaluation, and the importance of remaining in the ED until their evaluation is complete.  Labs, CT head, EKG, Dennis Dominguez, [place] 04/13/24 1319    Neldon Hamp [place], [place] 04/13/24 1321

## 2024-04-14 NOTE — ED Notes (Addendum)
 Returned from taking another patient upstairs at this time and noticed that the pt was not in his room. Blankets were on the floor and pt is not in any of the nearby bathrooms. Charge and security notified and security to look for the patient.  Witnessed the patient ambulate independently to the bathroom multiple times throughout the night, it has been oriented and denied any needs when asked.

## 2024-09-02 ENCOUNTER — Ambulatory Visit: Admitting: Vascular Surgery

## 2024-09-02 ENCOUNTER — Ambulatory Visit (HOSPITAL_COMMUNITY)
